# Patient Record
Sex: Female | Born: 1935 | Race: White | Hispanic: No | Marital: Married | Smoking: Never smoker
Health system: Southern US, Community
[De-identification: ages and names within clinical notes are randomized; demographics above are authoritative.]

## PROBLEM LIST (undated history)

## (undated) DIAGNOSIS — I5189 Other ill-defined heart diseases: Secondary | ICD-10-CM

## (undated) DIAGNOSIS — I2510 Atherosclerotic heart disease of native coronary artery without angina pectoris: Secondary | ICD-10-CM

## (undated) DIAGNOSIS — Z7901 Long term (current) use of anticoagulants: Secondary | ICD-10-CM

## (undated) DIAGNOSIS — I252 Old myocardial infarction: Secondary | ICD-10-CM

## (undated) DIAGNOSIS — E785 Hyperlipidemia, unspecified: Secondary | ICD-10-CM

## (undated) DIAGNOSIS — I272 Pulmonary hypertension, unspecified: Secondary | ICD-10-CM

## (undated) DIAGNOSIS — R609 Edema, unspecified: Secondary | ICD-10-CM

## (undated) DIAGNOSIS — Z9229 Personal history of other drug therapy: Secondary | ICD-10-CM

## (undated) DIAGNOSIS — C4490 Unspecified malignant neoplasm of skin, unspecified: Secondary | ICD-10-CM

## (undated) DIAGNOSIS — Z951 Presence of aortocoronary bypass graft: Secondary | ICD-10-CM

## (undated) DIAGNOSIS — I480 Paroxysmal atrial fibrillation: Secondary | ICD-10-CM

## (undated) HISTORY — DX: Paroxysmal atrial fibrillation: I48.0

## (undated) HISTORY — DX: Edema, unspecified: R60.9

## (undated) HISTORY — DX: Personal history of other drug therapy: Z92.29

## (undated) HISTORY — DX: Other ill-defined heart diseases: I51.89

## (undated) HISTORY — DX: Old myocardial infarction: I25.2

## (undated) HISTORY — DX: Atherosclerotic heart disease of native coronary artery without angina pectoris: I25.10

## (undated) HISTORY — DX: Hyperlipidemia, unspecified: E78.5

## (undated) HISTORY — DX: Pulmonary hypertension, unspecified: I27.20

## (undated) HISTORY — DX: Long term (current) use of anticoagulants: Z79.01

## (undated) HISTORY — DX: Unspecified malignant neoplasm of skin, unspecified: C44.90

## (undated) HISTORY — DX: Presence of aortocoronary bypass graft: Z95.1

---

## 1959-04-06 HISTORY — PX: TUBAL LIGATION: SHX77

## 1993-04-05 DIAGNOSIS — I252 Old myocardial infarction: Secondary | ICD-10-CM

## 1993-04-05 HISTORY — DX: Old myocardial infarction: I25.2

## 1993-04-05 HISTORY — PX: CARDIAC CATHETERIZATION: SHX172

## 1997-04-05 HISTORY — PX: OTHER SURGICAL HISTORY: SHX169

## 1997-07-06 ENCOUNTER — Encounter (HOSPITAL_COMMUNITY): Admission: RE | Admit: 1997-07-06 | Discharge: 1997-10-04 | Payer: Self-pay | Admitting: Cardiology

## 1998-04-05 DIAGNOSIS — Z951 Presence of aortocoronary bypass graft: Secondary | ICD-10-CM

## 1998-04-05 HISTORY — DX: Presence of aortocoronary bypass graft: Z95.1

## 1998-04-05 HISTORY — PX: CORONARY ARTERY BYPASS GRAFT: SHX141

## 1998-04-24 ENCOUNTER — Other Ambulatory Visit: Admission: RE | Admit: 1998-04-24 | Discharge: 1998-04-24 | Payer: Self-pay | Admitting: *Deleted

## 1998-05-08 ENCOUNTER — Inpatient Hospital Stay (HOSPITAL_COMMUNITY): Admission: AD | Admit: 1998-05-08 | Discharge: 1998-05-14 | Payer: Self-pay | Admitting: Cardiology

## 1998-05-08 ENCOUNTER — Encounter: Payer: Self-pay | Admitting: Cardiology

## 1998-05-09 ENCOUNTER — Encounter: Payer: Self-pay | Admitting: Cardiology

## 1998-05-10 ENCOUNTER — Encounter: Payer: Self-pay | Admitting: Cardiology

## 1998-05-11 ENCOUNTER — Encounter: Payer: Self-pay | Admitting: Cardiothoracic Surgery

## 1998-05-12 ENCOUNTER — Encounter: Payer: Self-pay | Admitting: Cardiothoracic Surgery

## 1998-06-02 ENCOUNTER — Encounter: Admission: RE | Admit: 1998-06-02 | Discharge: 1998-08-31 | Payer: Self-pay | Admitting: Cardiology

## 1999-04-07 ENCOUNTER — Encounter (HOSPITAL_COMMUNITY): Admission: RE | Admit: 1999-04-07 | Discharge: 1999-07-06 | Payer: Self-pay | Admitting: Cardiology

## 1999-05-07 ENCOUNTER — Encounter: Admission: RE | Admit: 1999-05-07 | Discharge: 1999-05-07 | Payer: Self-pay | Admitting: *Deleted

## 1999-05-07 ENCOUNTER — Encounter: Payer: Self-pay | Admitting: *Deleted

## 1999-08-20 ENCOUNTER — Encounter: Admission: RE | Admit: 1999-08-20 | Discharge: 1999-11-18 | Payer: Self-pay | Admitting: Cardiology

## 2000-05-09 ENCOUNTER — Encounter: Admission: RE | Admit: 2000-05-09 | Discharge: 2000-05-09 | Payer: Self-pay | Admitting: *Deleted

## 2000-05-09 ENCOUNTER — Encounter: Payer: Self-pay | Admitting: *Deleted

## 2001-06-20 ENCOUNTER — Encounter: Payer: Self-pay | Admitting: *Deleted

## 2001-06-20 ENCOUNTER — Encounter: Admission: RE | Admit: 2001-06-20 | Discharge: 2001-06-20 | Payer: Self-pay | Admitting: *Deleted

## 2001-07-12 ENCOUNTER — Observation Stay (HOSPITAL_COMMUNITY): Admission: EM | Admit: 2001-07-12 | Discharge: 2001-07-13 | Payer: Self-pay

## 2001-07-12 ENCOUNTER — Encounter: Payer: Self-pay | Admitting: Emergency Medicine

## 2002-07-31 ENCOUNTER — Other Ambulatory Visit: Admission: RE | Admit: 2002-07-31 | Discharge: 2002-07-31 | Payer: Self-pay | Admitting: *Deleted

## 2002-08-06 ENCOUNTER — Encounter: Payer: Self-pay | Admitting: Cardiology

## 2002-08-06 ENCOUNTER — Encounter: Admission: RE | Admit: 2002-08-06 | Discharge: 2002-08-06 | Payer: Self-pay | Admitting: Cardiology

## 2003-01-30 ENCOUNTER — Encounter: Payer: Self-pay | Admitting: Cardiology

## 2003-07-15 ENCOUNTER — Other Ambulatory Visit: Admission: RE | Admit: 2003-07-15 | Discharge: 2003-07-15 | Payer: Self-pay | Admitting: *Deleted

## 2003-12-18 ENCOUNTER — Ambulatory Visit (HOSPITAL_COMMUNITY): Admission: RE | Admit: 2003-12-18 | Discharge: 2003-12-18 | Payer: Self-pay | Admitting: *Deleted

## 2003-12-20 ENCOUNTER — Encounter: Admission: RE | Admit: 2003-12-20 | Discharge: 2003-12-20 | Payer: Self-pay | Admitting: *Deleted

## 2004-04-14 ENCOUNTER — Encounter: Admission: RE | Admit: 2004-04-14 | Discharge: 2004-04-14 | Payer: Self-pay | Admitting: Internal Medicine

## 2004-04-15 ENCOUNTER — Encounter: Payer: Self-pay | Admitting: Cardiology

## 2004-09-15 ENCOUNTER — Ambulatory Visit: Payer: Self-pay | Admitting: Internal Medicine

## 2005-04-29 ENCOUNTER — Ambulatory Visit (HOSPITAL_COMMUNITY): Admission: RE | Admit: 2005-04-29 | Discharge: 2005-04-29 | Payer: Self-pay | Admitting: *Deleted

## 2005-06-15 ENCOUNTER — Encounter: Payer: Self-pay | Admitting: Cardiology

## 2005-07-09 ENCOUNTER — Emergency Department (HOSPITAL_COMMUNITY): Admission: EM | Admit: 2005-07-09 | Discharge: 2005-07-09 | Payer: Self-pay | Admitting: Emergency Medicine

## 2005-11-24 ENCOUNTER — Other Ambulatory Visit: Admission: RE | Admit: 2005-11-24 | Discharge: 2005-11-24 | Payer: Self-pay | Admitting: *Deleted

## 2006-04-26 ENCOUNTER — Inpatient Hospital Stay (HOSPITAL_COMMUNITY): Admission: AD | Admit: 2006-04-26 | Discharge: 2006-04-27 | Payer: Self-pay | Admitting: Internal Medicine

## 2006-11-30 ENCOUNTER — Encounter: Payer: Self-pay | Admitting: Cardiology

## 2007-01-30 ENCOUNTER — Other Ambulatory Visit: Admission: RE | Admit: 2007-01-30 | Discharge: 2007-01-30 | Payer: Self-pay | Admitting: *Deleted

## 2007-06-06 ENCOUNTER — Ambulatory Visit (HOSPITAL_COMMUNITY): Admission: RE | Admit: 2007-06-06 | Discharge: 2007-06-06 | Payer: Self-pay | Admitting: *Deleted

## 2007-07-13 ENCOUNTER — Ambulatory Visit (HOSPITAL_COMMUNITY): Admission: RE | Admit: 2007-07-13 | Discharge: 2007-07-13 | Payer: Self-pay | Admitting: Neurology

## 2007-08-02 ENCOUNTER — Encounter: Payer: Self-pay | Admitting: Cardiology

## 2008-08-06 ENCOUNTER — Ambulatory Visit (HOSPITAL_COMMUNITY): Admission: RE | Admit: 2008-08-06 | Discharge: 2008-08-06 | Payer: Self-pay | Admitting: Internal Medicine

## 2009-11-24 ENCOUNTER — Ambulatory Visit: Payer: Self-pay | Admitting: Cardiology

## 2009-12-25 ENCOUNTER — Ambulatory Visit: Payer: Self-pay | Admitting: Cardiology

## 2010-01-06 ENCOUNTER — Ambulatory Visit: Payer: Self-pay | Admitting: Cardiology

## 2010-02-18 ENCOUNTER — Ambulatory Visit: Payer: Self-pay | Admitting: Cardiology

## 2010-03-20 ENCOUNTER — Ambulatory Visit: Payer: Self-pay | Admitting: Cardiology

## 2010-04-23 ENCOUNTER — Ambulatory Visit: Payer: Self-pay | Admitting: Cardiology

## 2010-05-25 ENCOUNTER — Encounter (INDEPENDENT_AMBULATORY_CARE_PROVIDER_SITE_OTHER): Payer: MEDICARE

## 2010-05-25 DIAGNOSIS — Z7901 Long term (current) use of anticoagulants: Secondary | ICD-10-CM

## 2010-05-25 DIAGNOSIS — I4891 Unspecified atrial fibrillation: Secondary | ICD-10-CM

## 2010-06-02 ENCOUNTER — Encounter (INDEPENDENT_AMBULATORY_CARE_PROVIDER_SITE_OTHER): Payer: MEDICARE

## 2010-06-02 DIAGNOSIS — Z7901 Long term (current) use of anticoagulants: Secondary | ICD-10-CM

## 2010-06-02 DIAGNOSIS — I4891 Unspecified atrial fibrillation: Secondary | ICD-10-CM

## 2010-06-08 ENCOUNTER — Encounter (INDEPENDENT_AMBULATORY_CARE_PROVIDER_SITE_OTHER): Payer: MEDICARE

## 2010-06-08 DIAGNOSIS — I4891 Unspecified atrial fibrillation: Secondary | ICD-10-CM

## 2010-06-08 DIAGNOSIS — Z7901 Long term (current) use of anticoagulants: Secondary | ICD-10-CM

## 2010-06-22 ENCOUNTER — Encounter (INDEPENDENT_AMBULATORY_CARE_PROVIDER_SITE_OTHER): Payer: MEDICARE

## 2010-06-22 DIAGNOSIS — Z7901 Long term (current) use of anticoagulants: Secondary | ICD-10-CM

## 2010-06-22 DIAGNOSIS — I4892 Unspecified atrial flutter: Secondary | ICD-10-CM

## 2010-07-06 ENCOUNTER — Encounter: Payer: MEDICARE | Admitting: *Deleted

## 2010-07-07 ENCOUNTER — Ambulatory Visit (INDEPENDENT_AMBULATORY_CARE_PROVIDER_SITE_OTHER): Payer: MEDICARE | Admitting: *Deleted

## 2010-07-07 DIAGNOSIS — Z7901 Long term (current) use of anticoagulants: Secondary | ICD-10-CM

## 2010-07-07 DIAGNOSIS — I4891 Unspecified atrial fibrillation: Secondary | ICD-10-CM

## 2010-07-07 LAB — POCT INR: INR: 2.2

## 2010-07-16 ENCOUNTER — Other Ambulatory Visit: Payer: Self-pay | Admitting: Cardiology

## 2010-07-16 ENCOUNTER — Encounter: Payer: Self-pay | Admitting: Cardiology

## 2010-07-16 DIAGNOSIS — I272 Pulmonary hypertension, unspecified: Secondary | ICD-10-CM | POA: Insufficient documentation

## 2010-07-16 DIAGNOSIS — E785 Hyperlipidemia, unspecified: Secondary | ICD-10-CM | POA: Insufficient documentation

## 2010-07-16 DIAGNOSIS — I5189 Other ill-defined heart diseases: Secondary | ICD-10-CM | POA: Insufficient documentation

## 2010-07-16 DIAGNOSIS — I4891 Unspecified atrial fibrillation: Secondary | ICD-10-CM | POA: Insufficient documentation

## 2010-07-16 DIAGNOSIS — I2510 Atherosclerotic heart disease of native coronary artery without angina pectoris: Secondary | ICD-10-CM | POA: Insufficient documentation

## 2010-07-16 DIAGNOSIS — IMO0001 Reserved for inherently not codable concepts without codable children: Secondary | ICD-10-CM | POA: Insufficient documentation

## 2010-07-16 DIAGNOSIS — I259 Chronic ischemic heart disease, unspecified: Secondary | ICD-10-CM | POA: Insufficient documentation

## 2010-07-16 DIAGNOSIS — Z7901 Long term (current) use of anticoagulants: Secondary | ICD-10-CM | POA: Insufficient documentation

## 2010-07-22 NOTE — Telephone Encounter (Signed)
Refill per escribe request.  

## 2010-07-28 ENCOUNTER — Ambulatory Visit: Payer: MEDICARE | Admitting: Cardiology

## 2010-07-28 ENCOUNTER — Ambulatory Visit (INDEPENDENT_AMBULATORY_CARE_PROVIDER_SITE_OTHER): Payer: MEDICARE | Admitting: *Deleted

## 2010-07-28 DIAGNOSIS — I4891 Unspecified atrial fibrillation: Secondary | ICD-10-CM

## 2010-08-05 ENCOUNTER — Encounter: Payer: Self-pay | Admitting: Gastroenterology

## 2010-08-18 ENCOUNTER — Telehealth: Payer: Self-pay | Admitting: Cardiology

## 2010-08-18 NOTE — Telephone Encounter (Signed)
Filled out for patient and advised husband

## 2010-08-18 NOTE — Telephone Encounter (Signed)
Called in because she lost the note Dr. Patty Sermons gave her to get a handicapped notice to hang in her car and was wondering if she could get another one. Also was wondering if she could get it today. Please call back. I have pulled the chart.

## 2010-08-21 NOTE — H&P (Signed)
Karen Arroyo, Karen Arroyo                  ACCOUNT NO.:  [id number]   MEDICAL RECORD NO.:  [id number]          PATIENT TYPE:  INP   LOCATION:  2623                         FACILITY:  [facility]   PHYSICIAN:  Mark A. Perini, M.D.   DATE OF BIRTH:  [birth year]   DATE OF ADMISSION:  04/26/2006  DATE OF DISCHARGE:                              HISTORY & PHYSICAL   ADMISSION HISTORY AND PHYSICAL   CHIEF COMPLAINT:  Syncope.   HISTORY OF PRESENT ILLNESS:  Karen Arroyo is a pleasant 77 year old female  with a past medical history significant for coronary disease, type 2  diabetes and hypertension.  She reports that she was recently diagnosed  with an episode of diverticulitis on April 14, 2006.  She has recently  finished a course of Cipro and Flagyl for this with improvement in her  bowel function basically back to normal.  However, 3 days ago she was  standing talking to her husband when she felt like she was going to  black out.  She did fall back and fell against some furniture.  He was  able to grab her and sit her down in a chair and then she came around  and was normal at that point.  Then again early this morning at  approximately 3:00 a.m. her husband woke up and saw that she was not in  the bed.  She was seen lying on the floor in the bedroom passed out and  unconscious.  He aroused her.  There was no seizure activity noted.  This was an un-witnessed event and the patient has no memory of the  event.  She does not feel as though she has had any injury as she has no  head pain or neck pain or any other musculoskeletal complaints  currently.  She did have some difficulty remembering things for about an  hour and then she returned to a normal level of consciousness.  She  denies any new or different chest pains.  She denies any new  palpitations.  She occasionally feels her heart beats fast but this has  been going on for a long time and has been very rare in occasion.  She  has not taken any  nitroglycerin lately.  In the office her blood  pressure was 82/54 and on orthostatic check she did drop 17 points  systolic from lying to sitting and she is therefore admitted for further  evaluation of syncope and pre-syncope.   PAST MEDICAL HISTORY:  1. Atherosclerotic coronary disease status post coronary artery bypass      grafting in the year 2000.  2. Seasonal allergic rhinitis.  3. Hysterectomy and oophorectomy in 1999.  4. Type 2 diabetes since 1998.  5. Hypertension.  6. Family history of osteoporosis.  7. Hyperlipidemia.  8. History of colon polyps.  9. Congestive heart failure with diastolic dysfunction and by March      2007 echocardiogram she had mild to moderate mitral regurgitation      and severe tricuspid regurgitation and mild pulmonary hypertension.  10.Kidney stone 2007.  11.In November 2007 she  had some cirrhotic changes in her liver on CT      scan; however, she has had no clinical evidence of cirrhosis.  12.Lymphedema.   ALLERGIES:  PENICILLIN CAUSES A RASH.  ASPIRIN AT HIGH DOSES CAUSES A  RASH, but she tolerates 81 mg aspirin once a day without problem, and  NSAIDs HAVE GIVEN HER PROBLEMS IN THE PAST.   CURRENT MEDICATIONS:  Include furosemide 80 mg twice daily.  Amaryl 2 mg 1/2 tablet daily.  Lotensin 20 mg twice a day.  Lopressor 50 mg once daily.  Allegra 60 mg as needed.  Isosorbide 60 mg as needed, but she states she has not been on this  lately.  Lipitor 10 mg daily.  Aspirin 81 mg daily.  Lanoxin 1/2 of a 0.25 mg tablet daily.  Actos 1/2 of a 45 mg tablet daily.  Metformin 1000 mg twice a day.  Caltrate 600 mg with D 1 daily.  Cod liver oil daily.  Boniva 150 mg once a month.  Restoril 15-30 mg each evening as needed.  Nitroglycerin 0.4 mg sublingual as needed.   SOCIAL HISTORY:  She is married and her husband is with her today.  No  tobacco history.  No significant alcohol or drug use history.   FAMILY HISTORY:  Father died at age 68  recently.  Mother died at age 79  of coronary disease.  Mother has a family history of type 2 diabetes and  multiple sclerosis.   REVIEW OF SYSTEMS:  As per the History of Present Illness.  She denies  any fevers.  She denies any blood from above or below.  Her last BUN and  creatinine were 10 and 0.9 on April 20, 2006.  She has had no  significant edema recently.   PHYSICAL EXAMINATION:  Weight is 136 pounds, which is down 6 pounds from  her last visit 4 days ago.  Blood pressure 82/54 and then lying it was  98/64, sitting it was 82/58.  Pulse remained at 64.  She is in no acute  distress.  No pallor or icterus.  Normocephalic, atraumatic.  Oropharynx  is clear.  Lungs are clear to auscultation bilaterally with no wheezes,  rales or rhonchi.  Heart is regular rate and rhythm with no significant  murmur detected.  There is no peripheral edema.  The abdomen is soft and  nontender, nondistended with no masses or hepatosplenomegaly.  There is  no lymphadenopathy.  Skin is warm and dry.   LABORATORY DATA:  EKG has an unusual P wave morphology but appears to be  normal sinus rhythm with a rate of 66 beats per minute.  She has a  normal axis.  She has a first degree AV block.  Normal R wave  progression.  No pathologic Q waves and nonspecific T wave flattening in  the inferior leads.  Compared to a 2004 EKG, her PR interval is  significantly longer at this time but otherwise there are no significant  differences.  Other laboratory data is pending at this time.   ASSESSMENT AND PLAN:  Pre-syncope and one episode of syncope.  The  differential includes orthostasis.  It may be that she is on a bit too  much medication and diuretics at this time.  We will reduce these doses  and monitor her, checking daily orthostatic blood pressure and pulse  checks.  This also could be due to anemia or dysrhythmia.  We will place her on a telemetry bed.  We  will rule out for myocardial infarction with   serial enzymes.  We will ask for a Cardiology consultation.  We will  place her on Lantus and sliding scale insulin for her diabetes.  She  does have a history of chronic lymphedema with swelling in her legs as  well as the diagnosis of diastolic dysfunction and  congestive heart failure and therefore it may be better for her to  discontinue the Actos and metformin; however, she is extremely reluctant  to do insulin as she is very much afraid of needles.  The issue of what  her long term diabetic regimen will be will be deferred to her primary  care physician.  She is a full code status.           ______________________________  Redge Gainer Waynard Edwards, M.D.     MAP/MEDQ  D:  04/26/2006  T:  04/26/2006  Job:  [id number]

## 2010-08-21 NOTE — Discharge Summary (Signed)
Karen Arroyo, Karen Arroyo                  ACCOUNT NO.:  [id number]   MEDICAL RECORD NO.:  [id number]          PATIENT TYPE:  INP   LOCATION:  2623                         FACILITY:  [facility]   PHYSICIAN:  Larina Earthly, M.D.        DATE OF BIRTH:  [birth year]   DATE OF ADMISSION:  04/26/2006  DATE OF DISCHARGE:  04/27/2006                               DISCHARGE SUMMARY   DISCHARGE DIAGNOSES:  1. Presyncope, presumed secondary to dehydration with recent      diverticulitis treatment.  2. History of congestive heart failure with diastolic dysfunction.  3. Chronic lymphedema of the lower extremities.  4. Recent diverticulitis treated with outpatient Cipro and Flagyl.  5. Atherosclerotic coronary artery disease status post bypass grafting      in the year 2000.  6. Type 2 diabetes mellitus.  7. Hypertension   DISCHARGE MEDICATIONS:  The same as that dictated on the history and  physical from April 26, 2006 with the exception that the patient is to  hold her Lasix until Friday of the week of discharge and then restart it  at a lower dose of 40 mg twice a day.   STUDIES:  A 2-D echocardiogram on April 27, 2006 revealed left  ventricular systolic function lower limits of normal with an ejection  fraction estimated to be 50% with no regional wall motion abnormalities,  moderate tricuspid valve regurgitation, right atrium mildly dilated.   LABORATORY EVALUATION:  White blood cell count 9.8, hemoglobin 13.0,  hematocrit 38%, platelet count 312, PT 14.2 seconds, PTT 30 seconds.  Sodium 139, potassium 3.6, BUN 18, creatinine 1.2, glucose 104.  Liver  function tests normal with the exception of an AST of 40, hemoglobin A1c  6.4%, CT of 189, CK-MB 2.3, troponin I 0.01.  Digoxin level 0.5.   HISTORY OF PRESENT ILLNESS:  Karen Arroyo is a 77 year old Caucasian female  with a past medical history as stated above, which also includes type 2  diabetes, hypertension and coronary artery disease.  She was  diagnosed  with a episode of diverticulitis on April 14, 2006 and recently  finished a course of Cipro and Flagyl with improvement in her bowel  function and resolution of her GI symptoms.  However, she continued her  normal medications which included extensive diuretics, given her chronic  lymphedema and congestive heart failure with diastolic dysfunction.  As  stated in the history and the physical, she did have a couple of  episodes of presyncope and one episode of syncope with no apparent  seizure activity.  She presented to our office where my partner, Dr.  Rodrigo Ran, noted that her blood pressure was 82/54, and her  orthostatic check did see a drop of 17 points in her systolic blood  pressure going from lying to sitting.  She is, therefore, evaluated for  further evaluation and management.  Cardiology was also consulted.   HOSPITAL COURSE:  Within 24 hours, the patient was longer orthostatic  after IV fluids were administered and diuretics were held.  Her blood  pressure continued to be low  at 95/58; however, she was no longer  orthostatic when measured by the staff.  Indeed her lying blood pressure  was 108/71.  Her standing blood pressure was 99/46.  Sitting, she was  107/48.  Given her marginal number, she was continued on IV fluids but  clinically felt much better.  Her cardiac enzymes were unremarkable.  Hemoglobin A1c was unremarkable.  Her echocardiogram, as mentioned  above, was unremarkable.  Dr. Ronny Flurry did see the patient from  cardiology, and given the fact that her rhythm was stable, he also  suspected that the patient was suffering from fluid loss secondary to  her recent diarrhea and  diverticulitis associated with volume depletion.  Given the fact that  the echocardiogram was benign, the patient was discharged later on  April 06, 2006 with recommendations to hold her diuretics for the rest  of the week and resume at a lower dose with close follow-up by  myself  and Dr. Patty Sermons.      Larina Earthly, M.D.  Electronically Signed     RA/MEDQ  D:  07/06/2006  T:  07/06/2006  Job:  [id number]

## 2010-08-21 NOTE — H&P (Signed)
. [facility]  Patient:    Karen Arroyo, Karen Arroyo Visit Number: [id number] MRN: [id number]          Service Type: OBV Location: 4700 [id number] Attending Physician:  Rudean Hitt Dictated by:   Alvia Grove., M.D. Admit Date:  07/12/2001   CC:         Thomas A. Patty Sermons, M.D.   History and Physical  TWENTY-THREE-HOUR OBSERVATION  HISTORY OF PRESENT ILLNESS:  Karen Arroyo is a 77 year old female with a history of coronary artery disease -- status post coronary artery bypass grafting.  She is admitted for 23-hour observation after having an episode of chest pain.  The patient has a history of CAD -- status post CABG in 2000.  She has not had any significant episodes of angina since that time.  Today and yesterday she had a bandlike episode of chest pain around her chest.  It was partially relieved at home with both nitroglycerin spray.  It was completely relieved here with a sublingual nitroglycerin.  She is admitted for further evaluation.  The patient has several risk factors for coronary artery disease including hypertension, diabetes mellitus and hyperlipidemia.  CURRENT MEDICATIONS:  1. Lasix 20 mg p.o. b.i.d.  2. Lopressor 25 mg p.o. b.i.d.  3. Lanoxin 0.25 mg a day.  4. Lotensin 20 mg a day.  5. Ziac 2.5/6.25 mg a day.  6. Allegra 60 mg p.o. b.i.d.  7. Enteric-coated aspirin 81 mg a day.  8. Lipitor 20 mg a day.  9. Actos 45 mg a day. 10. Beconase inhaler two puffs b.i.d. 11. Glucophage 500 mg twice a day. 12. Tylenol as needed.  ALLERGIES:  She is allergic to PENICILLIN.  PAST MEDICAL HISTORY:  1. Coronary artery disease.  2. Hyperlipidemia.  3. Diabetes mellitus.  SOCIAL HISTORY:  The patient is a nonsmoker.  FAMILY HISTORY:  Negative.  REVIEW OF SYSTEMS:  Negative.  PHYSICAL EXAMINATION:  GENERAL:  On exam, she is a middle-aged female in no acute distress.  She is currently pain-free.  She is alert and oriented  x3 and her mood and affect are normal.  VITAL SIGNS:  Her blood pressure is 120/70 with a heart rate of 60.  HEENT/NECK:  Exam reveals 2+ carotids.  She has no bruits.  There is no JVD. There is no thyromegaly.  LUNGS:  Clear to auscultation.  HEART:  Regular.  She has no murmurs, gallops or rubs.  ABDOMEN:  Exam reveals good bowel sounds and nontender.  EXTREMITIES:  She has no clubbing, cyanosis or edema.  Her calves are nontender.  NEUROLOGIC:  Exam reveals cranial nerves II-XII are intact and motor and sensory function are intact.  LABORATORY AND ACCESSORY DATA:  Her EKG revealed sinus bradycardia.  There are nonspecific ST and T wave abnormalities.  Her laboratory data include a CPK of 31, troponin of 0.01.  Her CBC is within normal limits.  ASSESSMENT AND PLAN:  Ms. Spilde presents with an episode of angina.  It was relieved with sublingual nitroglycerin x2.  We initially had started a nitroglycerin drip but discontinued it fairly quickly.  She remains very stable.  I have advised her to stay overnight for 23-hour observation.  We will check cardiac enzymes and electrocardiogram in the morning.  If she remains stable, we will be able to discharge her to home. Dictated by:   Alvia Grove., M.D. Attending Physician:  Rudean Hitt DD:  07/12/01 TD:  07/13/01 Job:  [id number]

## 2010-08-26 ENCOUNTER — Encounter: Payer: MEDICARE | Admitting: *Deleted

## 2010-08-28 ENCOUNTER — Encounter (INDEPENDENT_AMBULATORY_CARE_PROVIDER_SITE_OTHER): Payer: Medicare Other | Admitting: *Deleted

## 2010-08-28 DIAGNOSIS — I4891 Unspecified atrial fibrillation: Secondary | ICD-10-CM

## 2010-10-11 ENCOUNTER — Other Ambulatory Visit: Payer: Self-pay | Admitting: Cardiology

## 2010-10-11 DIAGNOSIS — I519 Heart disease, unspecified: Secondary | ICD-10-CM

## 2010-10-12 ENCOUNTER — Encounter: Payer: Medicare Other | Admitting: *Deleted

## 2010-10-12 NOTE — Telephone Encounter (Signed)
Refilled metoprolol to [id number]-

## 2010-10-16 ENCOUNTER — Telehealth: Payer: Self-pay | Admitting: Cardiology

## 2010-10-16 NOTE — Telephone Encounter (Signed)
Scheduled appointment with Lawson Fiscal for shortness of breath on monday

## 2010-10-16 NOTE — Telephone Encounter (Signed)
Patient knows that Dr.Brackbill does not have any openings until Oct.but she said that she's been feeling weak lately and thinks that she should see Dr.Brackbill sooner. She wants to speak w/RN regarding her current Sx's.

## 2010-10-19 ENCOUNTER — Encounter: Payer: Self-pay | Admitting: Nurse Practitioner

## 2010-10-19 ENCOUNTER — Ambulatory Visit (INDEPENDENT_AMBULATORY_CARE_PROVIDER_SITE_OTHER): Payer: Medicare Other | Admitting: Nurse Practitioner

## 2010-10-19 ENCOUNTER — Ambulatory Visit (INDEPENDENT_AMBULATORY_CARE_PROVIDER_SITE_OTHER): Payer: Medicare Other | Admitting: *Deleted

## 2010-10-19 VITALS — BP 98/42 | HR 60 | Ht 64.5 in | Wt 142.2 lb

## 2010-10-19 DIAGNOSIS — R079 Chest pain, unspecified: Secondary | ICD-10-CM

## 2010-10-19 DIAGNOSIS — I519 Heart disease, unspecified: Secondary | ICD-10-CM

## 2010-10-19 DIAGNOSIS — I4891 Unspecified atrial fibrillation: Secondary | ICD-10-CM

## 2010-10-19 DIAGNOSIS — R5383 Other fatigue: Secondary | ICD-10-CM

## 2010-10-19 DIAGNOSIS — R5381 Other malaise: Secondary | ICD-10-CM

## 2010-10-19 DIAGNOSIS — I2510 Atherosclerotic heart disease of native coronary artery without angina pectoris: Secondary | ICD-10-CM

## 2010-10-19 DIAGNOSIS — I5189 Other ill-defined heart diseases: Secondary | ICD-10-CM

## 2010-10-19 DIAGNOSIS — R609 Edema, unspecified: Secondary | ICD-10-CM

## 2010-10-19 LAB — CBC WITH DIFFERENTIAL/PLATELET
Basophils Absolute: 0 10*3/uL (ref 0.0–0.1)
Basophils Relative: 0.8 % (ref 0.0–3.0)
Eosinophils Absolute: 0.2 10*3/uL (ref 0.0–0.7)
Eosinophils Relative: 2.8 % (ref 0.0–5.0)
HCT: 34.2 % — ABNORMAL LOW (ref 36.0–46.0)
Hemoglobin: 11.3 g/dL — ABNORMAL LOW (ref 12.0–15.0)
Lymphocytes Relative: 21.3 % (ref 12.0–46.0)
Lymphs Abs: 1.2 10*3/uL (ref 0.7–4.0)
MCHC: 33 g/dL (ref 30.0–36.0)
MCV: 83.8 fl (ref 78.0–100.0)
Monocytes Absolute: 0.3 10*3/uL (ref 0.1–1.0)
Monocytes Relative: 5.5 % (ref 3.0–12.0)
Neutro Abs: 4 10*3/uL (ref 1.4–7.7)
Neutrophils Relative %: 69.6 % (ref 43.0–77.0)
Platelets: 171 10*3/uL (ref 150.0–400.0)
RBC: 4.07 Mil/uL (ref 3.87–5.11)
RDW: 17.2 % — ABNORMAL HIGH (ref 11.5–14.6)
WBC: 5.7 10*3/uL (ref 4.5–10.5)

## 2010-10-19 LAB — POCT INR: INR: 2.1

## 2010-10-19 LAB — BASIC METABOLIC PANEL
BUN: 23 mg/dL (ref 6–23)
CO2: 28 mEq/L (ref 19–32)
Calcium: 9.4 mg/dL (ref 8.4–10.5)
Chloride: 97 mEq/L (ref 96–112)
Creatinine, Ser: 0.9 mg/dL (ref 0.4–1.2)
GFR: 67.49 mL/min (ref >60.00)
Glucose, Bld: 106 mg/dL — ABNORMAL HIGH (ref 70–99)
Potassium: 3.5 mEq/L (ref 3.5–5.1)
Sodium: 136 mEq/L (ref 135–145)

## 2010-10-19 LAB — TSH: TSH: 1.92 u[IU]/mL (ref 0.35–5.50)

## 2010-10-19 MED ORDER — NITROGLYCERIN 0.4 MG SL SUBL: 0.4000 mg | SUBLINGUAL_TABLET | SUBLINGUAL | Status: DC | PRN

## 2010-10-19 NOTE — Progress Notes (Signed)
[name] Date of Birth: 08/08/1933   History of Present Illness: Karen Arroyo is seen today for a work in visit. She is seen for Dr. Patty Sermons. She is under just an incredible amount of stress, especially over the past 4 to 6 weeks. She has had several deaths in her family and among friends. She wanted to "get checked out". She needs to go to [facility] to manage her aunt's affairs. Her aunt is 79 and recovering from a broken hip. She is worried about making the trip. She reports having more exertional chest discomfort. No real symptoms at rest. She is 12 years out from her CABG. She has not used NTG but "probably should have". She actually swallows her NTG and does not let it dissolve. She does have DOE, especially going up steps and hills. She is fine at rest. She has lost some weight since her last visit. She has chronic edema of her legs and feet. She does not like wearing her support stockings in the summer. Too hot, she says. She is on Actos and has had her dose cut back by Dr. Felipa Eth. She does not check her blood pressure or sugar at home.   Current Outpatient Prescriptions on File Prior to Visit  Medication Sig Dispense Refill  . ACTOS 45 MG tablet TAKE 1/2 TABLET BY MOUTH EVERY DAY  30 tablet  6  . ALPRAZolam (XANAX) 0.25 MG tablet Take 0.25 mg by mouth as needed.        Marland Kitchen aspirin 81 MG chewable tablet Chew 81 mg by mouth daily.        Marland Kitchen atorvastatin (LIPITOR) 10 MG tablet Take 10 mg by mouth daily.        . benazepril (LOTENSIN) 20 MG tablet Take 20 mg by mouth 2 (two) times daily.        . Calcium-Vitamin D (CALTRATE 600 PLUS-VIT D PO) Take 1 tablet by mouth 2 (two) times daily.        . fexofenadine (ALLEGRA) 60 MG tablet Take 60 mg by mouth as needed.        . furosemide (LASIX) 20 MG tablet Take 20 mg by mouth 2 (two) times daily.        Marland Kitchen glimepiride (AMARYL) 2 MG tablet Take 1 mg by mouth daily before breakfast.        . metFORMIN (GLUCOPHAGE-XR) 500 MG 24 hr tablet Take 500 mg by  mouth 2 (two) times daily.        . metoprolol tartrate (LOPRESSOR) 25 MG tablet TAKE 1/2 TABLET TWICE A DAY  30 tablet  11  . Multiple Vitamins-Minerals (MULTIVITAL PO) Take by mouth.        Marland Kitchen omeprazole (PRILOSEC) 20 MG capsule Take 20 mg by mouth 2 (two) times daily.        Marland Kitchen warfarin (COUMADIN) 5 MG tablet Take 5 mg by mouth daily.        Marland Kitchen DISCONTD: metoprolol (LOPRESSOR) 50 MG tablet Take 25 mg by mouth daily.        Marland Kitchen DISCONTD: Multiple Vitamins-Minerals (PRESERVISION AREDS PO) Take by mouth.        . DISCONTD: nitroGLYCERIN (NITROSTAT) 0.4 MG SL tablet Place 0.4 mg under the tongue every 5 (five) minutes as needed.          Allergies  Allergen Reactions  . Ambien   . Aspirin   . Dimetapp Cold-Allergy   . Ilosone (Erythromycin Estolate)   . Iodides   .  Morphine And Related   . Penicillins   . Restoril     Past Medical History  Diagnosis Date  . HX: long term anticoagulant use   . PAF (paroxysmal atrial fibrillation)   . Coronary artery disease   . Diabetes mellitus   . Dyslipidemia   . Pulmonary hypertension   . Diastolic dysfunction   . Skin cancer     nose, leg  . Edema   . MI, old 30    involving small LCX  . S/P CABG (coronary artery bypass graft) 2000    Past Surgical History  Procedure Date  . Coronary artery bypass graft 2000    LIMA to LAD; SVG to intermediate, SVG to PD and RCA per Dr. Tyrone Sage  . Tubal ligation 1961  . Other surgical history 1999    historectomy  . Cardiac catheterization 1995    dr Arlyn Leak    History  Smoking status  . Never Smoker   Smokeless tobacco  . Not on file    History  Alcohol Use No    Family History  Problem Relation Age of Onset  . Heart attack Mother   . Transient ischemic attack Father     Review of Systems: The review of systems is positive for chronic edema, exertional chest pain and DOE. No cough. She remains very busy and active.  All other systems were reviewed and are negative.  Physical  Exam: BP 98/42  Pulse 60  Ht 5' 4.5" (1.638 m)  Wt 142 lb 3.2 oz (64.501 kg)  BMI 24.03 kg/m2 Patient is very pleasant and in no acute distress. Skin is warm and dry. Color is normal.  HEENT is unremarkable. Normocephalic/atraumatic. PERRL. Sclera are nonicteric. Neck is supple. No masses. No JVD. Lungs are clear. Cardiac exam shows a regular rate and rhythm. Abdomen is soft. Extremities are full with 1 to 2+ edema. Gait and ROM are intact. No gross neurologic deficits noted.  LABORATORY DATA:  PENDING   Assessment / Plan:

## 2010-10-19 NOTE — Assessment & Plan Note (Addendum)
Her last stress test was in 2009. She had CABG in 2000. She seems to now be having more angina to me. We will update her stress test. New prescription for NTG was sent to the pharmacy. She is advised to let it dissolve under the tongue and to not swallow. Patient is agreeable to this plan and will call if any problems develop in the interim.

## 2010-10-19 NOTE — Assessment & Plan Note (Signed)
She appears to be in sinus today. She is on Coumadin. INR is therapeutic today. No change in her medicines.

## 2010-10-19 NOTE — Patient Instructions (Addendum)
We are going to update your stress test I have refilled your NTG. Use your NTG under your tongue for recurrent chest pain. May take one tablet every 5 minutes. If you are still having discomfort after 3 tablets in 15 minutes, call 911. We are going to check your labs  You might want to talk with Dr. Felipa Eth about getting off your Actos and see if it helps decrease your swelling.

## 2010-10-19 NOTE — Assessment & Plan Note (Signed)
Last echo was in 2008. She has chronic edema. Her Actos may continue to be contributing to her swelling. She will discuss with Dr. Felipa Eth at her next visit.

## 2010-10-20 ENCOUNTER — Encounter: Payer: Medicare Other | Admitting: *Deleted

## 2010-10-22 ENCOUNTER — Other Ambulatory Visit: Payer: Self-pay | Admitting: Cardiology

## 2010-10-22 DIAGNOSIS — R609 Edema, unspecified: Secondary | ICD-10-CM

## 2010-10-23 ENCOUNTER — Telehealth: Payer: Self-pay | Admitting: *Deleted

## 2010-10-23 NOTE — Progress Notes (Signed)
Advised of labs 

## 2010-10-23 NOTE — Telephone Encounter (Signed)
Advised of labs.  Also states Lasix she takes the Lasix 80 mg daily and has been on this dose, refill requested via escribe.

## 2010-10-23 NOTE — Telephone Encounter (Signed)
Message copied by Burnell Blanks on Fri Oct 23, 2010  4:51 PM ------      Message from: Rosalio Macadamia      Created: Tue Oct 20, 2010 11:10 AM       Ok to report. Labs are satisfactory. She is a little anemic. Will send to her PCP. Thyroid ok. No change in her medicines. Will see what the stress test shows.

## 2010-10-23 NOTE — Telephone Encounter (Signed)
escribe request  

## 2010-10-26 ENCOUNTER — Other Ambulatory Visit (HOSPITAL_COMMUNITY): Payer: Medicare Other | Admitting: Radiology

## 2010-11-05 ENCOUNTER — Encounter: Payer: Self-pay | Admitting: *Deleted

## 2010-11-18 ENCOUNTER — Ambulatory Visit (HOSPITAL_COMMUNITY): Payer: Medicare Other | Attending: Nurse Practitioner | Admitting: Radiology

## 2010-11-18 DIAGNOSIS — R079 Chest pain, unspecified: Secondary | ICD-10-CM | POA: Insufficient documentation

## 2010-11-18 DIAGNOSIS — R0789 Other chest pain: Secondary | ICD-10-CM

## 2010-11-18 DIAGNOSIS — I25810 Atherosclerosis of coronary artery bypass graft(s) without angina pectoris: Secondary | ICD-10-CM

## 2010-11-18 DIAGNOSIS — I4891 Unspecified atrial fibrillation: Secondary | ICD-10-CM

## 2010-11-18 MED ORDER — TECHNETIUM TC 99M TETROFOSMIN IV KIT
11.0000 | PACK | Freq: Once | INTRAVENOUS | Status: AC | PRN
  Administered 2010-11-18: 11 via INTRAVENOUS

## 2010-11-18 MED ORDER — TECHNETIUM TC 99M TETROFOSMIN IV KIT
33.0000 | PACK | Freq: Once | INTRAVENOUS | Status: AC | PRN
  Administered 2010-11-18: 33 via INTRAVENOUS

## 2010-11-18 MED ORDER — REGADENOSON 0.4 MG/5ML IV SOLN
0.4000 mg | Freq: Once | INTRAVENOUS | Status: AC
  Administered 2010-11-18: 0.4 mg via INTRAVENOUS

## 2010-11-18 NOTE — Progress Notes (Signed)
[facility] SITE 3 NUCLEAR MED [address] [place] [address] [phone]  Cardiology Nuclear Med Study  Karen Arroyo is a 77 y.o. female [id number] 21-May-1933   Nuclear Med Background Indication for Stress Test:  Evaluation for Ischemia and Graft Patency History:  '95 MI; '00 CABG; '08 ION:GEXBMW, EF=67%; '08 Echo:EF=50%;  H/O PAF Cardiac Risk Factors: Family History - CAD, Hypertension, Lipids and NIDDM  Symptoms:  Chest Pain/Tightness with and without Exertion (last episode of chest discomfort was last night), Diaphoresis, DOE/SOB, Fatigue, Palpitations, Rapid HR    Nuclear Pre-Procedure Caffeine/Decaff Intake:  None NPO After: 9:00pm   Lungs:  Clear.  O2 sat 99% on RA. IV 0.9% NS with Angio Cath:  22g  IV Site: R Hand  IV Started by:  Bonnita Levan, RN  Chest Size (in): 34 Cup Size: B  Height: 5\' 4"  (1.626 m)  Weight:  140 lb (63.504 kg)  BMI:  Body mass index is 24.03 kg/(m^2). Tech Comments:  Metoprolol held this a.m.; no medications taken today, per patient.    Nuclear Med Study 1 or 2 day study: 1 day  Stress Test Type:  Treadmill/Lexiscan  Reading MD: Olga Millers, MD  Order Authorizing Provider:  Cassell Clement, MD  Resting Radionuclide: Technetium 46m Tetrofosmin  Resting Radionuclide Dose: 11 mCi   Stress Radionuclide:  Technetium 82m Tetrofosmin  Stress Radionuclide Dose: 33 mCi           Stress Protocol Rest HR: 48 Stress HR: 90  Rest BP: 158/81 Stress BP: 198/95  Exercise Time (min): 2:00 METS: n/a   Predicted Max HR: 146 bpm % Max HR: 61.64 bpm Rate Pressure Product: 41324   Dose of Adenosine (mg):  n/a Dose of Lexiscan: 0.4 mg  Dose of Atropine (mg): n/a Dose of Dobutamine: n/a mcg/kg/min (at max HR)  Stress Test Technologist: Smiley Houseman, CMA-N  Nuclear Technologist:  Domenic Polite, CNMT     Rest Procedure:  Myocardial perfusion imaging was performed at rest 45 minutes following the intravenous administration of  Technetium 83m Tetrofosmin.  Rest ECG: Marked sinus bradycardia, IRBBB, nonspecific ST-T changes.  Stress Procedure:  The patient received IV Lexiscan 0.4 mg over 15-seconds with concurrent low level exercise and then Technetium 93m Tetrofosmin was injected at 30-seconds while the patient continued walking one more minute.  There were intermittent episodes of atrial fibrillation and occasional PVC's with Lexiscan.  She did c/o chest tightness, 4/10, with infusion.  Quantitative spect images were obtained after a 45-minute delay.  Stress ECG: Significant ST abnormalities consistent with ischemia.  QPS Raw Data Images:  Acquisition technically good; normal left ventricular size. Stress Images:  There is decreased uptake in the distal anteroseptal wall. Rest Images:  Normal homogeneous uptake in all areas of the myocardium. Subtraction (SDS):  These findings are consistent with ischemia. Transient Ischemic Dilatation (Normal <1.22):  1.02 Lung/Heart Ratio (Normal <0.45):  .37  Quantitative Gated Spect Images QGS EDV:  83 ml QGS ESV:  30 ml QGS cine images:  NL LV Function; NL Wall Motion; RVE. QGS EF: 64%  Impression Exercise Capacity:  Lexiscan with low level exercise. BP Response:  Normal blood pressure response. Clinical Symptoms:  There is chest pain. ECG Impression:  Significant ST abnormalities consistent with ischemia. Comparison with Prior Nuclear Study: No images to compare  Overall Impression:  Abnormal stress nuclear study with mild ischemia in the distal anteroseptal wall.   Olga Millers

## 2010-11-19 NOTE — Progress Notes (Signed)
I gave the patient her report about her recent Cardiolite.  She now has reversible distal anterior wall ischemia which was not present on her previous nuclear stress test of 08/02/07.  I have recommended cardiac catheterization and she is in agreement.  She is on Coumadin.  We will set her up to come in to see Dr. [place] for a precath visit

## 2010-11-19 NOTE — Progress Notes (Signed)
nuc med report routed to Dr. Patty Sermons & Norma Fredrickson 11/19/10 Karen Arroyo

## 2010-11-19 NOTE — Progress Notes (Signed)
Discussed with Synetta Fail and she is going to patient in for pre cath ov with Dr [place]

## 2010-11-20 ENCOUNTER — Other Ambulatory Visit: Payer: Self-pay | Admitting: *Deleted

## 2010-11-20 DIAGNOSIS — K219 Gastro-esophageal reflux disease without esophagitis: Secondary | ICD-10-CM

## 2010-11-20 MED ORDER — OMEPRAZOLE 20 MG PO CPDR: 20.0000 mg | DELAYED_RELEASE_CAPSULE | Freq: Two times a day (BID) | ORAL | Status: DC

## 2010-11-20 NOTE — Telephone Encounter (Signed)
Refilled meds per fax request.  

## 2010-11-25 ENCOUNTER — Encounter: Payer: Self-pay | Admitting: Cardiology

## 2010-11-25 ENCOUNTER — Ambulatory Visit (INDEPENDENT_AMBULATORY_CARE_PROVIDER_SITE_OTHER): Payer: Medicare Other | Admitting: Cardiology

## 2010-11-25 ENCOUNTER — Ambulatory Visit
Admission: RE | Admit: 2010-11-25 | Discharge: 2010-11-25 | Disposition: A | Discharge status: Home / Self Care | Payer: Medicare Other | Source: Ambulatory Visit | Attending: Cardiology | Admitting: Cardiology

## 2010-11-25 DIAGNOSIS — I4891 Unspecified atrial fibrillation: Secondary | ICD-10-CM

## 2010-11-25 DIAGNOSIS — R079 Chest pain, unspecified: Secondary | ICD-10-CM

## 2010-11-25 LAB — BASIC METABOLIC PANEL
Calcium: 9.2 mg/dL (ref 8.4–10.5)
GFR: 72.24 mL/min (ref >60.00)
Sodium: 143 mEq/L (ref 135–145)

## 2010-11-25 LAB — APTT: aPTT: 30.1 s — ABNORMAL HIGH (ref 21.7–28.8)

## 2010-11-25 NOTE — Assessment & Plan Note (Signed)
Recent nuclear stress test is abnormal showing distal anteroseptal ischemia. Given her recent symptoms of exertional chest pain we will proceed with cardiac catheterization. This will be scheduled on Friday, August 24 at 9:30 AM. Procedure was reviewed in detail with the patient including potential risk. We will hold her Coumadin for this procedure. We will check baseline lab work today and chest x-ray.

## 2010-11-25 NOTE — Assessment & Plan Note (Signed)
She is in sinus rhythm today. We will hold her Coumadin for planned invasive procedure.

## 2010-11-25 NOTE — Patient Instructions (Addendum)
We will schedule you for a cardiac cath this Friday.  Stop taking your coumadin.  We will check lab work and a chest Xray today.  Angiography Angiography is a procedure used to look at the blood vessels (arteries) which carry the blood to different parts of your body. In this procedure a dye is injected through a catheter (a long, hollow tube about the size of a piece of cooked spaghetti) into an artery and x-rays are taken. The x-rays will show if there is a blockage or problem in a blood vessel.  PREPARATION FOR THE PROCEDURE  Let your caregiver know if you have had an allergy to dyes used in x-ray if you have ever had kidney problems or failure.   Do not eat or drink starting from midnight up to the time of the procedure, or as directed.   You may drink enough water to take your medications the morning of the procedure if you were instructed to do so.   You should be at the hospital or outpatient facility where the procedure is to be done 11/27/10 at 8:30   prior to the procedure or as directed.  PROCEDURE: 1. You may be given a medication to help you relax before and during the procedure through an IV in your hand or arm.  2. A local anesthetic to make the area numb may be used before inserting the catheter.  3. You will be prepared for the procedure by washing and shaving the area where the catheter will be inserted. This is usually done in the groin but may be done in the fold of your arm by your elbow.  4. A specially trained doctor will insert the catheter with a guide wire into an artery. This is guided under a special type of x-ray (fluoroscopy) to the blood vessel being examined.  5. Special dye is then injected and x-rays are taken. These will show where any narrowing or blockages are located.  AFTER THE PROCEDURE  After the procedure you will be kept in bed for several hours.   The access site will be watched and you will be checked frequently.   Blood tests, other x-rays  and an EKG may be done.   You may stay in the hospital overnight for observation.  SEEK IMMEDIATE MEDICAL CARE IF:  You develop chest pain, shortness of breath, feel faint, or pass out.   There is bleeding, swelling, or drainage from the catheter insertion site.   You develop pain, discoloration, coldness, or severe bruising in the leg or arm, or area where the catheter was inserted.   An oral temperature above 101 develops.  Document Released: 12/30/2004 Document Re-Released: 07/22/2007 [facility] Patient Information 2011 [place], [place].

## 2010-11-25 NOTE — Progress Notes (Signed)
Karen Arroyo Date of Birth: 1933/11/22   History of Present Illness: Karen Arroyo is seen for followup today after recent nuclear stress test.   She reports having more exertional chest discomfort. No real symptoms at rest. She is 12 years out from her CABG. She has not used NTG but "probably should have".  She does have DOE, especially going up steps and hills. She is fine at rest. She has lost some weight since her last visit. She has chronic edema of her legs and feet. She does not like wearing her support stockings in the summer. Too hot, she says. She is on Actos and has had her dose cut back by Dr. Felipa Eth. Her recent nuclear stress test showed evidence of ischemia in the distal anteroseptal wall. Cardiac catheterization has been recommended.  Current Outpatient Prescriptions on File Prior to Visit  Medication Sig Dispense Refill  . ACTOS 45 MG tablet TAKE 1/2 TABLET BY MOUTH EVERY DAY  30 tablet  6  . ALPRAZolam (XANAX) 0.25 MG tablet Take 0.25 mg by mouth as needed.        Marland Kitchen aspirin 81 MG chewable tablet Chew 81 mg by mouth daily.        Marland Kitchen atorvastatin (LIPITOR) 10 MG tablet Take 10 mg by mouth daily.        . benazepril (LOTENSIN) 20 MG tablet Take 20 mg by mouth 2 (two) times daily.        . Calcium-Vitamin D (CALTRATE 600 PLUS-VIT D PO) Take 1 tablet by mouth 2 (two) times daily.        . fexofenadine (ALLEGRA) 60 MG tablet Take 60 mg by mouth as needed.        . furosemide (LASIX) 80 MG tablet Daily or as directed  60 tablet  11  . glimepiride (AMARYL) 2 MG tablet Take 1 mg by mouth daily before breakfast.        . metFORMIN (GLUCOPHAGE-XR) 500 MG 24 hr tablet Take 500 mg by mouth 2 (two) times daily.        . metoprolol tartrate (LOPRESSOR) 25 MG tablet TAKE 1/2 TABLET TWICE A DAY  30 tablet  11  . Multiple Vitamins-Minerals (MULTIVITAL PO) Take by mouth.        . nitroGLYCERIN (NITROSTAT) 0.4 MG SL tablet Place 1 tablet (0.4 mg total) under the tongue every 5 (five) minutes as needed.   25 tablet  6  . omeprazole (PRILOSEC) 20 MG capsule Take 1 capsule (20 mg total) by mouth 2 (two) times daily.  60 capsule  11  . warfarin (COUMADIN) 5 MG tablet Take 5 mg by mouth daily.        . citalopram (CELEXA) 20 MG tablet Take 1 tablet by mouth Daily.        Allergies  Allergen Reactions  . Ambien   . Aspirin   . Dimetapp Cold-Allergy   . Ilosone (Erythromycin Estolate)   . Iodides   . Morphine And Related   . Penicillins   . Restoril     Past Medical History  Diagnosis Date  . HX: long term anticoagulant use   . PAF (paroxysmal atrial fibrillation)   . Coronary artery disease   . Diabetes mellitus   . Dyslipidemia   . Pulmonary hypertension   . Diastolic dysfunction   . Skin cancer     nose, leg  . Edema   . MI, old 49    involving small LCX  . S/P  CABG (coronary artery bypass graft) 2000    Past Surgical History  Procedure Date  . Coronary artery bypass graft 2000    LIMA to LAD; SVG to intermediate, SVG to PD and RCA per Dr. Tyrone Sage  . Tubal ligation 1961  . Other surgical history 1999    historectomy  . Cardiac catheterization 1995    dr Arlyn Leak    History  Smoking status  . Never Smoker   Smokeless tobacco  . Not on file    History  Alcohol Use No    Family History  Problem Relation Age of Onset  . Heart attack Mother   . Transient ischemic attack Father     Review of Systems: The review of systems is positive for chronic edema, exertional chest pain and DOE. No cough. She remains very busy and active. Patient has an iodine allergy listed but in reviewing her extensive medical history I see no mention of an iodine reaction in the past. The patient denies any prior history of reaction. She had prior cardiac catheterization without any difficulty. There is a mention of reaction to Indocin in the past and I suspect that the iodine reaction is in error.All other systems were reviewed and are negative.  Physical Exam: BP 132/68  Pulse 48   Ht 5\' 4"  (1.626 m)  Wt 142 lb (64.411 kg)  BMI 24.37 kg/m2 Patient is very pleasant and in no acute distress. Skin is warm and dry. Color is normal.  HEENT is unremarkable. Normocephalic/atraumatic. PERRL. Sclera are nonicteric. Neck is supple. No masses. No JVD. Lungs are clear. Cardiac exam shows a regular rate and rhythm. Abdomen is soft. Extremities are full with 1 to 2+ edema. Gait and ROM are intact. No gross neurologic deficits noted.  LABORATORY DATA:  ECG demonstrates sinus bradycardia with rightward axis. There is nonspecific ST-T wave abnormality.   Assessment / Plan:

## 2010-11-26 ENCOUNTER — Telehealth: Payer: Self-pay | Admitting: *Deleted

## 2010-11-26 LAB — CBC WITH DIFFERENTIAL/PLATELET
Basophils Relative: 0.4 % (ref 0.0–3.0)
Eosinophils Relative: 3.8 % (ref 0.0–5.0)
HCT: 32.3 % — ABNORMAL LOW (ref 36.0–46.0)
Hemoglobin: 10.6 g/dL — ABNORMAL LOW (ref 12.0–15.0)
Lymphs Abs: 1 10*3/uL (ref 0.7–4.0)
Monocytes Relative: 10.5 % (ref 3.0–12.0)
Neutro Abs: 3 10*3/uL (ref 1.4–7.7)
RBC: 3.87 Mil/uL (ref 3.87–5.11)
RDW: 17.8 % — ABNORMAL HIGH (ref 11.5–14.6)
WBC: 4.6 10*3/uL (ref 4.5–10.5)

## 2010-11-26 NOTE — Telephone Encounter (Signed)
Spoke w/husband. Notified of lab results. Is OK for cath tomorrow,

## 2010-11-26 NOTE — Telephone Encounter (Signed)
Message copied by Lorayne Bender on Thu Nov 26, 2010  3:45 PM ------      Message from: [place], PETER M      Created: Thu Nov 26, 2010  3:31 PM       Labs OK for cath

## 2010-11-27 ENCOUNTER — Inpatient Hospital Stay (HOSPITAL_BASED_OUTPATIENT_CLINIC_OR_DEPARTMENT_OTHER)
Admission: RE | Admit: 2010-11-27 | Discharge: 2010-11-27 | Disposition: A | Discharge status: Home / Self Care | Payer: Medicare Other | Source: Ambulatory Visit | Attending: Cardiology | Admitting: Cardiology

## 2010-11-27 ENCOUNTER — Telehealth: Payer: Self-pay | Admitting: Cardiology

## 2010-11-27 DIAGNOSIS — I2510 Atherosclerotic heart disease of native coronary artery without angina pectoris: Secondary | ICD-10-CM

## 2010-11-27 DIAGNOSIS — R0789 Other chest pain: Secondary | ICD-10-CM | POA: Insufficient documentation

## 2010-11-27 DIAGNOSIS — I059 Rheumatic mitral valve disease, unspecified: Secondary | ICD-10-CM | POA: Insufficient documentation

## 2010-11-27 LAB — POCT I-STAT GLUCOSE
Glucose, Bld: 96 mg/dL (ref 70–99)
Operator id: 194801

## 2010-11-27 NOTE — Telephone Encounter (Signed)
Patient had RHC w/Dr.Jordan and he wanted patient to see Dr.Brackbill sooner than her scheduled appointment on 01/05/11.  Dr.Brackbill does not have an opening. Please advise.

## 2010-11-27 NOTE — Telephone Encounter (Signed)
Called patient and made appt

## 2010-11-30 ENCOUNTER — Encounter: Payer: Self-pay | Admitting: *Deleted

## 2010-11-30 NOTE — Cardiovascular Report (Signed)
  NAMEHELI, DINO NO.:  [id number]  MEDICAL RECORD NO.:  [id number]  LOCATION:  [id number]                     FACILITY:  [facility]  PHYSICIAN:  Kayman Snuffer M. [place], M.D.  DATE OF BIRTH:  Sep 12, 1933  DATE OF PROCEDURE:  11/27/2010 DATE OF DISCHARGE:  11/18/2010                           CARDIAC CATHETERIZATION   INDICATIONS FOR PROCEDURE:  Patient is a 77 year old white female with a history of prior coronary bypass surgery.  She presents recently with an atypical chest pain.  She had a stress nuclear study, which indicated distal anteroseptal ischemia.  PROCEDURES:  Left heart catheterization, left ventricular angiography, saphenous vein graft angiography x2, LIMA graft angiography.  ACCESS:  Via the right femoral artery using standard Seldinger technique equipment, 6-French 4 cm left Judkins catheter, 6-French 3-DRC catheter, 6-French LCB catheter, 6-French LIMA catheter and 6-French pigtail catheter.  MEDICATIONS:  Local anesthesia 1% Xylocaine, Versed 1 mg IV.  CONTRAST:  110 mL of Omnipaque.  DATA:  Aortic pressure was 161/68 with mean of 105 mmHg.  Left ventricle pressure was 166 with the EDP of 24 mmHg.  Angiographic data:  The left coronary arises and distributes normally. The left main coronary is normal.  The left anterior descending artery has an 80% lesion in the midvessel. There is competitive filling of the distal vessel from the IMA graft.  The left circumflex coronary is relatively small vessel that is basically follows in the AV group.  There is a large first marginal branch, which is occluded.  The right coronary is a large dominant vessel.  It is without significant disease.  It has only minor final irregularities throughout. The PDA and the posterolateral branches fill well from the native vessel.  There is a saphenous vein graft to the PDA with extension to the posterolateral branch.  This graft is patent, but there is  significant competitive filling of the vessels from the native right coronary artery.  Saphenous vein graft to large obtuse marginal vessel is widely patent.  The LIMA graft to the LAD is patent with good runoff.  Left ventricular angiography was performed in the RAO view.  Systolic function is normal with ejection fraction estimated at 55%.  There is moderate mitral insufficiency noted.  FINAL INTERPRETATION: 1. Severe two-vessel obstructive atherosclerotic coronary disease. 2. All grafts were patent including saphenous vein graft to the PDA     and posterolateral branch, saphenous vein graft to obtuse marginal     vessel, and LIMA graft to the LAD. 3. Normal left ventricular function. 4. Moderate mitral insufficiency.  PLAN:  We will continue current medical therapy.          ______________________________ Rileyann Florance M. [place], M.D.     PMJ/MEDQ  D:  11/27/2010  T:  11/27/2010  Job:  [id number]  Electronically Signed by Sharne Linders [place] M.D. on 11/30/2010 02:15:02 PM

## 2010-12-01 ENCOUNTER — Encounter: Payer: Self-pay | Admitting: Cardiology

## 2010-12-14 ENCOUNTER — Encounter: Payer: Self-pay | Admitting: Cardiology

## 2010-12-14 ENCOUNTER — Ambulatory Visit (INDEPENDENT_AMBULATORY_CARE_PROVIDER_SITE_OTHER): Payer: Medicare Other | Admitting: Cardiology

## 2010-12-14 DIAGNOSIS — R5383 Other fatigue: Secondary | ICD-10-CM | POA: Insufficient documentation

## 2010-12-14 DIAGNOSIS — R5381 Other malaise: Secondary | ICD-10-CM

## 2010-12-14 DIAGNOSIS — R079 Chest pain, unspecified: Secondary | ICD-10-CM

## 2010-12-14 DIAGNOSIS — I4891 Unspecified atrial fibrillation: Secondary | ICD-10-CM

## 2010-12-14 NOTE — Assessment & Plan Note (Signed)
The patient complains of lack of energy.  She sleeps 10 hours a night.  She does not know whether this is normal for her age whether she should be concerned.  On her cardiac catheterization labs she did have a mild anemia with a hemoglobin of 10.2.  She has not been aware of any hematochezia or melena.  She is taking a multivitamin.  She has an appointment to see Dr. Felipa Eth in about a month and she will ask him to followup on the anemia.  In terms of her fatigue we did have a normal TSH in July 2012.

## 2010-12-14 NOTE — Progress Notes (Signed)
Karen Arroyo Date of Birth:  [birth year] [facility] / [facility] [address]   [address] [place], [place]  [address] [phone]           Fax   [phone]  History of Present Illness: This pleasant 77 year old Caucasian female is seen for a scheduled followup office visit she has known ischemic heart disease.  She had coronary bypass graft surgery in 2000 Dr. Tyrone Sage.  Recently she had some increased chest discomfort which led to a nuclear stress test which suggested ischemia in the distal anteroseptal wall.  She underwent cardiac catheterization by Dr. [place]and on 11/27/10 at which time she was found to have severe 2 vessel obstructive atherosclerotic coronary artery disease and all grafts were patent including the saphenous vein graft to the PDA and posterolateral branch, saphenous vein graft to the obtuse marginal vessel, and LIMA graft to the LAD.  She had normal left ventricular function and she had moderate mitral insufficiency.  Continuing current medical therapy was recommended.  The patient reports that she has had less chest discomfort since her cardiac catheterization.  She takes only an occasional sublingual nitroglycerin.  She does complain of lack of energy and fatigue despite the fact that she sleeps 10 hours a night.  Current Outpatient Prescriptions  Medication Sig Dispense Refill  . ACTOS 45 MG tablet TAKE 1/2 TABLET BY MOUTH EVERY DAY  30 tablet  6  . ALPRAZolam (XANAX) 0.25 MG tablet Take 0.25 mg by mouth as needed.        Marland Kitchen aspirin 81 MG chewable tablet Chew 81 mg by mouth daily.        Marland Kitchen atorvastatin (LIPITOR) 10 MG tablet Take 10 mg by mouth daily.        . benazepril (LOTENSIN) 20 MG tablet Take 20 mg by mouth 2 (two) times daily.        . Calcium-Vitamin D (CALTRATE 600 PLUS-VIT D PO) Take 1 tablet by mouth 2 (two) times daily.        . citalopram (CELEXA) 20 MG tablet Take 1 tablet by mouth Daily.      . fexofenadine (ALLEGRA) 60 MG tablet Take 60 mg by  mouth as needed.        . furosemide (LASIX) 80 MG tablet Daily or as directed  60 tablet  11  . glimepiride (AMARYL) 2 MG tablet Take 1 mg by mouth daily before breakfast.        . metFORMIN (GLUCOPHAGE-XR) 500 MG 24 hr tablet Take 500 mg by mouth 2 (two) times daily.        . metoprolol tartrate (LOPRESSOR) 25 MG tablet TAKE 1/2 TABLET TWICE A DAY  30 tablet  11  . Multiple Vitamins-Minerals (MULTIVITAL PO) Take by mouth.        . nitroGLYCERIN (NITROSTAT) 0.4 MG SL tablet Place 1 tablet (0.4 mg total) under the tongue every 5 (five) minutes as needed.  25 tablet  6  . omeprazole (PRILOSEC) 20 MG capsule Take 1 capsule (20 mg total) by mouth 2 (two) times daily.  60 capsule  11  . warfarin (COUMADIN) 5 MG tablet Take 5 mg by mouth daily.        Marland Kitchen zolpidem (AMBIEN) 5 MG tablet Ad lib.        Allergies  Allergen Reactions  . Ambien   . Aspirin   . Dimetapp Cold-Allergy   . Ilosone (Erythromycin Estolate)   . Morphine And Related   . Penicillins   .  Restoril     Patient Active Problem List  Diagnoses  . Atrial fibrillation  . Coronary artery disease  . Encounter for long-term (current) use of anticoagulants  . Diabetes mellitus  . Dyslipidemia  . Ischemia of heart, chronic  . Diastolic dysfunction  . Pulmonary hypertension  . Chest pain    History  Smoking status  . Never Smoker   Smokeless tobacco  . Not on file    History  Alcohol Use No    Family History  Problem Relation Age of Onset  . Heart attack Mother   . Transient ischemic attack Father     Review of Systems: Constitutional: no fever chills diaphoresis or fatigue or change in weight.  Head and neck: no hearing loss, no epistaxis, no photophobia or visual disturbance. Respiratory: No cough, shortness of breath or wheezing. Cardiovascular: No chest pain peripheral edema, palpitations. Gastrointestinal: No abdominal distention, no abdominal pain, no change in bowel habits hematochezia or  melena. Genitourinary: No dysuria, no frequency, no urgency, no nocturia. Musculoskeletal:No arthralgias, no back pain, no gait disturbance or myalgias. Neurological: No dizziness, no headaches, no numbness, no seizures, no syncope, no weakness, no tremors. Hematologic: No lymphadenopathy, no easy bruising. Psychiatric: No confusion, no hallucinations, no sleep disturbance.    Physical Exam: Filed Vitals:   12/14/10 1540  BP: 116/64  Pulse: 68  The general appearance reveals a well-developed well-nourished slightly pale Caucasian female in no distress.Pupils equal and reactive.   Extraocular Movements are full.  There is no scleral icterus.  The mouth and pharynx are normal.  The neck is supple.  The carotids reveal no bruits.  The jugular venous pressure is normal.  The thyroid is not enlarged.  There is no lymphadenopathy.  The chest is clear to percussion and auscultation. There are no rales or rhonchi. Expansion of the chest is symmetrical.    The heart reveals a grade 2/6 apical murmur of mitral regurgitation. The abdomen is soft and nontender. Bowel sounds are normal. The liver and spleen are not enlarged. There Are no abdominal masses. There are no bruits.  Extremities reveal 2+ ankle edema and good pedal pulses.The skin is warm and dry.  There is no rash.  Strength is normal and symmetrical in all extremities.  There is no lateralizing weakness.  There are no sensory deficits.     Assessment / Plan: Continue present medication.  She will followup with the anemia with her primary care physician.  She has had slight vaginal bleeding and she is post hysterectomy.  She will followup with her gynecologist.  We will plan to recheck a pro time in about one month and see her for a followup office visit in about 3 months

## 2010-12-14 NOTE — Assessment & Plan Note (Signed)
The patient has a history of paroxysmal atrial fibrillation and is on long-term Coumadin.  She has not been aware of any recent episodes of atrial fibrillation.  She's not any thromboembolic episodes.  She's not been aware of any complications from the Coumadin.

## 2010-12-14 NOTE — Assessment & Plan Note (Signed)
Since her cardiac catheterization 2 weeks ago the patient has felt better and has had less chest discomfort.  She has not had to take any recent sublingual nitroglycerin

## 2011-01-05 ENCOUNTER — Ambulatory Visit: Payer: Medicare Other | Admitting: Cardiology

## 2011-01-12 ENCOUNTER — Encounter: Payer: Self-pay | Admitting: Cardiology

## 2011-01-13 ENCOUNTER — Encounter: Payer: Medicare Other | Admitting: *Deleted

## 2011-01-13 ENCOUNTER — Other Ambulatory Visit: Payer: Self-pay | Admitting: Cardiology

## 2011-01-14 ENCOUNTER — Encounter: Payer: Medicare Other | Admitting: *Deleted

## 2011-01-22 ENCOUNTER — Encounter: Payer: Medicare Other | Admitting: *Deleted

## 2011-01-25 ENCOUNTER — Ambulatory Visit (INDEPENDENT_AMBULATORY_CARE_PROVIDER_SITE_OTHER): Payer: Medicare Other | Admitting: *Deleted

## 2011-01-25 DIAGNOSIS — I4891 Unspecified atrial fibrillation: Secondary | ICD-10-CM

## 2011-01-25 MED ORDER — WARFARIN SODIUM 5 MG PO TABS: ORAL_TABLET | ORAL | Status: DC

## 2011-02-04 ENCOUNTER — Other Ambulatory Visit: Payer: Self-pay | Admitting: *Deleted

## 2011-02-04 MED ORDER — ZOLPIDEM TARTRATE 5 MG PO TABS: 5.0000 mg | ORAL_TABLET | Freq: Every evening | ORAL | Status: DC | PRN

## 2011-02-04 NOTE — Telephone Encounter (Signed)
For refill on [place]

## 2011-02-08 ENCOUNTER — Ambulatory Visit (INDEPENDENT_AMBULATORY_CARE_PROVIDER_SITE_OTHER): Payer: Medicare Other | Admitting: *Deleted

## 2011-02-08 ENCOUNTER — Encounter: Payer: Medicare Other | Admitting: *Deleted

## 2011-02-08 DIAGNOSIS — I4891 Unspecified atrial fibrillation: Secondary | ICD-10-CM

## 2011-02-08 DIAGNOSIS — Z7901 Long term (current) use of anticoagulants: Secondary | ICD-10-CM

## 2011-02-17 NOTE — Telephone Encounter (Signed)
Agree with plan 

## 2011-03-08 ENCOUNTER — Ambulatory Visit (INDEPENDENT_AMBULATORY_CARE_PROVIDER_SITE_OTHER): Payer: Medicare Other | Admitting: *Deleted

## 2011-03-08 DIAGNOSIS — Z7901 Long term (current) use of anticoagulants: Secondary | ICD-10-CM

## 2011-03-08 DIAGNOSIS — I4891 Unspecified atrial fibrillation: Secondary | ICD-10-CM

## 2011-03-15 ENCOUNTER — Encounter: Payer: Self-pay | Admitting: Cardiology

## 2011-03-15 ENCOUNTER — Ambulatory Visit (INDEPENDENT_AMBULATORY_CARE_PROVIDER_SITE_OTHER): Payer: Medicare Other | Admitting: Cardiology

## 2011-03-15 VITALS — BP 126/78 | HR 64 | Ht 64.0 in | Wt 143.0 lb

## 2011-03-15 DIAGNOSIS — I4891 Unspecified atrial fibrillation: Secondary | ICD-10-CM

## 2011-03-15 DIAGNOSIS — E7800 Pure hypercholesterolemia, unspecified: Secondary | ICD-10-CM

## 2011-03-15 DIAGNOSIS — I2510 Atherosclerotic heart disease of native coronary artery without angina pectoris: Secondary | ICD-10-CM

## 2011-03-15 DIAGNOSIS — I259 Chronic ischemic heart disease, unspecified: Secondary | ICD-10-CM

## 2011-03-15 NOTE — Progress Notes (Signed)
Karen Arroyo Date of Birth:  05-Apr-1934 [facility] [address]   [address] [place], [place]  [address] [phone]           Fax   [phone]  History of Present Illness: This pleasant 77 year old woman is seen for a scheduled followup office visit.  She has known ischemic heart disease.  She had coronary bypass graft surgery in 2000. She had recurrent angina and August 2012 and had cardiac catheterization which did not show any new occlusions or stenoses and continuing current medical therapy was recommended.  However since the catheter she's had less chest discomfort.  She takes only an occasional nitroglycerin.  Current Outpatient Prescriptions  Medication Sig Dispense Refill  . ACTOS 45 MG tablet TAKE 1/2 TABLET BY MOUTH EVERY DAY  30 tablet  6  . ALPRAZolam (XANAX) 0.25 MG tablet Take 0.25 mg by mouth as needed.        Marland Kitchen aspirin 81 MG chewable tablet Chew 81 mg by mouth daily.        Marland Kitchen atorvastatin (LIPITOR) 10 MG tablet Take 10 mg by mouth daily.        . benazepril (LOTENSIN) 20 MG tablet Take 20 mg by mouth 2 (two) times daily.        . Calcium-Vitamin D (CALTRATE 600 PLUS-VIT D PO) Take 1 tablet by mouth 2 (two) times daily.        . citalopram (CELEXA) 20 MG tablet Take 1 tablet by mouth Daily.      . fexofenadine (ALLEGRA) 60 MG tablet Take 60 mg by mouth as needed.        . furosemide (LASIX) 80 MG tablet Daily or as directed  60 tablet  11  . glimepiride (AMARYL) 2 MG tablet Take 1 mg by mouth daily before breakfast.        . metFORMIN (GLUCOPHAGE-XR) 500 MG 24 hr tablet Take 500 mg by mouth 2 (two) times daily.        . metoprolol tartrate (LOPRESSOR) 25 MG tablet TAKE 1/2 TABLET TWICE A DAY  30 tablet  11  . Multiple Vitamins-Minerals (MULTIVITAL PO) Take by mouth.        . nitroGLYCERIN (NITROSTAT) 0.4 MG SL tablet Place 1 tablet (0.4 mg total) under the tongue every 5 (five) minutes as needed.  25 tablet  6  . omeprazole (PRILOSEC) 20 MG  capsule Take 1 capsule (20 mg total) by mouth 2 (two) times daily.  60 capsule  11  . warfarin (COUMADIN) 5 MG tablet Take As Directed by [facility]  50 tablet  1  . zolpidem (AMBIEN) 5 MG tablet Take 1 tablet (5 mg total) by mouth at bedtime as needed for sleep.  30 tablet  5    Allergies  Allergen Reactions  . Ambien   . Aspirin   . Dimetapp Cold-Allergy   . Ilosone (Erythromycin Estolate)   . Morphine And Related   . Penicillins   . Restoril     Patient Active Problem List  Diagnoses  . Atrial fibrillation  . Coronary artery disease  . Encounter for long-term (current) use of anticoagulants  . Diabetes mellitus  . Dyslipidemia  . Ischemia of heart, chronic  . Diastolic dysfunction  . Pulmonary hypertension  . Chest pain  . Malaise and fatigue    History  Smoking status  . Never Smoker   Smokeless tobacco  . Not on file    History  Alcohol  Use No    Family History  Problem Relation Age of Onset  . Heart attack Mother   . Transient ischemic attack Father     Review of Systems: Constitutional: no fever chills diaphoresis or fatigue or change in weight.  Head and neck: no hearing loss, no epistaxis, no photophobia or visual disturbance. Respiratory: No cough, shortness of breath or wheezing. Cardiovascular: No chest pain peripheral edema, palpitations. Gastrointestinal: No abdominal distention, no abdominal pain, no change in bowel habits hematochezia or melena. Genitourinary: No dysuria, no frequency, no urgency, no nocturia. Musculoskeletal:No arthralgias, no back pain, no gait disturbance or myalgias. Neurological: No dizziness, no headaches, no numbness, no seizures, no syncope, no weakness, no tremors. Hematologic: No lymphadenopathy, no easy bruising. Psychiatric: No confusion, no hallucinations, no sleep disturbance.    Physical Exam: Filed Vitals:   03/15/11 1448  BP: 126/78  Pulse: 64   the general appearance reveals a well-developed  alert woman in no distress.  Her previous symptoms of depression are less evident today.Pupils equal and reactive.   Extraocular Movements are full.  There is no scleral icterus.  The mouth and pharynx are normal.  The neck is supple.  The carotids reveal no bruits.  The jugular venous pressure is normal.  The thyroid is not enlarged.  There is no lymphadenopathy.  The chest is clear to percussion and auscultation. There are no rales or rhonchi. Expansion of the chest is symmetrical.  The precordium is quiet.  The first heart sound is normal.  The second heart sound is physiologically split.  There is no murmur gallop rub or click.  There is no abnormal lift or heave.  The abdomen is soft and nontender. Bowel sounds are normal. The liver and spleen are not enlarged. There Are no abdominal masses. There are no bruits.  Extremities show trace edemaThe skin is warm and dry.  There is no rash. Strength is normal and symmetrical in all extremities.  There is no lateralizing weakness.  There are no sensory deficits.     Assessment / Plan: Continue present medication.  Recheck in 3 months for followup office visit and EKG.

## 2011-03-15 NOTE — Assessment & Plan Note (Signed)
The patient has a history of paroxysmal atrial fibrillation.  She is on long-term Coumadin.  Is not having any thromboembolic episodes

## 2011-03-15 NOTE — Assessment & Plan Note (Signed)
Her chest pain is very infrequent.  She rarely has to take any sublingual nitroglycerin.  If she does have brief atrial fibrillation it usually is relieved by resting for 10 minutes or less

## 2011-03-15 NOTE — Patient Instructions (Signed)
Your physician recommends that you continue on your current medications as directed. Please refer to the Current Medication list given to you today.  Your physician recommends that you schedule a follow-up appointment in: 3 months  

## 2011-03-15 NOTE — Assessment & Plan Note (Signed)
The patient is not having any hypoglycemic episode

## 2011-03-22 ENCOUNTER — Other Ambulatory Visit: Payer: Self-pay | Admitting: Cardiology

## 2011-04-07 ENCOUNTER — Other Ambulatory Visit: Payer: Self-pay | Admitting: Internal Medicine

## 2011-04-07 DIAGNOSIS — Z139 Encounter for screening, unspecified: Secondary | ICD-10-CM

## 2011-04-08 ENCOUNTER — Encounter: Payer: Self-pay | Admitting: Cardiology

## 2011-04-08 ENCOUNTER — Ambulatory Visit (INDEPENDENT_AMBULATORY_CARE_PROVIDER_SITE_OTHER): Payer: Medicare Other | Admitting: *Deleted

## 2011-04-08 DIAGNOSIS — I4891 Unspecified atrial fibrillation: Secondary | ICD-10-CM

## 2011-04-08 DIAGNOSIS — Z7901 Long term (current) use of anticoagulants: Secondary | ICD-10-CM

## 2011-04-13 ENCOUNTER — Ambulatory Visit
Admission: RE | Admit: 2011-04-13 | Discharge: 2011-04-13 | Disposition: A | Discharge status: Home / Self Care | Payer: Medicare Other | Source: Ambulatory Visit | Attending: Internal Medicine | Admitting: Internal Medicine

## 2011-04-13 DIAGNOSIS — Z139 Encounter for screening, unspecified: Secondary | ICD-10-CM

## 2011-04-14 ENCOUNTER — Ambulatory Visit (INDEPENDENT_AMBULATORY_CARE_PROVIDER_SITE_OTHER): Payer: Medicare Other | Admitting: Ophthalmology

## 2011-04-14 DIAGNOSIS — H43819 Vitreous degeneration, unspecified eye: Secondary | ICD-10-CM

## 2011-04-14 DIAGNOSIS — H3530 Unspecified macular degeneration: Secondary | ICD-10-CM

## 2011-04-14 DIAGNOSIS — E11319 Type 2 diabetes mellitus with unspecified diabetic retinopathy without macular edema: Secondary | ICD-10-CM

## 2011-04-14 DIAGNOSIS — E1139 Type 2 diabetes mellitus with other diabetic ophthalmic complication: Secondary | ICD-10-CM

## 2011-04-14 DIAGNOSIS — H26499 Other secondary cataract, unspecified eye: Secondary | ICD-10-CM

## 2011-04-20 ENCOUNTER — Other Ambulatory Visit: Payer: Self-pay | Admitting: *Deleted

## 2011-04-20 MED ORDER — ATORVASTATIN CALCIUM 10 MG PO TABS: 10.0000 mg | ORAL_TABLET | Freq: Every day | ORAL | Status: DC

## 2011-04-22 ENCOUNTER — Ambulatory Visit (INDEPENDENT_AMBULATORY_CARE_PROVIDER_SITE_OTHER): Payer: Medicare Other | Admitting: Ophthalmology

## 2011-04-22 DIAGNOSIS — H2700 Aphakia, unspecified eye: Secondary | ICD-10-CM

## 2011-04-23 ENCOUNTER — Ambulatory Visit (INDEPENDENT_AMBULATORY_CARE_PROVIDER_SITE_OTHER): Payer: Medicare Other | Admitting: Ophthalmology

## 2011-04-26 ENCOUNTER — Other Ambulatory Visit: Payer: Self-pay | Admitting: *Deleted

## 2011-04-26 DIAGNOSIS — F419 Anxiety disorder, unspecified: Secondary | ICD-10-CM

## 2011-04-26 MED ORDER — ALPRAZOLAM 0.25 MG PO TABS: 0.2500 mg | ORAL_TABLET | ORAL | Status: DC | PRN

## 2011-04-26 NOTE — Telephone Encounter (Signed)
Refill on alprazolam 

## 2011-05-20 ENCOUNTER — Encounter: Payer: Medicare Other | Admitting: *Deleted

## 2011-06-09 ENCOUNTER — Ambulatory Visit: Payer: Medicare Other | Admitting: Cardiology

## 2011-06-22 ENCOUNTER — Other Ambulatory Visit: Payer: Self-pay | Admitting: Cardiology

## 2011-06-24 ENCOUNTER — Ambulatory Visit (INDEPENDENT_AMBULATORY_CARE_PROVIDER_SITE_OTHER): Payer: Medicare Other | Admitting: *Deleted

## 2011-06-24 DIAGNOSIS — Z7901 Long term (current) use of anticoagulants: Secondary | ICD-10-CM

## 2011-06-24 DIAGNOSIS — I4891 Unspecified atrial fibrillation: Secondary | ICD-10-CM

## 2011-06-28 ENCOUNTER — Other Ambulatory Visit: Payer: Self-pay | Admitting: *Deleted

## 2011-06-28 MED ORDER — WARFARIN SODIUM 5 MG PO TABS: 5.0000 mg | ORAL_TABLET | ORAL | Status: DC

## 2011-07-05 ENCOUNTER — Ambulatory Visit (INDEPENDENT_AMBULATORY_CARE_PROVIDER_SITE_OTHER): Payer: Medicare Other | Admitting: Pharmacist

## 2011-07-05 ENCOUNTER — Encounter: Payer: Self-pay | Admitting: Cardiology

## 2011-07-05 ENCOUNTER — Ambulatory Visit (INDEPENDENT_AMBULATORY_CARE_PROVIDER_SITE_OTHER): Payer: Medicare Other | Admitting: Cardiology

## 2011-07-05 VITALS — BP 100/65 | HR 60 | Ht 64.0 in | Wt 138.8 lb

## 2011-07-05 DIAGNOSIS — I4891 Unspecified atrial fibrillation: Secondary | ICD-10-CM

## 2011-07-05 DIAGNOSIS — I5189 Other ill-defined heart diseases: Secondary | ICD-10-CM

## 2011-07-05 DIAGNOSIS — I519 Heart disease, unspecified: Secondary | ICD-10-CM

## 2011-07-05 DIAGNOSIS — Z7901 Long term (current) use of anticoagulants: Secondary | ICD-10-CM

## 2011-07-05 DIAGNOSIS — I2510 Atherosclerotic heart disease of native coronary artery without angina pectoris: Secondary | ICD-10-CM

## 2011-07-05 LAB — POCT INR: INR: 3.3

## 2011-07-05 NOTE — Progress Notes (Signed)
Karen Arroyo Date of Birth:  1933/10/21 [facility] [address] [place], [place]  [address] [phone]         Fax   [phone]  History of Present Illness: This pleasant 78 year old woman is seen for a three-month followup office visit.  She has a history of known ischemic heart disease.  She had coronary artery bypass graft surgery in 2000.  In August 2012 she had recurrent angina and underwent cardiac catheterization which did not show any new lesions and continued medical therapy was recommended.  Recently she has been having very little in the way of chest discomfort.  She has not had to take any recent nitroglycerin.  The patient has a history of diabetes and a history of hypercholesterolemia and a history of diastolic dysfunction and pulmonary hypertension.  She has a history of intermittent atrial fibrillation and is on long-term Coumadin.  Current Outpatient Prescriptions  Medication Sig Dispense Refill  . ACTOS 45 MG tablet TAKE 1/2 TABLET BY MOUTH EVERY DAY  30 tablet  6  . ALPRAZolam (XANAX) 0.25 MG tablet Take 1 tablet (0.25 mg total) by mouth as needed.  30 tablet  5  . aspirin 81 MG chewable tablet Chew 81 mg by mouth daily.        Marland Kitchen atorvastatin (LIPITOR) 10 MG tablet Take 1 tablet (10 mg total) by mouth daily.  30 tablet  8  . benazepril (LOTENSIN) 20 MG tablet TAKE 1 TABLET TWICE A DAY  60 tablet  9  . Calcium-Vitamin D (CALTRATE 600 PLUS-VIT D PO) Take 1 tablet by mouth 2 (two) times daily.        . citalopram (CELEXA) 20 MG tablet Take 1 tablet by mouth Daily.      . fexofenadine (ALLEGRA) 60 MG tablet Take 60 mg by mouth as needed.        . furosemide (LASIX) 80 MG tablet Daily or as directed  60 tablet  11  . glimepiride (AMARYL) 2 MG tablet Take 1 mg by mouth daily before breakfast.        . metFORMIN (GLUCOPHAGE-XR) 500 MG 24 hr tablet Take 500 mg by mouth 2 (two) times daily.        . metoprolol tartrate (LOPRESSOR) 25 MG tablet TAKE 1/2  TABLET TWICE A DAY  30 tablet  11  . Multiple Vitamins-Minerals (MULTIVITAL PO) Take by mouth.        . nitroGLYCERIN (NITROSTAT) 0.4 MG SL tablet Place 1 tablet (0.4 mg total) under the tongue every 5 (five) minutes as needed.  25 tablet  6  . omeprazole (PRILOSEC) 20 MG capsule Take 1 capsule (20 mg total) by mouth 2 (two) times daily.  60 capsule  11  . warfarin (COUMADIN) 5 MG tablet Take 1 tablet (5 mg total) by mouth as directed.  50 tablet  3  . zolpidem (AMBIEN) 5 MG tablet Take 1 tablet (5 mg total) by mouth at bedtime as needed for sleep.  30 tablet  5    Allergies  Allergen Reactions  . Ambien   . Aspirin   . Dimetapp Cold-Allergy   . Ilosone (Erythromycin Estolate)   . Morphine And Related   . Penicillins   . Restoril     Patient Active Problem List  Diagnoses  . Atrial fibrillation  . Coronary artery disease  . Encounter for long-term (current) use of anticoagulants  . Diabetes mellitus  . Dyslipidemia  . Ischemia of heart, chronic  .  Diastolic dysfunction  . Pulmonary hypertension  . Chest pain  . Malaise and fatigue    History  Smoking status  . Never Smoker   Smokeless tobacco  . Not on file    History  Alcohol Use No    Family History  Problem Relation Age of Onset  . Heart attack Mother   . Transient ischemic attack Father     Review of Systems: Constitutional: no fever chills diaphoresis or fatigue or change in weight.  Head and neck: no hearing loss, no epistaxis, no photophobia or visual disturbance. Respiratory: No cough, shortness of breath or wheezing. Cardiovascular: No chest pain peripheral edema, palpitations. Gastrointestinal: No abdominal distention, no abdominal pain, no change in bowel habits hematochezia or melena. Genitourinary: No dysuria, no frequency, no urgency, no nocturia. Musculoskeletal:No arthralgias, no back pain, no gait disturbance or myalgias. Neurological: No dizziness, no headaches, no numbness, no seizures, no  syncope, no weakness, no tremors. Hematologic: No lymphadenopathy, no easy bruising. Psychiatric: No confusion, no hallucinations, no sleep disturbance.    Physical Exam: Filed Vitals:   07/05/11 1516  BP: 100/65  Pulse: 60   the general appearance reveals a well-developed well-nourished woman in no distress.Pupils equal and reactive.   Extraocular Movements are full.  There is no scleral icterus.  The mouth and pharynx are normal.  The neck is supple.  The carotids reveal no bruits.  The jugular venous pressure is normal.  The thyroid is not enlarged.  There is no lymphadenopathy.  The chest is clear to percussion and auscultation. There are no rales or rhonchi. Expansion of the chest is symmetrical.  Heart reveals an irregular rhythm.  There is a soft apical systolic murmur.  There is no gallop or rub.The abdomen is soft and nontender. Bowel sounds are normal. The liver and spleen are not enlarged. There Are no abdominal masses. There are no bruits.  The pedal pulses are good.  There is no phlebitis or edema.  There is no cyanosis or clubbing.  She has a lot of spider veins on her ankles and she was reassured about the benign nature of these.  No evidence of deep vein thrombosis. Strength is normal and symmetrical in all extremities.  There is no lateralizing weakness.  There are no sensory deficits.  The skin is warm and dry.  There is no rash.  EKG today shows normal sinus rhythm with premature atrial complexes and nonspecific ST-T wave changes.   Assessment / Plan:  The patient will continue same medication.  Recheck in 3 months for followup office visit.

## 2011-07-05 NOTE — Assessment & Plan Note (Signed)
No recent symptoms to suggest worsening of diastolic dysfunction.  She has not been having any paroxysmal nocturnal dyspnea or peripheral edema

## 2011-07-05 NOTE — Assessment & Plan Note (Signed)
The patient has a history of diabetes mellitus.  She has occasional hypoglycemic episodes if she misses a meal or delays a meal.

## 2011-07-05 NOTE — Patient Instructions (Signed)
Your physician recommends that you schedule a follow-up appointment in: 3 months with Dr. Patty Sermons.The office will mail you a reminder letter 1 to 2 months prior appointment date. Your physician recommends that you continue on your current medications as directed. Please refer to the Current Medication list given to you today.

## 2011-07-05 NOTE — Assessment & Plan Note (Signed)
Patient has not been expressing any heavy pressure in her chest.  She has not taken any recent nitroglycerin.  She will sometimes note some exertional fatigue and she sits down and rests with good relief.

## 2011-07-05 NOTE — Assessment & Plan Note (Signed)
EKG today shows that she is in normal sinus rhythm with premature atrial beats.  She has been more aware of palpitations recently which may be a premature atrial beats that she is feeling.

## 2011-07-21 ENCOUNTER — Ambulatory Visit (INDEPENDENT_AMBULATORY_CARE_PROVIDER_SITE_OTHER): Payer: Medicare Other | Admitting: *Deleted

## 2011-07-21 DIAGNOSIS — Z7901 Long term (current) use of anticoagulants: Secondary | ICD-10-CM

## 2011-07-21 DIAGNOSIS — I4891 Unspecified atrial fibrillation: Secondary | ICD-10-CM

## 2011-07-21 LAB — POCT INR: INR: 3.2

## 2011-08-10 ENCOUNTER — Ambulatory Visit (INDEPENDENT_AMBULATORY_CARE_PROVIDER_SITE_OTHER): Payer: Medicare Other | Admitting: Pharmacist

## 2011-08-10 DIAGNOSIS — I4891 Unspecified atrial fibrillation: Secondary | ICD-10-CM

## 2011-08-10 DIAGNOSIS — Z7901 Long term (current) use of anticoagulants: Secondary | ICD-10-CM

## 2011-08-10 LAB — POCT INR: INR: 1.8

## 2011-08-26 ENCOUNTER — Other Ambulatory Visit: Payer: Self-pay | Admitting: Cardiology

## 2011-08-27 NOTE — Telephone Encounter (Signed)
..   Requested Prescriptions   Signed Prescriptions Disp Refills  . pioglitazone (ACTOS) 45 MG tablet 30 tablet 1    Sig: TAKE 1/2 TABLET BY MOUTH EVERY DAY    Authorizing Provider: Cassell Clement    Ordering User: Christella Hartigan, Cortavious Nix Judie Petit

## 2011-09-02 ENCOUNTER — Ambulatory Visit (INDEPENDENT_AMBULATORY_CARE_PROVIDER_SITE_OTHER): Payer: Medicare Other | Admitting: Pharmacist

## 2011-09-02 DIAGNOSIS — Z7901 Long term (current) use of anticoagulants: Secondary | ICD-10-CM

## 2011-09-02 DIAGNOSIS — I4891 Unspecified atrial fibrillation: Secondary | ICD-10-CM

## 2011-09-13 ENCOUNTER — Other Ambulatory Visit: Payer: Self-pay | Admitting: Cardiology

## 2011-09-21 ENCOUNTER — Ambulatory Visit (INDEPENDENT_AMBULATORY_CARE_PROVIDER_SITE_OTHER): Payer: Medicare Other

## 2011-09-21 DIAGNOSIS — I4891 Unspecified atrial fibrillation: Secondary | ICD-10-CM

## 2011-09-21 DIAGNOSIS — Z7901 Long term (current) use of anticoagulants: Secondary | ICD-10-CM

## 2011-09-21 LAB — POCT INR: INR: 2.1

## 2011-09-27 ENCOUNTER — Other Ambulatory Visit (HOSPITAL_COMMUNITY): Payer: Self-pay | Admitting: *Deleted

## 2011-09-27 DIAGNOSIS — F419 Anxiety disorder, unspecified: Secondary | ICD-10-CM

## 2011-09-27 MED ORDER — ALPRAZOLAM 0.25 MG PO TABS: 0.2500 mg | ORAL_TABLET | ORAL | Status: DC | PRN

## 2011-09-27 NOTE — Telephone Encounter (Signed)
Refill on alprazolam 

## 2011-10-19 ENCOUNTER — Ambulatory Visit (INDEPENDENT_AMBULATORY_CARE_PROVIDER_SITE_OTHER): Payer: Medicare Other | Admitting: *Deleted

## 2011-10-19 DIAGNOSIS — Z7901 Long term (current) use of anticoagulants: Secondary | ICD-10-CM

## 2011-10-19 DIAGNOSIS — I4891 Unspecified atrial fibrillation: Secondary | ICD-10-CM

## 2011-10-20 ENCOUNTER — Telehealth: Payer: Self-pay | Admitting: Cardiology

## 2011-10-20 NOTE — Telephone Encounter (Signed)
Advised to call Dr Vicente Males office since he is her PCP and  Dr. Patty Sermons out of the office this week

## 2011-10-20 NOTE — Telephone Encounter (Signed)
New msg Pt has sinus infection and wants to know if she could get med. Please call her back

## 2011-11-04 ENCOUNTER — Ambulatory Visit (INDEPENDENT_AMBULATORY_CARE_PROVIDER_SITE_OTHER): Payer: Medicare Other | Admitting: *Deleted

## 2011-11-04 DIAGNOSIS — I4891 Unspecified atrial fibrillation: Secondary | ICD-10-CM

## 2011-11-04 DIAGNOSIS — Z7901 Long term (current) use of anticoagulants: Secondary | ICD-10-CM

## 2011-11-11 ENCOUNTER — Ambulatory Visit (INDEPENDENT_AMBULATORY_CARE_PROVIDER_SITE_OTHER): Payer: Medicare Other | Admitting: Cardiology

## 2011-11-11 ENCOUNTER — Encounter: Payer: Self-pay | Admitting: Cardiology

## 2011-11-11 VITALS — BP 110/70 | HR 70 | Ht 64.0 in | Wt 138.0 lb

## 2011-11-11 DIAGNOSIS — E7800 Pure hypercholesterolemia, unspecified: Secondary | ICD-10-CM

## 2011-11-11 DIAGNOSIS — I2510 Atherosclerotic heart disease of native coronary artery without angina pectoris: Secondary | ICD-10-CM

## 2011-11-11 DIAGNOSIS — I4891 Unspecified atrial fibrillation: Secondary | ICD-10-CM

## 2011-11-11 NOTE — Assessment & Plan Note (Signed)
Patient has not been having any hypoglycemic episodes. 

## 2011-11-11 NOTE — Assessment & Plan Note (Signed)
Energy level has been stable since last visit.  Today she is getting ready to drive to the beach to be with her grandchildren

## 2011-11-11 NOTE — Patient Instructions (Addendum)
Your physician recommends that you continue on your current medications as directed. Please refer to the Current Medication list given to you today. Your physician recommends that you schedule a follow-up appointment in: 4 months with fasting labs (lp/bmet/hfp) and EKG  

## 2011-11-11 NOTE — Assessment & Plan Note (Signed)
She remains on long-term Coumadin.  She's had no TIA symptoms.  She has not been aware of any recent palpitations

## 2011-11-11 NOTE — Progress Notes (Signed)
Karen Arroyo Date of Birth:  22-Jul-1933 [facility] [address] [place], [place]  [address] [phone]         Fax   [phone]  History of Present Illness: This pleasant 78 year old woman is seen for a scheduled followup office visit.  She has a history of ischemic heart disease and had coronary artery bypass graft surgery in 2000.  In August 2012 she had recurrent angina pectoris and underwent cardiac catheterization which did not show any new lesions.  Since then she has had very little in the way of chest discomfort.  The patient also has a history of diastolic dysfunction, pulmonary hypertension, hypercholesterolemia, and diabetes.  She has had a history of intermittent atrial fib and is on long-term Coumadin.  Since last visit she's had no new cardiac symptoms.  Current Outpatient Prescriptions  Medication Sig Dispense Refill  . ALPRAZolam (XANAX) 0.25 MG tablet Take 1 tablet (0.25 mg total) by mouth as needed.  30 tablet  5  . ammonium lactate (AMLACTIN) 12 % cream as directed.      Marland Kitchen aspirin 81 MG chewable tablet Chew 81 mg by mouth daily.        Marland Kitchen atorvastatin (LIPITOR) 10 MG tablet Take 1 tablet (10 mg total) by mouth daily.  30 tablet  8  . benazepril (LOTENSIN) 20 MG tablet TAKE 1 TABLET TWICE A DAY  60 tablet  9  . Calcium-Vitamin D (CALTRATE 600 PLUS-VIT D PO) Take 1 tablet by mouth 2 (two) times daily.        . citalopram (CELEXA) 20 MG tablet Take 1 tablet by mouth Daily.      . fexofenadine (ALLEGRA) 60 MG tablet Take 60 mg by mouth as needed.        . furosemide (LASIX) 80 MG tablet Daily or as directed  60 tablet  11  . glimepiride (AMARYL) 2 MG tablet Take 1 mg by mouth daily before breakfast.        . metFORMIN (GLUCOPHAGE-XR) 500 MG 24 hr tablet Take 500 mg by mouth 2 (two) times daily.        . metoprolol tartrate (LOPRESSOR) 25 MG tablet TAKE 1/2 TABLET TWICE A DAY  30 tablet  3  . Multiple Vitamins-Minerals (MULTIVITAL PO) Take by mouth.         . nitroGLYCERIN (NITROSTAT) 0.4 MG SL tablet Place 1 tablet (0.4 mg total) under the tongue every 5 (five) minutes as needed.  25 tablet  6  . omeprazole (PRILOSEC) 20 MG capsule Take 1 capsule (20 mg total) by mouth 2 (two) times daily.  60 capsule  11  . pioglitazone (ACTOS) 45 MG tablet TAKE 1/2 TABLET BY MOUTH EVERY DAY  30 tablet  1  . warfarin (COUMADIN) 5 MG tablet Take 1 tablet (5 mg total) by mouth as directed.  50 tablet  3  . zolpidem (AMBIEN) 5 MG tablet Take 1 tablet (5 mg total) by mouth at bedtime as needed for sleep.  30 tablet  5    Allergies  Allergen Reactions  . Aspirin   . Dimetapp Cold-Allergy   . Ilosone (Erythromycin Estolate)   . Morphine And Related   . Penicillins   . Restoril   . Zolpidem Tartrate     Patient Active Problem List  Diagnosis  . Atrial fibrillation  . Coronary artery disease  . Encounter for long-term (current) use of anticoagulants  . Diabetes mellitus  . Dyslipidemia  . Ischemia of  heart, chronic  . Diastolic dysfunction  . Pulmonary hypertension  . Chest pain  . Malaise and fatigue    History  Smoking status  . Never Smoker   Smokeless tobacco  . Not on file    History  Alcohol Use No    Family History  Problem Relation Age of Onset  . Heart attack Mother   . Transient ischemic attack Father     Review of Systems: Constitutional: no fever chills diaphoresis or fatigue or change in weight.  Head and neck: no hearing loss, no epistaxis, no photophobia or visual disturbance. Respiratory: No cough, shortness of breath or wheezing. Cardiovascular: No chest pain peripheral edema, palpitations. Gastrointestinal: No abdominal distention, no abdominal pain, no change in bowel habits hematochezia or melena. Genitourinary: No dysuria, no frequency, no urgency, no nocturia. Musculoskeletal:No arthralgias, no back pain, no gait disturbance or myalgias. Neurological: No dizziness, no headaches, no numbness, no seizures, no  syncope, no weakness, no tremors. Hematologic: No lymphadenopathy, no easy bruising. Psychiatric: No confusion, no hallucinations, no sleep disturbance.    Physical Exam: Filed Vitals:   11/11/11 1012  BP: 110/70  Pulse: 70   the general appearance reveals a well-developed elderly woman in no distress.The head and neck exam reveals pupils equal and reactive.  Extraocular movements are full.  There is no scleral icterus.  The mouth and pharynx are normal.  The neck is supple.  The carotids reveal no bruits.  The jugular venous pressure is normal.  The  thyroid is not enlarged.  There is no lymphadenopathy.  The chest is clear to percussion and auscultation.  There are no rales or rhonchi.  Expansion of the chest is symmetrical.  The precordium is quiet.  The first heart sound is normal.  The second heart sound is physiologically split.  There is no murmur gallop rub or click.  There is no abnormal lift or heave.  The abdomen is soft and nontender.  The bowel sounds are normal.  The liver and spleen are not enlarged.  There are no abdominal masses.  There are no abdominal bruits.  Extremities reveal good pedal pulses.  There is no phlebitis or edema.  There is no cyanosis or clubbing.  Strength is normal and symmetrical in all extremities.  There is no lateralizing weakness.  There are no sensory deficits.  The skin is warm and dry.  There is no rash.     Assessment / Plan: Continue same medication.  Recheck in 4 months for office visit EKG lipid panel hepatic function panel and basal metabolic panel

## 2011-11-11 NOTE — Assessment & Plan Note (Signed)
The patient has not been having any recurrent angina pectoris. 

## 2011-11-17 ENCOUNTER — Other Ambulatory Visit: Payer: Self-pay | Admitting: Cardiology

## 2011-11-25 ENCOUNTER — Ambulatory Visit (INDEPENDENT_AMBULATORY_CARE_PROVIDER_SITE_OTHER): Payer: Medicare Other | Admitting: Pharmacist

## 2011-11-25 DIAGNOSIS — Z7901 Long term (current) use of anticoagulants: Secondary | ICD-10-CM

## 2011-11-25 DIAGNOSIS — I4891 Unspecified atrial fibrillation: Secondary | ICD-10-CM

## 2011-12-01 ENCOUNTER — Other Ambulatory Visit: Payer: Self-pay | Admitting: Cardiology

## 2011-12-02 ENCOUNTER — Other Ambulatory Visit: Payer: Self-pay | Admitting: Cardiology

## 2011-12-02 NOTE — Telephone Encounter (Signed)
Refilled omeprazole.

## 2011-12-03 NOTE — Telephone Encounter (Signed)
Refilled furosemide

## 2011-12-08 ENCOUNTER — Ambulatory Visit (INDEPENDENT_AMBULATORY_CARE_PROVIDER_SITE_OTHER): Payer: Medicare Other | Admitting: *Deleted

## 2011-12-08 DIAGNOSIS — I4891 Unspecified atrial fibrillation: Secondary | ICD-10-CM

## 2011-12-08 DIAGNOSIS — Z7901 Long term (current) use of anticoagulants: Secondary | ICD-10-CM

## 2011-12-08 LAB — POCT INR: INR: 4.1

## 2011-12-22 ENCOUNTER — Ambulatory Visit (INDEPENDENT_AMBULATORY_CARE_PROVIDER_SITE_OTHER): Payer: Medicare Other | Admitting: *Deleted

## 2011-12-22 ENCOUNTER — Other Ambulatory Visit: Payer: Self-pay | Admitting: Cardiology

## 2011-12-22 DIAGNOSIS — Z7901 Long term (current) use of anticoagulants: Secondary | ICD-10-CM

## 2011-12-22 DIAGNOSIS — I4891 Unspecified atrial fibrillation: Secondary | ICD-10-CM

## 2011-12-22 LAB — POCT INR: INR: 1.8

## 2011-12-23 NOTE — Telephone Encounter (Signed)
Refilled actos.

## 2011-12-28 ENCOUNTER — Other Ambulatory Visit: Payer: Self-pay | Admitting: Cardiology

## 2012-01-11 ENCOUNTER — Ambulatory Visit (INDEPENDENT_AMBULATORY_CARE_PROVIDER_SITE_OTHER): Payer: Medicare Other | Admitting: Pharmacist

## 2012-01-11 DIAGNOSIS — I4891 Unspecified atrial fibrillation: Secondary | ICD-10-CM

## 2012-01-11 DIAGNOSIS — Z7901 Long term (current) use of anticoagulants: Secondary | ICD-10-CM

## 2012-02-25 ENCOUNTER — Ambulatory Visit (INDEPENDENT_AMBULATORY_CARE_PROVIDER_SITE_OTHER): Payer: Medicare Other | Admitting: *Deleted

## 2012-02-25 ENCOUNTER — Encounter: Payer: Self-pay | Admitting: Cardiology

## 2012-02-25 ENCOUNTER — Ambulatory Visit (INDEPENDENT_AMBULATORY_CARE_PROVIDER_SITE_OTHER): Payer: Medicare Other | Admitting: Cardiology

## 2012-02-25 VITALS — BP 140/70 | HR 50 | Resp 18 | Ht 67.0 in | Wt 147.8 lb

## 2012-02-25 DIAGNOSIS — E7800 Pure hypercholesterolemia, unspecified: Secondary | ICD-10-CM

## 2012-02-25 DIAGNOSIS — I4891 Unspecified atrial fibrillation: Secondary | ICD-10-CM

## 2012-02-25 DIAGNOSIS — Z7901 Long term (current) use of anticoagulants: Secondary | ICD-10-CM

## 2012-02-25 DIAGNOSIS — I480 Paroxysmal atrial fibrillation: Secondary | ICD-10-CM

## 2012-02-25 DIAGNOSIS — I519 Heart disease, unspecified: Secondary | ICD-10-CM

## 2012-02-25 DIAGNOSIS — I5189 Other ill-defined heart diseases: Secondary | ICD-10-CM

## 2012-02-25 LAB — POCT INR: INR: 3

## 2012-02-25 NOTE — Progress Notes (Signed)
Karen Arroyo Date of Birth:  01-16-1934 [facility] [address] [place], [place]  [address] [phone]         Fax   [phone]  History of Present Illness: This pleasant 78 year old woman is seen for a scheduled followup office visit. She has a history of ischemic heart disease and had coronary artery bypass graft surgery in 2000. In August 2012 she had recurrent angina pectoris and underwent cardiac catheterization which did not show any new lesions. Since then she has had very little in the way of chest discomfort. The patient also has a history of diastolic dysfunction, pulmonary hypertension, hypercholesterolemia, and diabetes. She has had a history of intermittent atrial fib and is on long-term Coumadin. Since last visit she's had no new cardiac symptoms.   Current Outpatient Prescriptions  Medication Sig Dispense Refill  . ALPRAZolam (XANAX) 0.25 MG tablet Take 1 tablet (0.25 mg total) by mouth as needed.  30 tablet  5  . ammonium lactate (AMLACTIN) 12 % cream as directed.      Marland Kitchen aspirin 81 MG chewable tablet Chew 81 mg by mouth daily.        Marland Kitchen atorvastatin (LIPITOR) 10 MG tablet Take 1 tablet (10 mg total) by mouth daily.  30 tablet  8  . benazepril (LOTENSIN) 20 MG tablet TAKE 1 TABLET TWICE A DAY  60 tablet  9  . Calcium-Vitamin D (CALTRATE 600 PLUS-VIT D PO) Take 1 tablet by mouth 2 (two) times daily.        . citalopram (CELEXA) 20 MG tablet Take 1 tablet by mouth Daily.      . fexofenadine (ALLEGRA) 60 MG tablet Take 60 mg by mouth as needed.        . furosemide (LASIX) 80 MG tablet TAKE 1 TABLET EVERY DAY OR AS DIRECTED  60 tablet  3  . glimepiride (AMARYL) 2 MG tablet Take 1 mg by mouth daily before breakfast.        . metFORMIN (GLUCOPHAGE-XR) 500 MG 24 hr tablet Take 500 mg by mouth 2 (two) times daily.        . metoprolol tartrate (LOPRESSOR) 25 MG tablet TAKE 1/2 TABLET TWICE A DAY  30 tablet  3  . Multiple Vitamins-Minerals (MULTIVITAL PO)  Take by mouth.        . nitroGLYCERIN (NITROSTAT) 0.4 MG SL tablet Place 1 tablet (0.4 mg total) under the tongue every 5 (five) minutes as needed.  25 tablet  6  . omeprazole (PRILOSEC) 20 MG capsule TAKE ONE CAPSULE BY MOUTH TWICE A DAY  60 capsule  10  . pioglitazone (ACTOS) 45 MG tablet TAKE 1/2 TABLET BY MOUTH EVERY DAY  30 tablet  3  . warfarin (COUMADIN) 5 MG tablet TAKE 1 TABLET (5 MG TOTAL) BY MOUTH AS DIRECTED.  50 tablet  3  . zolpidem (AMBIEN) 5 MG tablet Take 1 tablet (5 mg total) by mouth at bedtime as needed for sleep.  30 tablet  5    Allergies  Allergen Reactions  . Aspirin   . Dimetapp Cold-Allergy   . Ilosone (Erythromycin Estolate)   . Morphine And Related   . Penicillins   . Restoril   . Zolpidem Tartrate     Patient Active Problem List  Diagnosis  . Atrial fibrillation  . Coronary artery disease  . Encounter for long-term (current) use of anticoagulants  . Diabetes mellitus  . Dyslipidemia  . Ischemia of heart, chronic  . Diastolic  dysfunction  . Pulmonary hypertension  . Chest pain  . Malaise and fatigue    History  Smoking status  . Never Smoker   Smokeless tobacco  . Not on file    History  Alcohol Use No    Family History  Problem Relation Age of Onset  . Heart attack Mother   . Transient ischemic attack Father     Review of Systems: Constitutional: no fever chills diaphoresis or fatigue or change in weight.  Head and neck: no hearing loss, no epistaxis, no photophobia or visual disturbance. Respiratory: No cough, shortness of breath or wheezing. Cardiovascular: No chest pain peripheral edema, palpitations. Gastrointestinal: No abdominal distention, no abdominal pain, no change in bowel habits hematochezia or melena. Genitourinary: No dysuria, no frequency, no urgency, no nocturia. Musculoskeletal:No arthralgias, no back pain, no gait disturbance or myalgias. Neurological: No dizziness, no headaches, no numbness, no seizures, no  syncope, no weakness, no tremors. Hematologic: No lymphadenopathy, no easy bruising. Psychiatric: No confusion, no hallucinations, no sleep disturbance.    Physical Exam: Filed Vitals:   02/25/12 1624  BP: 140/70  Pulse: 50  Resp: 18   the general appearance reveals a well-developed well-nourished woman in no distress.The head and neck exam reveals pupils equal and reactive.  Extraocular movements are full.  There is no scleral icterus.  The mouth and pharynx are normal.  The neck is supple.  The carotids reveal no bruits.  The jugular venous pressure is normal.  The  thyroid is not enlarged.  There is no lymphadenopathy.  The chest is clear to percussion and auscultation.  There are no rales or rhonchi.  Expansion of the chest is symmetrical.  The precordium is quiet.  The first heart sound is normal.  The second heart sound is physiologically split.  There is a grade 2/6 systolic ejection murmur at the left sternal edge and a soft diastolic murmur at the left sternal edge may be pulmonic insufficiency.  There is no abnormal lift or heave.  The abdomen is soft and nontender.  The bowel sounds are normal.  The liver and spleen are not enlarged.  There are no abdominal masses.  There are no abdominal bruits.  Extremities reveal good pedal pulses.  There is no phlebitis or edema.  There is no cyanosis or clubbing.  Strength is normal and symmetrical in all extremities.  There is no lateralizing weakness.  There are no sensory deficits.  The skin is warm and dry.  There is no rash.  EKG today shows sinus bradycardia and incomplete right bundle branch block and widespread ST-T wave abnormalities unchanged since 07/05/11  Assessment / Plan: Continue on same medication.  Continue long-term Coumadin.  Recheck in 4 months for followup office visit lipid panel hepatic function panel and basal metabolic panel

## 2012-02-25 NOTE — Patient Instructions (Addendum)
Your physician recommends that you continue on your current medications as directed. Please refer to the Current Medication list given to you today.  Your physician recommends that you schedule a follow-up appointment in: 4 months with fasting labs (lp/bmet/hfp)  

## 2012-02-25 NOTE — Assessment & Plan Note (Signed)
The patient has not had any worsening of her diastolic dysfunction clinically.  She's not having symptoms of overt CHF.

## 2012-02-25 NOTE — Assessment & Plan Note (Signed)
The patient rarely has any chest discomfort.  She has not had to take any recent sublingual nitroglycerin.  She gets exercise by walking her dog 2 or 3 miles a day

## 2012-02-25 NOTE — Assessment & Plan Note (Signed)
Patient has had no TIA symptoms.  She feels occasional fluttering of her heart.

## 2012-03-24 ENCOUNTER — Ambulatory Visit (INDEPENDENT_AMBULATORY_CARE_PROVIDER_SITE_OTHER): Payer: Medicare Other | Admitting: Pharmacist

## 2012-03-24 DIAGNOSIS — I4891 Unspecified atrial fibrillation: Secondary | ICD-10-CM

## 2012-03-24 DIAGNOSIS — Z7901 Long term (current) use of anticoagulants: Secondary | ICD-10-CM

## 2012-03-24 LAB — POCT INR: INR: 3.8

## 2012-04-06 ENCOUNTER — Other Ambulatory Visit: Payer: Self-pay | Admitting: *Deleted

## 2012-04-06 MED ORDER — WARFARIN SODIUM 5 MG PO TABS: 5.0000 mg | ORAL_TABLET | ORAL | Status: DC

## 2012-04-14 ENCOUNTER — Ambulatory Visit (INDEPENDENT_AMBULATORY_CARE_PROVIDER_SITE_OTHER): Payer: Medicare Other | Admitting: *Deleted

## 2012-04-14 DIAGNOSIS — I4891 Unspecified atrial fibrillation: Secondary | ICD-10-CM

## 2012-04-14 DIAGNOSIS — Z7901 Long term (current) use of anticoagulants: Secondary | ICD-10-CM

## 2012-04-14 LAB — POCT INR: INR: 2.4

## 2012-04-24 ENCOUNTER — Ambulatory Visit (INDEPENDENT_AMBULATORY_CARE_PROVIDER_SITE_OTHER): Payer: Medicare Other | Admitting: Ophthalmology

## 2012-05-01 ENCOUNTER — Ambulatory Visit (INDEPENDENT_AMBULATORY_CARE_PROVIDER_SITE_OTHER): Payer: Medicare Other | Admitting: Ophthalmology

## 2012-05-01 DIAGNOSIS — H43819 Vitreous degeneration, unspecified eye: Secondary | ICD-10-CM

## 2012-05-01 DIAGNOSIS — H3530 Unspecified macular degeneration: Secondary | ICD-10-CM

## 2012-05-01 DIAGNOSIS — E1165 Type 2 diabetes mellitus with hyperglycemia: Secondary | ICD-10-CM

## 2012-05-01 DIAGNOSIS — E1139 Type 2 diabetes mellitus with other diabetic ophthalmic complication: Secondary | ICD-10-CM

## 2012-05-01 DIAGNOSIS — E11319 Type 2 diabetes mellitus with unspecified diabetic retinopathy without macular edema: Secondary | ICD-10-CM

## 2012-05-01 DIAGNOSIS — H35039 Hypertensive retinopathy, unspecified eye: Secondary | ICD-10-CM

## 2012-05-01 DIAGNOSIS — I10 Essential (primary) hypertension: Secondary | ICD-10-CM

## 2012-05-08 ENCOUNTER — Other Ambulatory Visit: Payer: Self-pay

## 2012-05-08 MED ORDER — BENAZEPRIL HCL 20 MG PO TABS: 20.0000 mg | ORAL_TABLET | Freq: Every day | ORAL | Status: DC

## 2012-05-12 ENCOUNTER — Ambulatory Visit (INDEPENDENT_AMBULATORY_CARE_PROVIDER_SITE_OTHER): Payer: Medicare Other

## 2012-05-12 DIAGNOSIS — I4891 Unspecified atrial fibrillation: Secondary | ICD-10-CM

## 2012-05-12 DIAGNOSIS — Z7901 Long term (current) use of anticoagulants: Secondary | ICD-10-CM

## 2012-05-17 ENCOUNTER — Other Ambulatory Visit: Payer: Self-pay | Admitting: *Deleted

## 2012-05-17 MED ORDER — BENAZEPRIL HCL 20 MG PO TABS: 20.0000 mg | ORAL_TABLET | Freq: Every day | ORAL | Status: DC

## 2012-06-02 ENCOUNTER — Ambulatory Visit (INDEPENDENT_AMBULATORY_CARE_PROVIDER_SITE_OTHER): Payer: Medicare Other | Admitting: *Deleted

## 2012-06-02 DIAGNOSIS — I4891 Unspecified atrial fibrillation: Secondary | ICD-10-CM

## 2012-06-02 DIAGNOSIS — Z7901 Long term (current) use of anticoagulants: Secondary | ICD-10-CM

## 2012-06-02 LAB — POCT INR: INR: 2.6

## 2012-06-08 ENCOUNTER — Encounter: Payer: Self-pay | Admitting: Cardiology

## 2012-06-20 ENCOUNTER — Encounter: Payer: Self-pay | Admitting: Cardiology

## 2012-06-30 ENCOUNTER — Ambulatory Visit (INDEPENDENT_AMBULATORY_CARE_PROVIDER_SITE_OTHER): Payer: Medicare Other | Admitting: Cardiology

## 2012-06-30 ENCOUNTER — Encounter: Payer: Self-pay | Admitting: Cardiology

## 2012-06-30 ENCOUNTER — Ambulatory Visit (INDEPENDENT_AMBULATORY_CARE_PROVIDER_SITE_OTHER): Payer: Medicare Other

## 2012-06-30 ENCOUNTER — Other Ambulatory Visit (INDEPENDENT_AMBULATORY_CARE_PROVIDER_SITE_OTHER): Payer: Medicare Other

## 2012-06-30 VITALS — BP 142/76 | HR 46 | Ht 64.0 in | Wt 153.6 lb

## 2012-06-30 DIAGNOSIS — E7800 Pure hypercholesterolemia, unspecified: Secondary | ICD-10-CM

## 2012-06-30 DIAGNOSIS — I259 Chronic ischemic heart disease, unspecified: Secondary | ICD-10-CM

## 2012-06-30 DIAGNOSIS — I4891 Unspecified atrial fibrillation: Secondary | ICD-10-CM

## 2012-06-30 DIAGNOSIS — R0989 Other specified symptoms and signs involving the circulatory and respiratory systems: Secondary | ICD-10-CM

## 2012-06-30 DIAGNOSIS — Z7901 Long term (current) use of anticoagulants: Secondary | ICD-10-CM

## 2012-06-30 DIAGNOSIS — I519 Heart disease, unspecified: Secondary | ICD-10-CM

## 2012-06-30 DIAGNOSIS — I5189 Other ill-defined heart diseases: Secondary | ICD-10-CM

## 2012-06-30 DIAGNOSIS — I2510 Atherosclerotic heart disease of native coronary artery without angina pectoris: Secondary | ICD-10-CM

## 2012-06-30 DIAGNOSIS — I480 Paroxysmal atrial fibrillation: Secondary | ICD-10-CM

## 2012-06-30 LAB — POCT INR: INR: 3.4

## 2012-06-30 MED ORDER — METOPROLOL TARTRATE 25 MG PO TABS: ORAL_TABLET | ORAL | Status: DC

## 2012-06-30 MED ORDER — OMEPRAZOLE 20 MG PO CPDR: 20.0000 mg | DELAYED_RELEASE_CAPSULE | Freq: Two times a day (BID) | ORAL | Status: DC

## 2012-06-30 NOTE — Assessment & Plan Note (Signed)
The patient has not been experiencing any severe chest pain.

## 2012-06-30 NOTE — Patient Instructions (Addendum)

## 2012-06-30 NOTE — Progress Notes (Signed)
[name] Date of Birth:  1933/05/05 [facility] [address] [place], [place]  [address] [phone]         Fax   [phone]  History of Present Illness: This pleasant 79 year old woman is seen for a scheduled followup office visit. She has a history of ischemic heart disease and had coronary artery bypass graft surgery in 2000. In August 2012 she had recurrent angina pectoris and underwent cardiac catheterization which did not show any new lesions. Since then she has had very little in the way of chest discomfort. The patient also has a history of diastolic dysfunction, pulmonary hypertension, hypercholesterolemia, and diabetes. She has had a history of intermittent atrial fib and is on long-term Coumadin. Since last visit she's had no new cardiac symptoms.   Current Outpatient Prescriptions  Medication Sig Dispense Refill  . ALPRAZolam (XANAX) 0.25 MG tablet Take 1 tablet (0.25 mg total) by mouth as needed.  30 tablet  5  . ammonium lactate (AMLACTIN) 12 % cream as directed.      Marland Kitchen aspirin 81 MG chewable tablet Chew 81 mg by mouth daily.        Marland Kitchen atorvastatin (LIPITOR) 10 MG tablet Take 1 tablet (10 mg total) by mouth daily.  30 tablet  8  . benazepril (LOTENSIN) 20 MG tablet Take 1 tablet (20 mg total) by mouth daily.  60 tablet  6  . Calcium-Vitamin D (CALTRATE 600 PLUS-VIT D PO) Take 1 tablet by mouth 2 (two) times daily.        . citalopram (CELEXA) 20 MG tablet Take 1 tablet by mouth Daily.      . fexofenadine (ALLEGRA) 60 MG tablet Take 60 mg by mouth as needed.        . furosemide (LASIX) 80 MG tablet TAKE 1 TABLET EVERY DAY OR AS DIRECTED  60 tablet  3  . glimepiride (AMARYL) 2 MG tablet Take 1 mg by mouth daily before breakfast.        . metFORMIN (GLUCOPHAGE-XR) 500 MG 24 hr tablet Take 500 mg by mouth 2 (two) times daily.        . metoprolol tartrate (LOPRESSOR) 25 MG tablet 1/2 tablet twice a day  30 tablet  11  . Multiple Vitamins-Minerals  (MULTIVITAL PO) Take by mouth.        . nitroGLYCERIN (NITROSTAT) 0.4 MG SL tablet Place 1 tablet (0.4 mg total) under the tongue every 5 (five) minutes as needed.  25 tablet  6  . omeprazole (PRILOSEC) 20 MG capsule Take 1 capsule (20 mg total) by mouth 2 (two) times daily.  60 capsule  11  . pioglitazone (ACTOS) 45 MG tablet TAKE 1/2 TABLET BY MOUTH EVERY DAY  30 tablet  3  . traZODone (DESYREL) 50 MG tablet Take 50 mg by mouth at bedtime as needed for sleep.      Marland Kitchen warfarin (COUMADIN) 5 MG tablet Take 1 tablet (5 mg total) by mouth as directed.  50 tablet  3   No current facility-administered medications for this visit.    Allergies  Allergen Reactions  . Aspirin   . Dimetapp Cold-Allergy   . Ilosone (Erythromycin Estolate)   . Morphine And Related   . Penicillins   . Restoril   . Zolpidem Tartrate     Patient Active Problem List  Diagnosis  . Atrial fibrillation  . Coronary artery disease  . Encounter for long-term (current) use of anticoagulants  . Diabetes mellitus  .  Dyslipidemia  . Ischemia of heart, chronic  . Diastolic dysfunction  . Pulmonary hypertension  . Chest pain  . Malaise and fatigue    History  Smoking status  . Never Smoker   Smokeless tobacco  . Not on file    History  Alcohol Use No    Family History  Problem Relation Age of Onset  . Heart attack Mother   . Transient ischemic attack Father     Review of Systems: Constitutional: no fever chills diaphoresis or fatigue or change in weight.  Head and neck: no hearing loss, no epistaxis, no photophobia or visual disturbance. Respiratory: No cough, shortness of breath or wheezing. Cardiovascular: No chest pain peripheral edema, palpitations. Gastrointestinal: No abdominal distention, no abdominal pain, no change in bowel habits hematochezia or melena. Genitourinary: No dysuria, no frequency, no urgency, no nocturia. Musculoskeletal:No arthralgias, no back pain, no gait disturbance or  myalgias. Neurological: No dizziness, no headaches, no numbness, no seizures, no syncope, no weakness, no tremors. Hematologic: No lymphadenopathy, no easy bruising. Psychiatric: No confusion, no hallucinations, no sleep disturbance.    Physical Exam: Filed Vitals:   06/30/12 0942  BP: 142/76  Pulse: 46   general appearance reveals a well-developed well-nourished woman in no distress.The head and neck exam reveals pupils equal and reactive.  Extraocular movements are full.  There is no scleral icterus.  The mouth and pharynx are normal.  The neck is supple.  The carotids reveal no bruits.  The jugular venous pressure is normal.  The  thyroid is not enlarged.  There is no lymphadenopathy.  The chest is clear to percussion and auscultation.  There are no rales or rhonchi.  Expansion of the chest is symmetrical.  The precordium is quiet.  The first heart sound is normal.  The second heart sound is physiologically split.  There is no murmur gallop rub or click.  There is no abnormal lift or heave.  The abdomen is soft and nontender.  The bowel sounds are normal.  The liver and spleen are not enlarged.  There are no abdominal masses.  There are no abdominal bruits.  Extremities reveal good pedal pulses.  There is no phlebitis or edema.  There is no cyanosis or clubbing.  Strength is normal and symmetrical in all extremities.  There is no lateralizing weakness.  There are no sensory deficits.  The skin is warm and dry.  There is no rash.     Assessment / Plan: The patient is to continue same medication.  We are checking lab work today.  We refilled some of her cardiac meds today.  Return in 4 months for followup office visit lipid panel hepatic function panel and basal metabolic panel.  She is looking forward to a birth of twins this summer to her family in [place].

## 2012-06-30 NOTE — Assessment & Plan Note (Signed)
The patient has had a 6 pound weight gain since last visit.  She has had some peripheral edema which is chronic.  She has not been having any paroxysmal nocturnal dyspnea.  She is trying to limit her dietary salt.

## 2012-06-30 NOTE — Assessment & Plan Note (Signed)
The patient has not been experiencing any sustained periods of atrial fibrillation.  She is on long-term Coumadin.  She has not had any TIA symptoms.

## 2012-07-27 ENCOUNTER — Ambulatory Visit (INDEPENDENT_AMBULATORY_CARE_PROVIDER_SITE_OTHER): Payer: Medicare Other | Admitting: *Deleted

## 2012-07-27 DIAGNOSIS — Z7901 Long term (current) use of anticoagulants: Secondary | ICD-10-CM

## 2012-07-27 DIAGNOSIS — I4891 Unspecified atrial fibrillation: Secondary | ICD-10-CM

## 2012-07-28 ENCOUNTER — Other Ambulatory Visit: Payer: Self-pay

## 2012-07-28 MED ORDER — FUROSEMIDE 80 MG PO TABS: ORAL_TABLET | ORAL | Status: DC

## 2012-07-28 NOTE — Telephone Encounter (Signed)
..   Requested Prescriptions   Signed Prescriptions Disp Refills  . furosemide (LASIX) 80 MG tablet 60 tablet 3    Sig: TAKE 1 TABLET EVERY DAY OR AS DIRECTED    Authorizing Provider: Cassell Clement    Ordering User: Christella Hartigan, Madelaine Whipple Judie Petit

## 2012-08-11 ENCOUNTER — Other Ambulatory Visit: Payer: Self-pay | Admitting: *Deleted

## 2012-08-11 MED ORDER — FUROSEMIDE 80 MG PO TABS: ORAL_TABLET | ORAL | Status: DC

## 2012-08-17 ENCOUNTER — Ambulatory Visit (INDEPENDENT_AMBULATORY_CARE_PROVIDER_SITE_OTHER): Payer: Medicare Other | Admitting: *Deleted

## 2012-08-17 DIAGNOSIS — I4891 Unspecified atrial fibrillation: Secondary | ICD-10-CM

## 2012-08-17 DIAGNOSIS — Z7901 Long term (current) use of anticoagulants: Secondary | ICD-10-CM

## 2012-08-18 ENCOUNTER — Other Ambulatory Visit: Payer: Self-pay | Admitting: *Deleted

## 2012-08-18 MED ORDER — WARFARIN SODIUM 5 MG PO TABS: 5.0000 mg | ORAL_TABLET | ORAL | Status: DC

## 2012-09-14 ENCOUNTER — Ambulatory Visit (INDEPENDENT_AMBULATORY_CARE_PROVIDER_SITE_OTHER): Payer: Medicare Other

## 2012-09-14 DIAGNOSIS — Z7901 Long term (current) use of anticoagulants: Secondary | ICD-10-CM

## 2012-09-14 DIAGNOSIS — I4891 Unspecified atrial fibrillation: Secondary | ICD-10-CM

## 2012-10-03 ENCOUNTER — Other Ambulatory Visit (INDEPENDENT_AMBULATORY_CARE_PROVIDER_SITE_OTHER): Payer: Medicare Other

## 2012-10-03 DIAGNOSIS — E7800 Pure hypercholesterolemia, unspecified: Secondary | ICD-10-CM

## 2012-10-03 LAB — HEPATIC FUNCTION PANEL
Albumin: 3.7 g/dL (ref 3.5–5.2)
Alkaline Phosphatase: 64 U/L (ref 39–117)
Bilirubin, Direct: 0 mg/dL (ref 0.0–0.3)

## 2012-10-03 LAB — BASIC METABOLIC PANEL
BUN: 22 mg/dL (ref 6–23)
CO2: 29 mEq/L (ref 19–32)
Chloride: 102 mEq/L (ref 96–112)
Creatinine, Ser: 0.9 mg/dL (ref 0.4–1.2)
Potassium: 3.7 mEq/L (ref 3.5–5.1)

## 2012-10-03 LAB — LIPID PANEL
HDL: 67 mg/dL (ref >39.00)
Total CHOL/HDL Ratio: 3
VLDL: 16.6 mg/dL (ref 0.0–40.0)

## 2012-10-03 LAB — LDL CHOLESTEROL, DIRECT: Direct LDL: 124.9 mg/dL

## 2012-10-03 NOTE — Progress Notes (Signed)
Quick Note:  Please make copy of labs for patient visit. ______ 

## 2012-10-15 ENCOUNTER — Other Ambulatory Visit: Payer: Self-pay | Admitting: Cardiology

## 2012-10-15 DIAGNOSIS — F419 Anxiety disorder, unspecified: Secondary | ICD-10-CM

## 2012-10-24 ENCOUNTER — Encounter: Payer: Self-pay | Admitting: Cardiology

## 2012-10-24 ENCOUNTER — Ambulatory Visit (INDEPENDENT_AMBULATORY_CARE_PROVIDER_SITE_OTHER): Payer: Medicare Other | Admitting: Pharmacist

## 2012-10-24 ENCOUNTER — Ambulatory Visit (INDEPENDENT_AMBULATORY_CARE_PROVIDER_SITE_OTHER): Payer: Medicare Other | Admitting: Cardiology

## 2012-10-24 VITALS — BP 112/66 | HR 60 | Ht 64.0 in | Wt 149.8 lb

## 2012-10-24 DIAGNOSIS — I480 Paroxysmal atrial fibrillation: Secondary | ICD-10-CM

## 2012-10-24 DIAGNOSIS — I4891 Unspecified atrial fibrillation: Secondary | ICD-10-CM

## 2012-10-24 DIAGNOSIS — I519 Heart disease, unspecified: Secondary | ICD-10-CM

## 2012-10-24 DIAGNOSIS — I259 Chronic ischemic heart disease, unspecified: Secondary | ICD-10-CM

## 2012-10-24 DIAGNOSIS — I5189 Other ill-defined heart diseases: Secondary | ICD-10-CM

## 2012-10-24 DIAGNOSIS — Z7901 Long term (current) use of anticoagulants: Secondary | ICD-10-CM

## 2012-10-24 DIAGNOSIS — IMO0001 Reserved for inherently not codable concepts without codable children: Secondary | ICD-10-CM

## 2012-10-24 LAB — POCT INR: INR: 2.7

## 2012-10-24 NOTE — Patient Instructions (Addendum)
Your physician recommends that you continue on your current medications as directed. Please refer to the Current Medication list given to you today.  Your physician wants you to follow-up in: 4 months with fasting labs (lp/bmet/hfp) You will receive a reminder letter in the mail two months in advance. If you don't receive a letter, please call our office to schedule the follow-up appointment.  

## 2012-10-24 NOTE — Progress Notes (Signed)
Karen Arroyo Date of Birth:  12-Jul-1933 [facility] [address] [name], [place]  [address] [phone]         Fax   [phone]  History of Present Illness: This pleasant 80 year old woman is seen for a scheduled followup office visit. She has a history of ischemic heart disease and had coronary artery bypass graft surgery in 2000. In August 2012 she had recurrent angina pectoris and underwent cardiac catheterization which did not show any new lesions. Since then she has had very little in the way of chest discomfort. The patient also has a history of diastolic dysfunction, pulmonary hypertension, hypercholesterolemia, and diabetes. She has had a history of intermittent atrial fib and is on long-term Coumadin. Since last visit she's had no new cardiac symptoms.  She was having more symptoms of arthritis with painful right knee with motion but does not intend to have any evaluation or surgery at this time.   Current Outpatient Prescriptions  Medication Sig Dispense Refill  . ALPRAZolam (XANAX) 0.25 MG tablet TAKE 1 TABLET EVERY 6 HOURS AS NEEDED FOR NERVES/STRESS  30 tablet  5  . ammonium lactate (AMLACTIN) 12 % cream as directed.      Marland Kitchen aspirin 81 MG chewable tablet Chew 81 mg by mouth daily.        Marland Kitchen atorvastatin (LIPITOR) 10 MG tablet Take 1 tablet (10 mg total) by mouth daily.  30 tablet  8  . benazepril (LOTENSIN) 20 MG tablet Take 1 tablet (20 mg total) by mouth daily.  60 tablet  6  . Calcium-Vitamin D (CALTRATE 600 PLUS-VIT D PO) Take 1 tablet by mouth 2 (two) times daily.        . citalopram (CELEXA) 20 MG tablet Take 1 tablet by mouth Daily.      . fexofenadine (ALLEGRA) 60 MG tablet Take 60 mg by mouth as needed.        . furosemide (LASIX) 80 MG tablet TAKE 1 TABLET EVERY DAY OR AS DIRECTED  60 tablet  3  . glimepiride (AMARYL) 2 MG tablet Take 1 mg by mouth daily before breakfast.        . metFORMIN (GLUCOPHAGE-XR) 500 MG 24 hr tablet Take 500 mg by  mouth 2 (two) times daily.        . metoprolol tartrate (LOPRESSOR) 25 MG tablet 1/2 tablet twice a day  30 tablet  11  . Multiple Vitamins-Minerals (MULTIVITAL PO) Take by mouth.        . nitroGLYCERIN (NITROSTAT) 0.4 MG SL tablet Place 1 tablet (0.4 mg total) under the tongue every 5 (five) minutes as needed.  25 tablet  6  . omeprazole (PRILOSEC) 20 MG capsule Take 1 capsule (20 mg total) by mouth 2 (two) times daily.  60 capsule  11  . pioglitazone (ACTOS) 45 MG tablet TAKE 1/2 TABLET BY MOUTH EVERY DAY  30 tablet  3  . traZODone (DESYREL) 50 MG tablet Take 50 mg by mouth at bedtime as needed for sleep.      Marland Kitchen warfarin (COUMADIN) 5 MG tablet Take 1 tablet (5 mg total) by mouth as directed.  50 tablet  3   No current facility-administered medications for this visit.    Allergies  Allergen Reactions  . Aspirin   . Dimetapp Cold-Allergy   . Ilosone (Erythromycin Estolate)   . Morphine And Related   . Penicillins   . Restoril   . Zolpidem Tartrate     Patient Active  Problem List   Diagnosis Date Noted  . Malaise and fatigue 12/14/2010  . Chest pain 11/25/2010  . Atrial fibrillation   . Coronary artery disease   . Encounter for long-term (current) use of anticoagulants   . Diabetes mellitus   . Dyslipidemia   . Ischemia of heart, chronic   . Diastolic dysfunction   . Pulmonary hypertension     History  Smoking status  . Never Smoker   Smokeless tobacco  . Not on file    History  Alcohol Use No    Family History  Problem Relation Age of Onset  . Heart attack Mother   . Transient ischemic attack Father     Review of Systems: Constitutional: no fever chills diaphoresis or fatigue or change in weight.  Head and neck: no hearing loss, no epistaxis, no photophobia or visual disturbance. Respiratory: No cough, shortness of breath or wheezing. Cardiovascular: No chest pain peripheral edema, palpitations. Gastrointestinal: No abdominal distention, no abdominal pain,  no change in bowel habits hematochezia or melena. Genitourinary: No dysuria, no frequency, no urgency, no nocturia. Musculoskeletal:No arthralgias, no back pain, no gait disturbance or myalgias. Neurological: No dizziness, no headaches, no numbness, no seizures, no syncope, no weakness, no tremors. Hematologic: No lymphadenopathy, no easy bruising. Psychiatric: No confusion, no hallucinations, no sleep disturbance.    Physical Exam: Filed Vitals:   10/24/12 1105  BP: 112/66  Pulse: 60   the general appearance reveals a well-developed well-nourished middle-aged woman in no distress.The head and neck exam reveals pupils equal and reactive.  Extraocular movements are full.  There is no scleral icterus.  The mouth and pharynx are normal.  The neck is supple.  The carotids reveal no bruits.  The jugular venous pressure is normal.  The  thyroid is not enlarged.  There is no lymphadenopathy.  The chest is clear to percussion and auscultation.  There are no rales or rhonchi.  Expansion of the chest is symmetrical.  The precordium is quiet.  The first heart sound is normal.  The second heart sound is physiologically split.  There is no murmur gallop rub or click.  There is no abnormal lift or heave.  The abdomen is soft and nontender.  The bowel sounds are normal.  The liver and spleen are not enlarged.  There are no abdominal masses.  There are no abdominal bruits.  Extremities reveal good pedal pulses.  There is mild pretibial edema.  There is no cyanosis or clubbing.  Strength is normal and symmetrical in all extremities.  There is no lateralizing weakness.  There are no sensory deficits.  The skin is warm and dry.  There is no rash.     Assessment / Plan: Continue same medication.  Continue present diet.  Recheck in 4 months for office visit EKG lipid panel hepatic function panel and basal metabolic panel.  We reviewed her labs done several weeks ago which were satisfactory.

## 2012-10-24 NOTE — Assessment & Plan Note (Signed)
The patient has not been experiencing any chest pain.  Has not had to take any sublingual nitroglycerin.

## 2012-10-24 NOTE — Assessment & Plan Note (Signed)
The patient has not been having any hypoglycemic episodes 

## 2012-10-24 NOTE — Assessment & Plan Note (Signed)
The patient has not been having any increase in diastolic heart failure.  She has mild ankle edema which is chronic.  She's not having any paroxysmal nocturnal dyspnea.  She remains on daily Lasix

## 2012-12-25 ENCOUNTER — Ambulatory Visit (INDEPENDENT_AMBULATORY_CARE_PROVIDER_SITE_OTHER): Payer: Medicare Other | Admitting: *Deleted

## 2012-12-25 DIAGNOSIS — Z7901 Long term (current) use of anticoagulants: Secondary | ICD-10-CM

## 2012-12-25 DIAGNOSIS — I4891 Unspecified atrial fibrillation: Secondary | ICD-10-CM

## 2012-12-25 LAB — POCT INR: INR: 1.5

## 2012-12-25 MED ORDER — WARFARIN SODIUM 5 MG PO TABS: 5.0000 mg | ORAL_TABLET | ORAL | Status: DC

## 2013-01-10 ENCOUNTER — Other Ambulatory Visit: Payer: Self-pay

## 2013-01-10 DIAGNOSIS — I480 Paroxysmal atrial fibrillation: Secondary | ICD-10-CM

## 2013-01-10 MED ORDER — FUROSEMIDE 80 MG PO TABS: ORAL_TABLET | ORAL | Status: DC

## 2013-01-10 MED ORDER — METOPROLOL TARTRATE 25 MG PO TABS: ORAL_TABLET | ORAL | Status: DC

## 2013-01-10 MED ORDER — BENAZEPRIL HCL 20 MG PO TABS: 20.0000 mg | ORAL_TABLET | Freq: Every day | ORAL | Status: DC

## 2013-01-10 MED ORDER — ATORVASTATIN CALCIUM 10 MG PO TABS: 10.0000 mg | ORAL_TABLET | Freq: Every day | ORAL | Status: DC

## 2013-01-11 ENCOUNTER — Ambulatory Visit (INDEPENDENT_AMBULATORY_CARE_PROVIDER_SITE_OTHER): Payer: Medicare Other | Admitting: *Deleted

## 2013-01-11 DIAGNOSIS — I4891 Unspecified atrial fibrillation: Secondary | ICD-10-CM

## 2013-01-11 DIAGNOSIS — Z7901 Long term (current) use of anticoagulants: Secondary | ICD-10-CM

## 2013-01-11 LAB — POCT INR: INR: 2

## 2013-01-11 MED ORDER — WARFARIN SODIUM 5 MG PO TABS: ORAL_TABLET | ORAL | Status: DC

## 2013-02-08 ENCOUNTER — Ambulatory Visit (INDEPENDENT_AMBULATORY_CARE_PROVIDER_SITE_OTHER): Payer: Medicare Other | Admitting: Pharmacist

## 2013-02-08 DIAGNOSIS — Z7901 Long term (current) use of anticoagulants: Secondary | ICD-10-CM

## 2013-02-08 DIAGNOSIS — I4891 Unspecified atrial fibrillation: Secondary | ICD-10-CM

## 2013-02-21 ENCOUNTER — Ambulatory Visit (INDEPENDENT_AMBULATORY_CARE_PROVIDER_SITE_OTHER): Payer: Medicare Other | Admitting: *Deleted

## 2013-02-21 DIAGNOSIS — I4891 Unspecified atrial fibrillation: Secondary | ICD-10-CM

## 2013-02-21 DIAGNOSIS — Z7901 Long term (current) use of anticoagulants: Secondary | ICD-10-CM

## 2013-03-12 ENCOUNTER — Ambulatory Visit (INDEPENDENT_AMBULATORY_CARE_PROVIDER_SITE_OTHER): Payer: Medicare Other | Admitting: *Deleted

## 2013-03-12 DIAGNOSIS — I4891 Unspecified atrial fibrillation: Secondary | ICD-10-CM

## 2013-03-12 DIAGNOSIS — Z7901 Long term (current) use of anticoagulants: Secondary | ICD-10-CM

## 2013-03-12 LAB — POCT INR: INR: 5.5

## 2013-03-19 IMAGING — CR DG CHEST 2V
2 series · 2 of 2 positions shown · non-contrast
Comparison: 04/27/2006

CLINICAL DATA: precardiac catheterization

CHEST - 2 VIEW

[w chest pa]
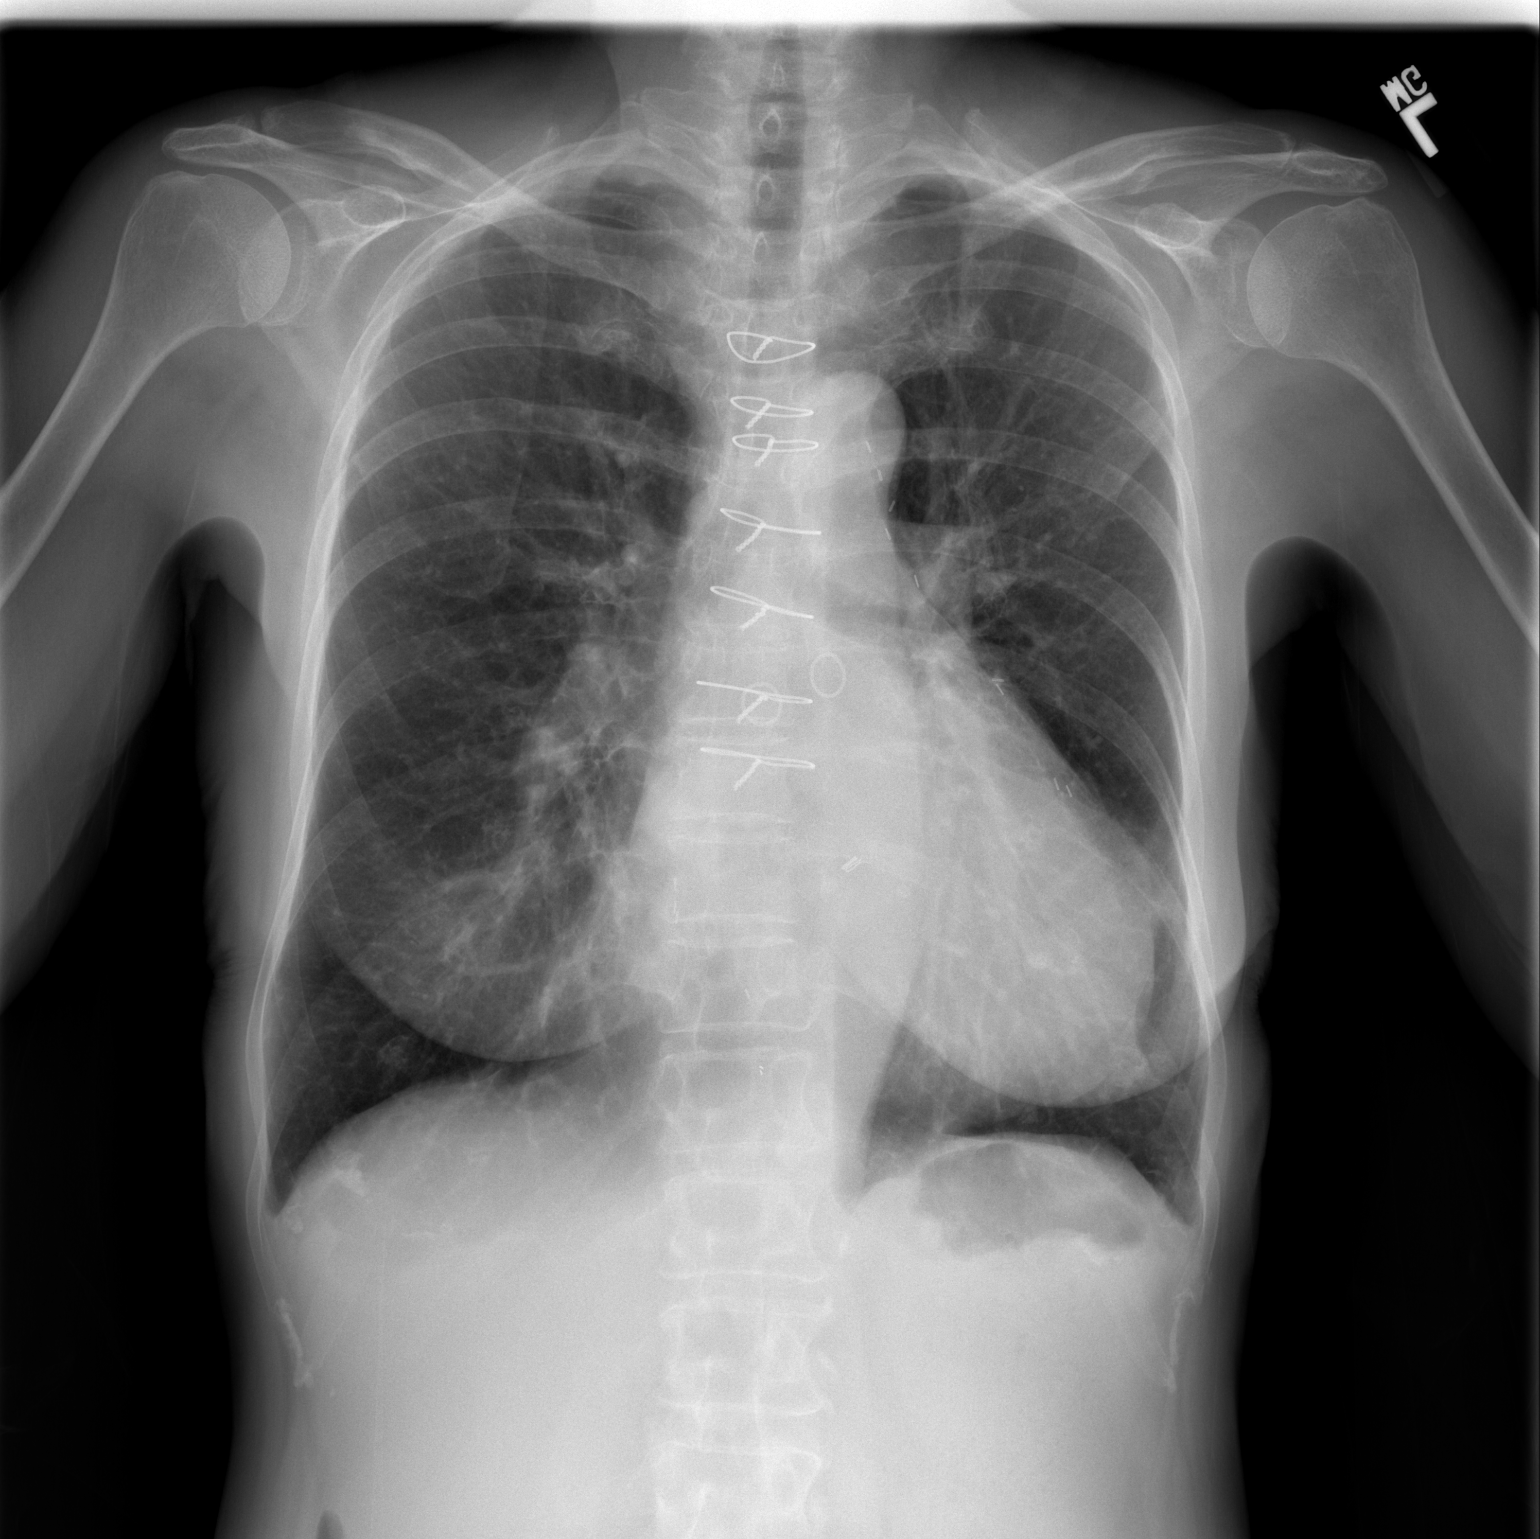

[w chest lat]
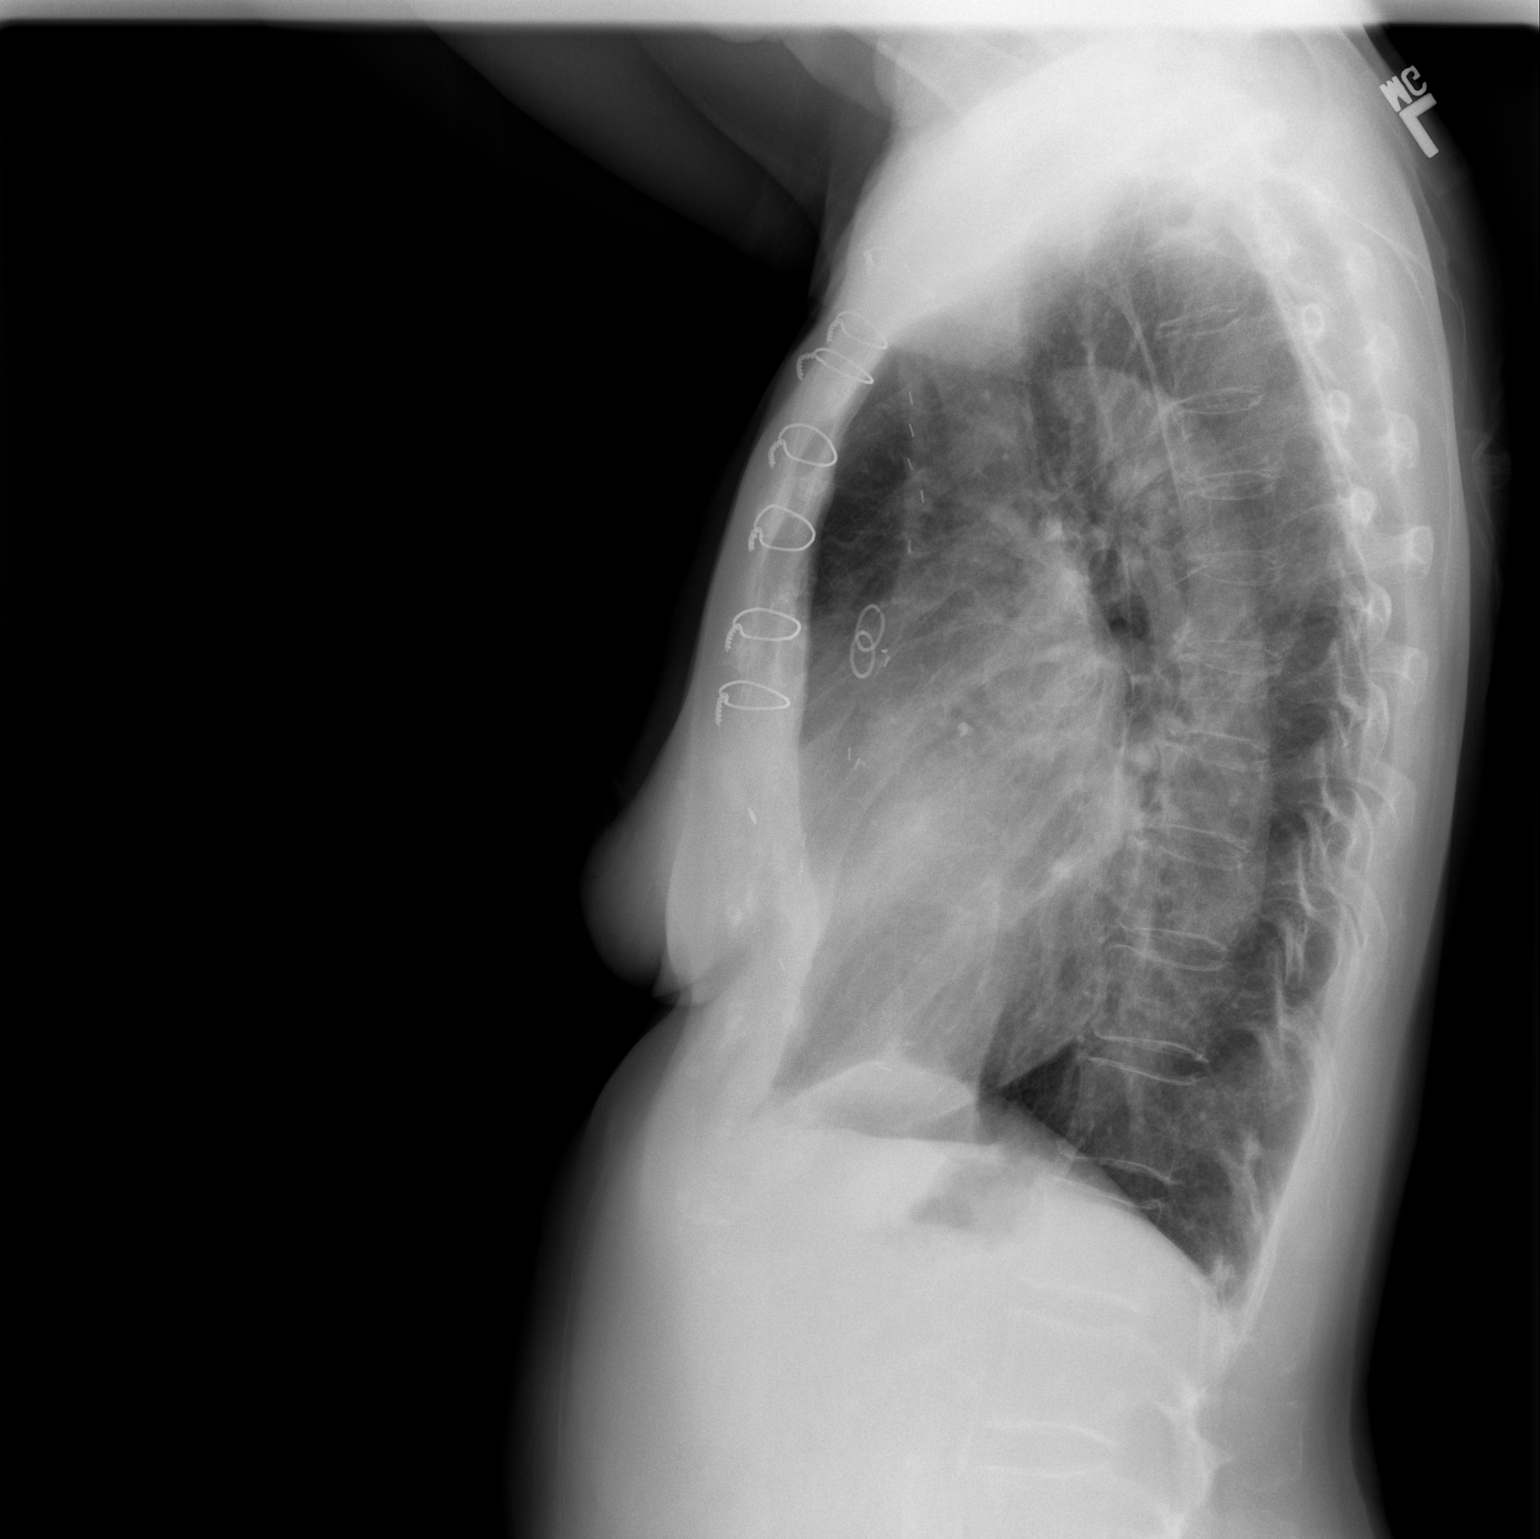

[2 of 2 positions shown; findings below may reference images not displayed]

FINDINGS: The patient is status post median sternotomy and CABG
procedure.

There is moderate cardiac enlargement.

No airspace consolidation identified.

There are coarsened interstitial markings identified bilaterally.

No focal bony abnormalities identified.
IMPRESSION: 1.  No acute cardiopulmonary abnormalities.
2.  Cardiac enlargement
3.  Chronic interstitial coarsening compatible with COPD.

## 2013-03-21 ENCOUNTER — Ambulatory Visit (INDEPENDENT_AMBULATORY_CARE_PROVIDER_SITE_OTHER): Payer: Medicare Other | Admitting: *Deleted

## 2013-03-21 DIAGNOSIS — Z7901 Long term (current) use of anticoagulants: Secondary | ICD-10-CM

## 2013-03-21 DIAGNOSIS — I4891 Unspecified atrial fibrillation: Secondary | ICD-10-CM

## 2013-03-21 LAB — POCT INR: INR: 1.8

## 2013-04-03 ENCOUNTER — Ambulatory Visit (INDEPENDENT_AMBULATORY_CARE_PROVIDER_SITE_OTHER): Payer: Medicare Other | Admitting: Pharmacist

## 2013-04-03 DIAGNOSIS — Z7901 Long term (current) use of anticoagulants: Secondary | ICD-10-CM

## 2013-04-03 DIAGNOSIS — I4891 Unspecified atrial fibrillation: Secondary | ICD-10-CM

## 2013-04-03 LAB — POCT INR: INR: 2.4

## 2013-04-24 ENCOUNTER — Ambulatory Visit (INDEPENDENT_AMBULATORY_CARE_PROVIDER_SITE_OTHER): Payer: Medicare Other | Admitting: *Deleted

## 2013-04-24 DIAGNOSIS — I4891 Unspecified atrial fibrillation: Secondary | ICD-10-CM

## 2013-04-24 DIAGNOSIS — Z7901 Long term (current) use of anticoagulants: Secondary | ICD-10-CM

## 2013-04-24 LAB — POCT INR: INR: 1.3

## 2013-05-02 ENCOUNTER — Ambulatory Visit (INDEPENDENT_AMBULATORY_CARE_PROVIDER_SITE_OTHER): Payer: Medicare Other | Admitting: *Deleted

## 2013-05-02 ENCOUNTER — Ambulatory Visit (INDEPENDENT_AMBULATORY_CARE_PROVIDER_SITE_OTHER): Payer: Medicare Other | Admitting: Ophthalmology

## 2013-05-02 DIAGNOSIS — E1165 Type 2 diabetes mellitus with hyperglycemia: Secondary | ICD-10-CM

## 2013-05-02 DIAGNOSIS — H43819 Vitreous degeneration, unspecified eye: Secondary | ICD-10-CM

## 2013-05-02 DIAGNOSIS — H35039 Hypertensive retinopathy, unspecified eye: Secondary | ICD-10-CM

## 2013-05-02 DIAGNOSIS — Z7901 Long term (current) use of anticoagulants: Secondary | ICD-10-CM

## 2013-05-02 DIAGNOSIS — E11319 Type 2 diabetes mellitus with unspecified diabetic retinopathy without macular edema: Secondary | ICD-10-CM

## 2013-05-02 DIAGNOSIS — E1139 Type 2 diabetes mellitus with other diabetic ophthalmic complication: Secondary | ICD-10-CM

## 2013-05-02 DIAGNOSIS — H3530 Unspecified macular degeneration: Secondary | ICD-10-CM

## 2013-05-02 DIAGNOSIS — I4891 Unspecified atrial fibrillation: Secondary | ICD-10-CM

## 2013-05-02 DIAGNOSIS — Z5181 Encounter for therapeutic drug level monitoring: Secondary | ICD-10-CM

## 2013-05-02 DIAGNOSIS — I10 Essential (primary) hypertension: Secondary | ICD-10-CM

## 2013-05-02 LAB — POCT INR: INR: 2.1

## 2013-05-03 ENCOUNTER — Other Ambulatory Visit: Payer: Self-pay | Admitting: Cardiology

## 2013-05-03 DIAGNOSIS — F419 Anxiety disorder, unspecified: Secondary | ICD-10-CM

## 2013-05-30 ENCOUNTER — Ambulatory Visit (INDEPENDENT_AMBULATORY_CARE_PROVIDER_SITE_OTHER): Payer: Medicare Other | Admitting: Pharmacist

## 2013-05-30 DIAGNOSIS — I4891 Unspecified atrial fibrillation: Secondary | ICD-10-CM

## 2013-05-30 DIAGNOSIS — Z7901 Long term (current) use of anticoagulants: Secondary | ICD-10-CM

## 2013-05-30 DIAGNOSIS — Z5181 Encounter for therapeutic drug level monitoring: Secondary | ICD-10-CM

## 2013-05-30 LAB — POCT INR: INR: 1.4

## 2013-06-06 ENCOUNTER — Ambulatory Visit (INDEPENDENT_AMBULATORY_CARE_PROVIDER_SITE_OTHER): Payer: Medicare Other | Admitting: *Deleted

## 2013-06-06 DIAGNOSIS — I4891 Unspecified atrial fibrillation: Secondary | ICD-10-CM

## 2013-06-06 DIAGNOSIS — Z5181 Encounter for therapeutic drug level monitoring: Secondary | ICD-10-CM

## 2013-06-06 DIAGNOSIS — Z7901 Long term (current) use of anticoagulants: Secondary | ICD-10-CM

## 2013-06-06 LAB — POCT INR: INR: 1.2

## 2013-06-12 ENCOUNTER — Telehealth: Payer: Self-pay | Admitting: Cardiology

## 2013-06-12 NOTE — Telephone Encounter (Signed)
New message    Pt husband calling from [place] .     Wife an order for coumadin check .    [phone]  -fax #  [facility] .   [phone] - phone #

## 2013-06-12 NOTE — Telephone Encounter (Signed)
Spoke with Karen Arroyo, informed him that Rx was faxed to [place] for INR draw tomorrow. Pt will report on 06/13/13 for PT/INR.

## 2013-06-12 NOTE — Telephone Encounter (Signed)
Will forward to coumadin clinic.

## 2013-06-13 LAB — PROTIME-INR: INR: 2.4 — AB (ref 0.9–1.1)

## 2013-06-14 ENCOUNTER — Ambulatory Visit (INDEPENDENT_AMBULATORY_CARE_PROVIDER_SITE_OTHER): Payer: Medicare Other | Admitting: Internal Medicine

## 2013-06-14 DIAGNOSIS — I4891 Unspecified atrial fibrillation: Secondary | ICD-10-CM

## 2013-06-14 DIAGNOSIS — Z5181 Encounter for therapeutic drug level monitoring: Secondary | ICD-10-CM

## 2013-06-26 ENCOUNTER — Ambulatory Visit (INDEPENDENT_AMBULATORY_CARE_PROVIDER_SITE_OTHER): Payer: Medicare Other

## 2013-06-26 DIAGNOSIS — Z7901 Long term (current) use of anticoagulants: Secondary | ICD-10-CM

## 2013-06-26 DIAGNOSIS — I4891 Unspecified atrial fibrillation: Secondary | ICD-10-CM

## 2013-06-26 DIAGNOSIS — Z5181 Encounter for therapeutic drug level monitoring: Secondary | ICD-10-CM

## 2013-06-26 LAB — POCT INR: INR: 1.2

## 2013-07-10 ENCOUNTER — Ambulatory Visit (INDEPENDENT_AMBULATORY_CARE_PROVIDER_SITE_OTHER): Payer: Medicare Other | Admitting: Pharmacist Clinician (PhC)/ Clinical Pharmacy Specialist

## 2013-07-10 DIAGNOSIS — Z5181 Encounter for therapeutic drug level monitoring: Secondary | ICD-10-CM

## 2013-07-10 DIAGNOSIS — Z7901 Long term (current) use of anticoagulants: Secondary | ICD-10-CM

## 2013-07-10 DIAGNOSIS — I4891 Unspecified atrial fibrillation: Secondary | ICD-10-CM

## 2013-07-10 LAB — POCT INR: INR: 1.3

## 2013-07-24 ENCOUNTER — Ambulatory Visit (INDEPENDENT_AMBULATORY_CARE_PROVIDER_SITE_OTHER): Payer: Medicare Other | Admitting: *Deleted

## 2013-07-24 DIAGNOSIS — Z7901 Long term (current) use of anticoagulants: Secondary | ICD-10-CM

## 2013-07-24 DIAGNOSIS — Z5181 Encounter for therapeutic drug level monitoring: Secondary | ICD-10-CM

## 2013-07-24 DIAGNOSIS — I4891 Unspecified atrial fibrillation: Secondary | ICD-10-CM

## 2013-07-24 LAB — POCT INR: INR: 1.4

## 2013-08-07 ENCOUNTER — Ambulatory Visit (INDEPENDENT_AMBULATORY_CARE_PROVIDER_SITE_OTHER): Payer: Medicare Other | Admitting: *Deleted

## 2013-08-07 DIAGNOSIS — Z7901 Long term (current) use of anticoagulants: Secondary | ICD-10-CM

## 2013-08-07 DIAGNOSIS — Z5181 Encounter for therapeutic drug level monitoring: Secondary | ICD-10-CM

## 2013-08-07 DIAGNOSIS — I4891 Unspecified atrial fibrillation: Secondary | ICD-10-CM

## 2013-08-07 LAB — PROTIME-INR
INR: 7.2 ratio (ref 0.8–1.0)
Prothrombin Time: 75.6 s (ref 9.6–13.1)

## 2013-08-07 LAB — POCT INR: INR: 7.2

## 2013-08-09 ENCOUNTER — Ambulatory Visit (INDEPENDENT_AMBULATORY_CARE_PROVIDER_SITE_OTHER): Payer: Medicare Other | Admitting: Pharmacist

## 2013-08-09 DIAGNOSIS — Z7901 Long term (current) use of anticoagulants: Secondary | ICD-10-CM

## 2013-08-09 DIAGNOSIS — Z5181 Encounter for therapeutic drug level monitoring: Secondary | ICD-10-CM

## 2013-08-09 DIAGNOSIS — I4891 Unspecified atrial fibrillation: Secondary | ICD-10-CM

## 2013-08-09 LAB — POCT INR: INR: 3.1

## 2013-08-17 ENCOUNTER — Ambulatory Visit (INDEPENDENT_AMBULATORY_CARE_PROVIDER_SITE_OTHER): Payer: Medicare Other | Admitting: *Deleted

## 2013-08-17 DIAGNOSIS — I4891 Unspecified atrial fibrillation: Secondary | ICD-10-CM

## 2013-08-17 DIAGNOSIS — Z5181 Encounter for therapeutic drug level monitoring: Secondary | ICD-10-CM

## 2013-08-17 DIAGNOSIS — Z7901 Long term (current) use of anticoagulants: Secondary | ICD-10-CM

## 2013-08-17 LAB — POCT INR: INR: 1.9

## 2013-09-04 ENCOUNTER — Encounter: Payer: Self-pay | Admitting: Cardiology

## 2013-09-05 ENCOUNTER — Ambulatory Visit (INDEPENDENT_AMBULATORY_CARE_PROVIDER_SITE_OTHER): Payer: Medicare Other | Admitting: *Deleted

## 2013-09-05 ENCOUNTER — Encounter: Payer: Self-pay | Admitting: Cardiology

## 2013-09-05 DIAGNOSIS — Z7901 Long term (current) use of anticoagulants: Secondary | ICD-10-CM

## 2013-09-05 DIAGNOSIS — Z5181 Encounter for therapeutic drug level monitoring: Secondary | ICD-10-CM

## 2013-09-05 DIAGNOSIS — I4891 Unspecified atrial fibrillation: Secondary | ICD-10-CM

## 2013-09-05 LAB — POCT INR: INR: 1.6

## 2013-09-06 ENCOUNTER — Other Ambulatory Visit: Payer: Self-pay | Admitting: Internal Medicine

## 2013-09-06 DIAGNOSIS — Z1231 Encounter for screening mammogram for malignant neoplasm of breast: Secondary | ICD-10-CM

## 2013-09-26 ENCOUNTER — Ambulatory Visit (INDEPENDENT_AMBULATORY_CARE_PROVIDER_SITE_OTHER): Payer: Medicare Other | Admitting: *Deleted

## 2013-09-26 DIAGNOSIS — I4891 Unspecified atrial fibrillation: Secondary | ICD-10-CM

## 2013-09-26 DIAGNOSIS — Z7901 Long term (current) use of anticoagulants: Secondary | ICD-10-CM

## 2013-09-26 DIAGNOSIS — Z5181 Encounter for therapeutic drug level monitoring: Secondary | ICD-10-CM

## 2013-09-26 LAB — POCT INR: INR: 2.4

## 2013-10-11 ENCOUNTER — Ambulatory Visit: Payer: Medicare Other

## 2013-10-22 ENCOUNTER — Ambulatory Visit (INDEPENDENT_AMBULATORY_CARE_PROVIDER_SITE_OTHER): Payer: Medicare Other | Admitting: Surgery

## 2013-10-22 DIAGNOSIS — I4891 Unspecified atrial fibrillation: Secondary | ICD-10-CM

## 2013-10-22 DIAGNOSIS — Z5181 Encounter for therapeutic drug level monitoring: Secondary | ICD-10-CM

## 2013-10-22 DIAGNOSIS — Z7901 Long term (current) use of anticoagulants: Secondary | ICD-10-CM

## 2013-10-22 LAB — POCT INR: INR: 1.5

## 2013-10-25 ENCOUNTER — Other Ambulatory Visit: Payer: Self-pay | Admitting: Cardiology

## 2013-11-05 ENCOUNTER — Ambulatory Visit (INDEPENDENT_AMBULATORY_CARE_PROVIDER_SITE_OTHER): Payer: Medicare Other | Admitting: Pharmacist

## 2013-11-05 DIAGNOSIS — Z5181 Encounter for therapeutic drug level monitoring: Secondary | ICD-10-CM

## 2013-11-05 DIAGNOSIS — Z7901 Long term (current) use of anticoagulants: Secondary | ICD-10-CM

## 2013-11-05 DIAGNOSIS — I4891 Unspecified atrial fibrillation: Secondary | ICD-10-CM

## 2013-11-05 LAB — POCT INR: INR: 1.5

## 2013-11-13 ENCOUNTER — Ambulatory Visit (INDEPENDENT_AMBULATORY_CARE_PROVIDER_SITE_OTHER): Payer: Medicare Other | Admitting: Pharmacist Clinician (PhC)/ Clinical Pharmacy Specialist

## 2013-11-13 DIAGNOSIS — Z7901 Long term (current) use of anticoagulants: Secondary | ICD-10-CM

## 2013-11-13 DIAGNOSIS — Z5181 Encounter for therapeutic drug level monitoring: Secondary | ICD-10-CM

## 2013-11-13 DIAGNOSIS — I4891 Unspecified atrial fibrillation: Secondary | ICD-10-CM

## 2013-11-13 LAB — POCT INR: INR: 2.5

## 2013-12-11 ENCOUNTER — Ambulatory Visit (INDEPENDENT_AMBULATORY_CARE_PROVIDER_SITE_OTHER): Payer: Medicare Other | Admitting: Pharmacist

## 2013-12-11 DIAGNOSIS — I4891 Unspecified atrial fibrillation: Secondary | ICD-10-CM

## 2013-12-11 DIAGNOSIS — Z7901 Long term (current) use of anticoagulants: Secondary | ICD-10-CM

## 2013-12-11 DIAGNOSIS — Z5181 Encounter for therapeutic drug level monitoring: Secondary | ICD-10-CM

## 2013-12-11 LAB — POCT INR: INR: 1.4

## 2014-01-08 ENCOUNTER — Ambulatory Visit (INDEPENDENT_AMBULATORY_CARE_PROVIDER_SITE_OTHER): Payer: Medicare Other | Admitting: *Deleted

## 2014-01-08 DIAGNOSIS — Z5181 Encounter for therapeutic drug level monitoring: Secondary | ICD-10-CM

## 2014-01-08 DIAGNOSIS — Z7901 Long term (current) use of anticoagulants: Secondary | ICD-10-CM

## 2014-01-08 DIAGNOSIS — I4891 Unspecified atrial fibrillation: Secondary | ICD-10-CM

## 2014-01-08 LAB — POCT INR: INR: 1.8

## 2014-01-28 ENCOUNTER — Ambulatory Visit (INDEPENDENT_AMBULATORY_CARE_PROVIDER_SITE_OTHER): Payer: Medicare Other

## 2014-01-28 DIAGNOSIS — Z5181 Encounter for therapeutic drug level monitoring: Secondary | ICD-10-CM

## 2014-01-28 DIAGNOSIS — I4891 Unspecified atrial fibrillation: Secondary | ICD-10-CM

## 2014-01-28 DIAGNOSIS — Z7901 Long term (current) use of anticoagulants: Secondary | ICD-10-CM

## 2014-01-28 LAB — POCT INR: INR: 2.2

## 2014-01-29 ENCOUNTER — Other Ambulatory Visit: Payer: Self-pay | Admitting: Cardiology

## 2014-01-29 DIAGNOSIS — F419 Anxiety disorder, unspecified: Secondary | ICD-10-CM

## 2014-02-01 ENCOUNTER — Other Ambulatory Visit: Payer: Self-pay | Admitting: Cardiology

## 2014-02-01 DIAGNOSIS — F419 Anxiety disorder, unspecified: Secondary | ICD-10-CM

## 2014-02-07 NOTE — Telephone Encounter (Signed)
Pharmacy did not get refill previously done, phoned in

## 2014-02-14 ENCOUNTER — Other Ambulatory Visit: Payer: Self-pay

## 2014-02-14 MED ORDER — BENAZEPRIL HCL 20 MG PO TABS
ORAL_TABLET | ORAL | Status: DC
Start: 2014-02-14 — End: 2015-04-28

## 2014-03-06 ENCOUNTER — Ambulatory Visit (INDEPENDENT_AMBULATORY_CARE_PROVIDER_SITE_OTHER): Payer: Medicare Other | Admitting: Pharmacist

## 2014-03-06 DIAGNOSIS — Z5181 Encounter for therapeutic drug level monitoring: Secondary | ICD-10-CM

## 2014-03-06 DIAGNOSIS — Z7901 Long term (current) use of anticoagulants: Secondary | ICD-10-CM

## 2014-03-06 DIAGNOSIS — I4891 Unspecified atrial fibrillation: Secondary | ICD-10-CM

## 2014-03-06 LAB — POCT INR: INR: 1.3

## 2014-03-11 ENCOUNTER — Encounter (INDEPENDENT_AMBULATORY_CARE_PROVIDER_SITE_OTHER): Payer: Medicare Other | Admitting: Ophthalmology

## 2014-03-13 ENCOUNTER — Encounter (INDEPENDENT_AMBULATORY_CARE_PROVIDER_SITE_OTHER): Payer: Medicare Other | Admitting: Ophthalmology

## 2014-03-13 DIAGNOSIS — E11319 Type 2 diabetes mellitus with unspecified diabetic retinopathy without macular edema: Secondary | ICD-10-CM | POA: Diagnosis not present

## 2014-03-13 DIAGNOSIS — H3531 Nonexudative age-related macular degeneration: Secondary | ICD-10-CM | POA: Diagnosis not present

## 2014-03-13 DIAGNOSIS — H35033 Hypertensive retinopathy, bilateral: Secondary | ICD-10-CM

## 2014-03-13 DIAGNOSIS — E11329 Type 2 diabetes mellitus with mild nonproliferative diabetic retinopathy without macular edema: Secondary | ICD-10-CM

## 2014-03-13 DIAGNOSIS — I10 Essential (primary) hypertension: Secondary | ICD-10-CM | POA: Diagnosis not present

## 2014-03-13 DIAGNOSIS — H43813 Vitreous degeneration, bilateral: Secondary | ICD-10-CM

## 2014-04-15 ENCOUNTER — Ambulatory Visit (INDEPENDENT_AMBULATORY_CARE_PROVIDER_SITE_OTHER): Payer: Medicare Other

## 2014-04-15 ENCOUNTER — Encounter: Payer: Self-pay | Admitting: Cardiology

## 2014-04-15 ENCOUNTER — Ambulatory Visit (INDEPENDENT_AMBULATORY_CARE_PROVIDER_SITE_OTHER): Payer: Medicare Other | Admitting: Cardiology

## 2014-04-15 VITALS — BP 130/82 | HR 82 | Ht 64.0 in | Wt 162.8 lb

## 2014-04-15 DIAGNOSIS — I259 Chronic ischemic heart disease, unspecified: Secondary | ICD-10-CM

## 2014-04-15 DIAGNOSIS — I5189 Other ill-defined heart diseases: Secondary | ICD-10-CM

## 2014-04-15 DIAGNOSIS — I4891 Unspecified atrial fibrillation: Secondary | ICD-10-CM

## 2014-04-15 DIAGNOSIS — Z7901 Long term (current) use of anticoagulants: Secondary | ICD-10-CM

## 2014-04-15 DIAGNOSIS — Z5181 Encounter for therapeutic drug level monitoring: Secondary | ICD-10-CM

## 2014-04-15 DIAGNOSIS — E7800 Pure hypercholesterolemia, unspecified: Secondary | ICD-10-CM

## 2014-04-15 DIAGNOSIS — I519 Heart disease, unspecified: Secondary | ICD-10-CM

## 2014-04-15 LAB — POCT INR: INR: 1.4

## 2014-04-15 NOTE — Progress Notes (Signed)
Karen Arroyo Date of Birth:  May 24, 1933 [facility] [address] [place], [name]  [address] [phone]        Fax   [phone]   History of Present Illness: This pleasant 81 year old woman is seen for a scheduled followup office visit. She has a history of ischemic heart disease and had coronary artery bypass graft surgery in 2000. In August 2012 she had recurrent angina pectoris and underwent cardiac catheterization which did not show any new lesions. Since then she has had very little in the way of chest discomfort. The patient also has a history of diastolic dysfunction, pulmonary hypertension, hypercholesterolemia, and diabetes. She has had a history of intermittent atrial fib and is on long-term Coumadin. Since last visit she's had no new cardiac symptoms. She was having more symptoms of arthritis with painful right knee with motion but does not intend to have any evaluation or surgery at this time.  She has 2 young grandchildren down in [place].  She does a lot of babysitting for them.  Current Outpatient Prescriptions  Medication Sig Dispense Refill  . ALPRAZolam (XANAX) 0.25 MG tablet TAKE 1 TABLET BY MOUTH EVERY 6 HOURS AS NEEDED (Patient taking differently: TAKE 1 TABLET BY MOUTH EVERY 6 HOURS AS NEEDED FOR ANXIETY) 30 tablet 5  . ammonium lactate (AMLACTIN) 12 % cream Apply 1 g topically as directed.     Marland Kitchen atorvastatin (LIPITOR) 10 MG tablet TAKE 1 TABLET BY MOUTH DAILY 30 tablet 1  . benazepril (LOTENSIN) 20 MG tablet TAKE 1 TABLET BY MOUTH DAILY 60 tablet 1  . Calcium-Vitamin D (CALTRATE 600 PLUS-VIT D PO) Take 1 tablet by mouth 2 (two) times daily.      . citalopram (CELEXA) 20 MG tablet Take 1 tablet by mouth Daily.    . fexofenadine (ALLEGRA) 60 MG tablet Take 60 mg by mouth daily as needed for allergies or rhinitis.     . furosemide (LASIX) 40 MG tablet Take 40 mg by mouth every morning. Alternates with 80 mg by mouth daily  6  .  furosemide (LASIX) 80 MG tablet TAKE 1 TABLET EVERY DAY OR AS DIRECTED (Patient taking differently: TAKE 1 TABLET BY MOUTH EVERY DAY OR AS DIRECTED Alternates with 80 mg by mouth daily) 60 tablet 3  . glimepiride (AMARYL) 2 MG tablet Take 1 mg by mouth daily before breakfast.      . metFORMIN (GLUCOPHAGE-XR) 500 MG 24 hr tablet Take 500 mg by mouth 2 (two) times daily.      . metoprolol tartrate (LOPRESSOR) 25 MG tablet 1/2 TABLET TWICE A DAY (Patient taking differently: TAKE 1/2 TABLET BY MOUTH TWICE A DAY) 30 tablet 1  . Multiple Vitamins-Minerals (MULTIVITAL PO) Take 1 capsule by mouth daily.     . nitroGLYCERIN (NITROSTAT) 0.4 MG SL tablet Place 1 tablet (0.4 mg total) under the tongue every 5 (five) minutes as needed. (Patient taking differently: Place 0.4 mg under the tongue every 5 (five) minutes as needed for chest pain (MAX 3 TABLETS). ) 25 tablet 6  . omeprazole (PRILOSEC) 20 MG capsule TAKE ONE CAPSULE BY MOUTH TWICE A DAY 60 capsule 1  . pioglitazone (ACTOS) 45 MG tablet TAKE 1/2 TABLET BY MOUTH EVERY DAY 30 tablet 3  . traZODone (DESYREL) 50 MG tablet Take 50 mg by mouth at bedtime as needed for sleep.    Marland Kitchen warfarin (COUMADIN) 5 MG tablet TAKE AS DIRECTED BY COUMADIN CLINIC 50 tablet  3   No current facility-administered medications for this visit.    Allergies  Allergen Reactions  . Aspirin     Rash - only in high doses  . Dimetapp Cold-Allergy     Rash   . Ilosone [Erythromycin Estolate]     rash  . Morphine And Related     rash  . Penicillins     rash  . Restoril     rash  . Zolpidem Tartrate     rash    Patient Active Problem List   Diagnosis Date Noted  . Encounter for therapeutic drug monitoring 05/02/2013  . Malaise and fatigue 12/14/2010  . Chest pain 11/25/2010  . Atrial fibrillation   . Coronary artery disease   . Encounter for long-term (current) use of anticoagulants   . Type II or unspecified type diabetes mellitus without mention of complication,  uncontrolled   . Dyslipidemia   . Ischemia of heart, chronic   . Diastolic dysfunction   . Pulmonary hypertension     History  Smoking status  . Never Smoker   Smokeless tobacco  . Not on file    History  Alcohol Use No    Family History  Problem Relation Age of Onset  . Heart attack Mother   . Transient ischemic attack Father     Review of Systems: Constitutional: no fever chills diaphoresis or fatigue or change in weight.  Head and neck: no hearing loss, no epistaxis, no photophobia or visual disturbance. Respiratory: No cough, shortness of breath or wheezing. Cardiovascular: No chest pain peripheral edema, palpitations. Gastrointestinal: No abdominal distention, no abdominal pain, no change in bowel habits hematochezia or melena. Genitourinary: No dysuria, no frequency, no urgency, no nocturia. Musculoskeletal:No arthralgias, no back pain, no gait disturbance or myalgias. Neurological: No dizziness, no headaches, no numbness, no seizures, no syncope, no weakness, no tremors. Hematologic: No lymphadenopathy, no easy bruising. Psychiatric: No confusion, no hallucinations, no sleep disturbance.   Wt Readings from Last 3 Encounters:  04/15/14 162 lb 12.8 oz (73.846 kg)  10/24/12 149 lb 12.8 oz (67.949 kg)  06/30/12 153 lb 9.6 oz (69.673 kg)    Physical Exam: Filed Vitals:   04/15/14 1532  BP: 130/82  Pulse: 82  The patient appears to be in no distress.  Head and neck exam reveals that the pupils are equal and reactive.  The extraocular movements are full.  There is no scleral icterus.  Mouth and pharynx are benign.  No lymphadenopathy.  No carotid bruits.  The jugular venous pressure is normal.  Thyroid is not enlarged or tender.  Chest is clear to percussion and auscultation.  No rales or rhonchi.  Expansion of the chest is symmetrical.  Heart reveals no abnormal lift or heave.  First and second heart sounds are normal.  There is no murmur gallop rub or click.   The pulse is irregularly irregular  The abdomen is soft and nontender.  Bowel sounds are normoactive.  There is no hepatosplenomegaly or mass.  There are no abdominal bruits.  Extremities reveal no phlebitis or edema.  Pedal pulses are good.  There is no cyanosis or clubbing.  Neurologic exam is normal strength and no lateralizing weakness.  No sensory deficits.  Integument reveals no rash  EKG today shows atrial flutter with controlled ventricular response.  There is a incomplete right bundle-branch block present.  Since the previous tracing of 02/26/12, atrial fibrillation is present.  Assessment / Plan: 1.  Paroxysmal atrial fibrillation, essentially  asymptomatic.  She is on long-term Coumadin. 2.  Ischemic heart disease status post CABG in 2000.  She has rare episodes of angina for which she takes sublingual nitroglycerin. 3.  Chronic diastolic heart failure, controlled on medication 4.  Adult-onset diabetes, no hypoglycemic episodes. 5.  Hypercholesterolemia followed by Dr. Dagmar Hait.  Disposition: Continue on current medication.  Now that she is on long-term warfarin there is no indication for her to continue the baby aspirin and she will stop her baby aspirin.  She has had occasional nosebleeds. Recheck in 6 months for office visit and EKG

## 2014-04-15 NOTE — Patient Instructions (Signed)
STOP ASPIRIN   Your physician wants you to follow-up in: 6 MONTH/EKG You will receive a reminder letter in the mail two months in advance. If you don't receive a letter, please call our office to schedule the follow-up appointment.

## 2014-04-16 ENCOUNTER — Telehealth: Payer: Self-pay | Admitting: Cardiology

## 2014-04-16 NOTE — Telephone Encounter (Signed)
No answer

## 2014-04-16 NOTE — Telephone Encounter (Signed)
New problem   Pt stated she is repeated things when she is talking and want to talk to doctor concerning this matter. Pt stated she isn't aware of it. Please call pt.

## 2014-04-18 NOTE — Telephone Encounter (Signed)
Spoke with patient and advised to call PCP, verbalized understanding

## 2014-04-25 ENCOUNTER — Ambulatory Visit (INDEPENDENT_AMBULATORY_CARE_PROVIDER_SITE_OTHER): Payer: Medicare Other

## 2014-04-25 DIAGNOSIS — Z5181 Encounter for therapeutic drug level monitoring: Secondary | ICD-10-CM

## 2014-04-25 DIAGNOSIS — I4891 Unspecified atrial fibrillation: Secondary | ICD-10-CM

## 2014-04-25 DIAGNOSIS — Z7901 Long term (current) use of anticoagulants: Secondary | ICD-10-CM

## 2014-04-25 LAB — POCT INR: INR: 1.5

## 2014-05-03 ENCOUNTER — Ambulatory Visit (INDEPENDENT_AMBULATORY_CARE_PROVIDER_SITE_OTHER): Payer: Medicare Other | Admitting: Ophthalmology

## 2014-05-06 ENCOUNTER — Telehealth: Payer: Self-pay | Admitting: Cardiology

## 2014-05-06 NOTE — Telephone Encounter (Signed)
New message     Pt c/o swelling: STAT is pt has developed SOB within 24 hours  1. How long have you been experiencing swelling? Over the weekend  2. Where is the swelling located? Feet, legs.  Rash on buttocks  3.  Are you currently taking a "fluid pill"? Lasix----  4.  Are you currently SOB? no  5.  Have you traveled recently?4wk ago .  Went to [place], [place] Pt noticed she has gained wt but has not weighed herself

## 2014-05-06 NOTE — Telephone Encounter (Signed)
Called patient back. Patient complaining of swelling in her feet and a rash on her buttocks. Patient has swelling in her feet last night, therefore she took her lasix. Swelling came down this morning, but is still there. Also this morning, patient noticed a rash about 4 inches in diameter on her buttocks. Patient has recently been started on Azithromycin  250 mg. Other then the swelling and rash patient says she is fine. Informed patient I would forward her concerns to Dr. Mare Ferrari for any further instructions. Also, encouraged patient to call PCP for the rash.

## 2014-05-06 NOTE — Telephone Encounter (Signed)
She has been on Lasix longterm.  I doubt whether it is causing her rash.  Agree she should check with her PCP

## 2014-05-07 NOTE — Telephone Encounter (Signed)
Advised patient, will call PCP

## 2014-05-08 ENCOUNTER — Ambulatory Visit (INDEPENDENT_AMBULATORY_CARE_PROVIDER_SITE_OTHER): Payer: Medicare Other | Admitting: Cardiology

## 2014-05-08 ENCOUNTER — Encounter: Payer: Self-pay | Admitting: Cardiology

## 2014-05-08 VITALS — BP 134/90 | HR 102 | Ht 64.0 in | Wt 161.0 lb

## 2014-05-08 DIAGNOSIS — I259 Chronic ischemic heart disease, unspecified: Secondary | ICD-10-CM

## 2014-05-08 DIAGNOSIS — J209 Acute bronchitis, unspecified: Secondary | ICD-10-CM

## 2014-05-08 DIAGNOSIS — I480 Paroxysmal atrial fibrillation: Secondary | ICD-10-CM

## 2014-05-08 NOTE — Progress Notes (Addendum)
Cardiology Office Note   Date:  05/08/2014   ID:  Karen Arroyo, DOB [birth year], MRN [id number]  PCP:  Tivis Ringer, MD  Cardiologist:   Darlin Coco, MD   No chief complaint on file.     History of Present Illness: Karen Arroyo is a 81 y.o. female who presents for follow-up office visit. This pleasant 81 year old woman is seen for a  followup office visit. She has a history of ischemic heart disease and had coronary artery bypass graft surgery in 2000. In August 2012 she had recurrent angina pectoris and underwent cardiac catheterization which did not show any new lesions. Since then she has had very little in the way of chest discomfort. The patient also has a history of diastolic dysfunction, pulmonary hypertension, hypercholesterolemia, and diabetes. She has had a history of intermittent atrial fib and is on long-term Coumadin. Since last visit she's had no new cardiac symptoms. She was having more symptoms of arthritis with painful right knee with motion but does not intend to have any evaluation or surgery at this time. She has 2 young grandchildren down in [place]. She does a lot of babysitting for them.  Since we last saw her she developed chest cold and has been on a ZPak.  She has a resolving macular rash on her lower back bilateral.   Past Medical History  Diagnosis Date  . HX: long term anticoagulant use   . PAF (paroxysmal atrial fibrillation)   . Coronary artery disease   . Diabetes mellitus   . Dyslipidemia   . Pulmonary hypertension   . Diastolic dysfunction   . Skin cancer     nose, leg  . Edema   . 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 S/P CABG (coronary artery bypass graft) 2000    Past Surgical History  Procedure Laterality Date  . Coronary artery bypass graft  2000    LIMA to LAD; SVG to intermediate, SVG to PD and RCA per Dr. Servando Snare  . Tubal ligation  1961  . Other surgical history  1999    historectomy  . Cardiac  catheterization  1995    dr Oran Rein     Current Outpatient Prescriptions  Medication Sig Dispense Refill  . ALPRAZolam (XANAX) 0.25 MG tablet TAKE 1 TABLET BY MOUTH EVERY 6 HOURS AS NEEDED (Patient taking differently: TAKE 1 TABLET BY MOUTH EVERY 6 HOURS AS NEEDED FOR ANXIETY) 30 tablet 5  . ammonium lactate (AMLACTIN) 12 % cream Apply 1 g topically as directed.     Marland Kitchen atorvastatin (LIPITOR) 10 MG tablet TAKE 1 TABLET BY MOUTH DAILY 30 tablet 1  . azithromycin (ZITHROMAX) 250 MG tablet   0  . benazepril (LOTENSIN) 20 MG tablet TAKE 1 TABLET BY MOUTH DAILY 60 tablet 1  . Calcium-Vitamin D (CALTRATE 600 PLUS-VIT D PO) Take 1 tablet by mouth 2 (two) times daily.      . citalopram (CELEXA) 20 MG tablet Take 1 tablet by mouth Daily.    Marland Kitchen donepezil (ARICEPT ODT) 5 MG disintegrating tablet   0  . fexofenadine (ALLEGRA) 60 MG tablet Take 60 mg by mouth daily as needed for allergies or rhinitis.     . furosemide (LASIX) 40 MG tablet Take 40 mg by mouth every morning. Alternates with 80 mg by mouth daily  6  . furosemide (LASIX) 80 MG tablet TAKE 1 TABLET EVERY DAY OR AS DIRECTED (Patient taking differently: TAKE 1 TABLET BY  MOUTH EVERY DAY OR AS DIRECTED Alternates with 80 mg by mouth daily) 60 tablet 3  . glimepiride (AMARYL) 2 MG tablet Take 1 mg by mouth daily before breakfast.      . metFORMIN (GLUCOPHAGE-XR) 500 MG 24 hr tablet Take 500 mg by mouth 2 (two) times daily.      . metoprolol tartrate (LOPRESSOR) 25 MG tablet 1/2 TABLET TWICE A DAY (Patient taking differently: TAKE 1/2 TABLET BY MOUTH TWICE A DAY) 30 tablet 1  . Multiple Vitamins-Minerals (MULTIVITAL PO) Take 1 capsule by mouth daily.     . nitroGLYCERIN (NITROSTAT) 0.4 MG SL tablet Place 1 tablet (0.4 mg total) under the tongue every 5 (five) minutes as needed. (Patient taking differently: Place 0.4 mg under the tongue every 5 (five) minutes as needed for chest pain (MAX 3 TABLETS). ) 25 tablet 6  . omeprazole (PRILOSEC) 20 MG capsule  TAKE ONE CAPSULE BY MOUTH TWICE A DAY 60 capsule 1  . pioglitazone (ACTOS) 45 MG tablet TAKE 1/2 TABLET BY MOUTH EVERY DAY 30 tablet 3  . traZODone (DESYREL) 50 MG tablet Take 50 mg by mouth at bedtime as needed for sleep.    Marland Kitchen warfarin (COUMADIN) 5 MG tablet TAKE AS DIRECTED BY COUMADIN CLINIC 50 tablet 3   No current facility-administered medications for this visit.    Allergies:   Aspirin; Dimetapp cold-allergy; Ilosone; Morphine and related; Penicillins; Restoril; and Zolpidem tartrate    Social History:  The patient  reports that she has never smoked. She does not have any smokeless tobacco history on file. She reports that she does not drink alcohol or use illicit drugs.   Family History:  The patient's family history includes Heart attack in her mother; Transient ischemic attack in her father.    ROS:  Please see the history of present illness.   Otherwise, review of systems are positive for none.   All other systems are reviewed and negative.    PHYSICAL EXAM: VS:  BP 134/90 mmHg  Pulse 102  Ht 5\' 4"  (1.626 m)  Wt 161 lb (73.029 kg)  BMI 27.62 kg/m2 , BMI Body mass index is 27.62 kg/(m^2). GEN: Well nourished, well developed, in no acute distress HEENT: normal Neck: no JVD, carotid bruits, or masses Cardiac: RRR; no murmurs, rubs, or gallops,no edema  Respiratory:  clear to auscultation bilaterally, normal work of breathing GI: soft, nontender, nondistended, + BS MS: no deformity or atrophy Skin: warm and dry, resolving macular rash on her upper buttocks and low back Neuro:  Strength and sensation are intact Psych: euthymic mood, full affect   EKG:  EKG is not ordered today.    Recent Labs: No results found for requested labs within last 365 days.    Lipid Panel    Component Value Date/Time   CHOL 208* 10/03/2012 0908   TRIG 83.0 10/03/2012 0908   HDL 67.00 10/03/2012 0908   CHOLHDL 3 10/03/2012 0908   VLDL 16.6 10/03/2012 0908   LDLDIRECT 124.9 10/03/2012  0908      Wt Readings from Last 3 Encounters:  05/08/14 161 lb (73.029 kg)  04/15/14 162 lb 12.8 oz (73.846 kg)  10/24/12 149 lb 12.8 oz (67.949 kg)      Other studies Reviewed:    ASSESSMENT AND PLAN:  1. Paroxysmal atrial fibrillation, essentially asymptomatic. She is on long-term Coumadin. 2. Ischemic heart disease status post CABG in 2000. She has rare episodes of angina for which she takes sublingual nitroglycerin. 3. Chronic  diastolic heart failure, controlled on medication 4. Adult-onset diabetes, no hypoglycemic episodes. 5. Hypercholesterolemia followed by Dr. Dagmar Hait. 6.  Recent bronchitis responding to Zithromax 7.  She asked questions about CODE STATUS.  At this point she is a full code.  She has not updated her will in many years.  I suggested that she see an Forensic scientist to update her will and to discuss healthcare power of attorney and advanced directives etc.   Current medicines are reviewed at length with the patient today.  The patient does not have concerns regarding medicines.  The following changes have been made:  no change  Labs/ tests ordered today include: None  No orders of the defined types were placed in this encounter.     Disposition:   FU with Dr. Mare Ferrari in 6 months   Signed, Darlin Coco, MD  05/08/2014 2:04 PM    [facility] [place], [place], [place]  [address] Phone: [phone]; Fax: [phone]

## 2014-05-08 NOTE — Patient Instructions (Signed)
Your physician recommends that you continue on your current medications as directed. Please refer to the Current Medication list given to you today.  Keep your June appointment

## 2014-05-20 ENCOUNTER — Other Ambulatory Visit: Payer: Self-pay | Admitting: Cardiology

## 2014-05-24 ENCOUNTER — Ambulatory Visit (INDEPENDENT_AMBULATORY_CARE_PROVIDER_SITE_OTHER): Payer: Medicare Other | Admitting: *Deleted

## 2014-05-24 DIAGNOSIS — Z5181 Encounter for therapeutic drug level monitoring: Secondary | ICD-10-CM

## 2014-05-24 DIAGNOSIS — I480 Paroxysmal atrial fibrillation: Secondary | ICD-10-CM

## 2014-05-24 DIAGNOSIS — I4891 Unspecified atrial fibrillation: Secondary | ICD-10-CM

## 2014-05-24 DIAGNOSIS — Z7901 Long term (current) use of anticoagulants: Secondary | ICD-10-CM

## 2014-05-24 LAB — POCT INR: INR: 2.2

## 2014-06-11 ENCOUNTER — Ambulatory Visit (INDEPENDENT_AMBULATORY_CARE_PROVIDER_SITE_OTHER): Payer: Medicare Other | Admitting: *Deleted

## 2014-06-11 DIAGNOSIS — I480 Paroxysmal atrial fibrillation: Secondary | ICD-10-CM

## 2014-06-11 DIAGNOSIS — I4891 Unspecified atrial fibrillation: Secondary | ICD-10-CM

## 2014-06-11 DIAGNOSIS — Z7901 Long term (current) use of anticoagulants: Secondary | ICD-10-CM

## 2014-06-11 DIAGNOSIS — Z5181 Encounter for therapeutic drug level monitoring: Secondary | ICD-10-CM

## 2014-06-11 LAB — POCT INR: INR: 3.8

## 2014-06-26 ENCOUNTER — Ambulatory Visit (INDEPENDENT_AMBULATORY_CARE_PROVIDER_SITE_OTHER): Payer: Medicare Other | Admitting: *Deleted

## 2014-06-26 DIAGNOSIS — I480 Paroxysmal atrial fibrillation: Secondary | ICD-10-CM

## 2014-06-26 DIAGNOSIS — I4891 Unspecified atrial fibrillation: Secondary | ICD-10-CM | POA: Diagnosis not present

## 2014-06-26 DIAGNOSIS — Z7901 Long term (current) use of anticoagulants: Secondary | ICD-10-CM

## 2014-06-26 DIAGNOSIS — Z5181 Encounter for therapeutic drug level monitoring: Secondary | ICD-10-CM

## 2014-06-26 LAB — POCT INR: INR: 1.3

## 2014-07-05 ENCOUNTER — Ambulatory Visit (INDEPENDENT_AMBULATORY_CARE_PROVIDER_SITE_OTHER): Payer: Medicare Other

## 2014-07-05 DIAGNOSIS — Z5181 Encounter for therapeutic drug level monitoring: Secondary | ICD-10-CM | POA: Diagnosis not present

## 2014-07-05 DIAGNOSIS — I4891 Unspecified atrial fibrillation: Secondary | ICD-10-CM | POA: Diagnosis not present

## 2014-07-05 DIAGNOSIS — Z7901 Long term (current) use of anticoagulants: Secondary | ICD-10-CM | POA: Diagnosis not present

## 2014-07-05 LAB — POCT INR: INR: 1.1

## 2014-07-12 ENCOUNTER — Ambulatory Visit (INDEPENDENT_AMBULATORY_CARE_PROVIDER_SITE_OTHER): Payer: Medicare Other | Admitting: *Deleted

## 2014-07-12 DIAGNOSIS — I4891 Unspecified atrial fibrillation: Secondary | ICD-10-CM | POA: Diagnosis not present

## 2014-07-12 DIAGNOSIS — Z5181 Encounter for therapeutic drug level monitoring: Secondary | ICD-10-CM

## 2014-07-12 DIAGNOSIS — Z7901 Long term (current) use of anticoagulants: Secondary | ICD-10-CM | POA: Diagnosis not present

## 2014-07-12 LAB — POCT INR: INR: 1.6

## 2014-07-22 ENCOUNTER — Other Ambulatory Visit: Payer: Self-pay | Admitting: Adult Health

## 2014-07-25 ENCOUNTER — Ambulatory Visit (INDEPENDENT_AMBULATORY_CARE_PROVIDER_SITE_OTHER): Payer: Medicare Other | Admitting: *Deleted

## 2014-07-25 DIAGNOSIS — I4891 Unspecified atrial fibrillation: Secondary | ICD-10-CM

## 2014-07-25 DIAGNOSIS — Z5181 Encounter for therapeutic drug level monitoring: Secondary | ICD-10-CM | POA: Diagnosis not present

## 2014-07-25 DIAGNOSIS — Z7901 Long term (current) use of anticoagulants: Secondary | ICD-10-CM

## 2014-07-25 LAB — POCT INR: INR: 2.3

## 2014-07-29 ENCOUNTER — Other Ambulatory Visit: Payer: Self-pay | Admitting: Cardiology

## 2014-08-08 ENCOUNTER — Ambulatory Visit (INDEPENDENT_AMBULATORY_CARE_PROVIDER_SITE_OTHER): Payer: Medicare Other | Admitting: *Deleted

## 2014-08-08 DIAGNOSIS — I4891 Unspecified atrial fibrillation: Secondary | ICD-10-CM | POA: Diagnosis not present

## 2014-08-08 DIAGNOSIS — Z7901 Long term (current) use of anticoagulants: Secondary | ICD-10-CM

## 2014-08-08 DIAGNOSIS — Z5181 Encounter for therapeutic drug level monitoring: Secondary | ICD-10-CM

## 2014-08-08 LAB — POCT INR: INR: 2

## 2014-08-15 ENCOUNTER — Other Ambulatory Visit: Payer: Self-pay | Admitting: Cardiology

## 2014-08-29 ENCOUNTER — Ambulatory Visit (INDEPENDENT_AMBULATORY_CARE_PROVIDER_SITE_OTHER): Payer: Medicare Other

## 2014-08-29 DIAGNOSIS — Z5181 Encounter for therapeutic drug level monitoring: Secondary | ICD-10-CM

## 2014-08-29 DIAGNOSIS — I4891 Unspecified atrial fibrillation: Secondary | ICD-10-CM

## 2014-08-29 DIAGNOSIS — Z7901 Long term (current) use of anticoagulants: Secondary | ICD-10-CM

## 2014-08-29 LAB — POCT INR: INR: 1.8

## 2014-09-20 ENCOUNTER — Ambulatory Visit (INDEPENDENT_AMBULATORY_CARE_PROVIDER_SITE_OTHER): Payer: Medicare Other | Admitting: *Deleted

## 2014-09-20 DIAGNOSIS — Z5181 Encounter for therapeutic drug level monitoring: Secondary | ICD-10-CM

## 2014-09-20 DIAGNOSIS — I4891 Unspecified atrial fibrillation: Secondary | ICD-10-CM

## 2014-09-20 DIAGNOSIS — Z7901 Long term (current) use of anticoagulants: Secondary | ICD-10-CM | POA: Diagnosis not present

## 2014-09-20 LAB — POCT INR: INR: 1.7

## 2014-10-02 ENCOUNTER — Other Ambulatory Visit: Payer: Self-pay | Admitting: Cardiology

## 2014-10-03 ENCOUNTER — Other Ambulatory Visit: Payer: Self-pay | Admitting: *Deleted

## 2014-10-03 ENCOUNTER — Ambulatory Visit (INDEPENDENT_AMBULATORY_CARE_PROVIDER_SITE_OTHER): Payer: Medicare Other | Admitting: *Deleted

## 2014-10-03 DIAGNOSIS — I4891 Unspecified atrial fibrillation: Secondary | ICD-10-CM | POA: Diagnosis not present

## 2014-10-03 DIAGNOSIS — Z5181 Encounter for therapeutic drug level monitoring: Secondary | ICD-10-CM

## 2014-10-03 DIAGNOSIS — Z7901 Long term (current) use of anticoagulants: Secondary | ICD-10-CM | POA: Diagnosis not present

## 2014-10-03 LAB — POCT INR: INR: 1.1

## 2014-10-03 MED ORDER — FUROSEMIDE 40 MG PO TABS: 40.0000 mg | ORAL_TABLET | ORAL | Status: DC

## 2014-10-03 MED ORDER — FUROSEMIDE 80 MG PO TABS: 80.0000 mg | ORAL_TABLET | ORAL | Status: DC

## 2014-10-03 NOTE — Telephone Encounter (Signed)
Should the sig on both of these be take one tablet qod? Please advise. Thanks, MI

## 2014-10-18 ENCOUNTER — Ambulatory Visit (INDEPENDENT_AMBULATORY_CARE_PROVIDER_SITE_OTHER): Payer: Medicare Other | Admitting: *Deleted

## 2014-10-18 DIAGNOSIS — Z5181 Encounter for therapeutic drug level monitoring: Secondary | ICD-10-CM

## 2014-10-18 DIAGNOSIS — I4891 Unspecified atrial fibrillation: Secondary | ICD-10-CM | POA: Diagnosis not present

## 2014-10-18 DIAGNOSIS — Z7901 Long term (current) use of anticoagulants: Secondary | ICD-10-CM | POA: Diagnosis not present

## 2014-10-18 LAB — POCT INR: INR: 1.6

## 2014-11-04 ENCOUNTER — Ambulatory Visit (INDEPENDENT_AMBULATORY_CARE_PROVIDER_SITE_OTHER): Payer: Medicare Other | Admitting: *Deleted

## 2014-11-04 DIAGNOSIS — I4891 Unspecified atrial fibrillation: Secondary | ICD-10-CM | POA: Diagnosis not present

## 2014-11-04 DIAGNOSIS — Z5181 Encounter for therapeutic drug level monitoring: Secondary | ICD-10-CM | POA: Diagnosis not present

## 2014-11-04 DIAGNOSIS — Z7901 Long term (current) use of anticoagulants: Secondary | ICD-10-CM | POA: Diagnosis not present

## 2014-11-04 LAB — POCT INR: INR: 1.5

## 2014-11-14 ENCOUNTER — Other Ambulatory Visit: Payer: Self-pay | Admitting: Cardiology

## 2014-11-18 ENCOUNTER — Ambulatory Visit (INDEPENDENT_AMBULATORY_CARE_PROVIDER_SITE_OTHER): Payer: Medicare Other | Admitting: *Deleted

## 2014-11-18 DIAGNOSIS — Z5181 Encounter for therapeutic drug level monitoring: Secondary | ICD-10-CM

## 2014-11-18 DIAGNOSIS — Z7901 Long term (current) use of anticoagulants: Secondary | ICD-10-CM

## 2014-11-18 DIAGNOSIS — I4891 Unspecified atrial fibrillation: Secondary | ICD-10-CM

## 2014-11-18 LAB — POCT INR: INR: 3.6

## 2014-12-04 ENCOUNTER — Ambulatory Visit (INDEPENDENT_AMBULATORY_CARE_PROVIDER_SITE_OTHER): Payer: Medicare Other | Admitting: *Deleted

## 2014-12-04 DIAGNOSIS — Z7901 Long term (current) use of anticoagulants: Secondary | ICD-10-CM | POA: Diagnosis not present

## 2014-12-04 DIAGNOSIS — I4891 Unspecified atrial fibrillation: Secondary | ICD-10-CM

## 2014-12-04 DIAGNOSIS — Z5181 Encounter for therapeutic drug level monitoring: Secondary | ICD-10-CM | POA: Diagnosis not present

## 2014-12-04 LAB — POCT INR: INR: 1.3

## 2014-12-13 ENCOUNTER — Ambulatory Visit (INDEPENDENT_AMBULATORY_CARE_PROVIDER_SITE_OTHER): Payer: Medicare Other | Admitting: *Deleted

## 2014-12-13 DIAGNOSIS — Z5181 Encounter for therapeutic drug level monitoring: Secondary | ICD-10-CM

## 2014-12-13 DIAGNOSIS — I4891 Unspecified atrial fibrillation: Secondary | ICD-10-CM | POA: Diagnosis not present

## 2014-12-13 DIAGNOSIS — Z7901 Long term (current) use of anticoagulants: Secondary | ICD-10-CM

## 2014-12-13 LAB — POCT INR: INR: 1.3

## 2014-12-14 ENCOUNTER — Other Ambulatory Visit: Payer: Self-pay | Admitting: Cardiology

## 2014-12-16 NOTE — Telephone Encounter (Signed)
I do not see recent lipid panel. Please advise on refill. Thanks, MI

## 2014-12-18 ENCOUNTER — Encounter: Payer: Self-pay | Admitting: Cardiology

## 2014-12-20 ENCOUNTER — Other Ambulatory Visit: Payer: Self-pay | Admitting: Cardiology

## 2014-12-20 LAB — PROTIME-INR: INR: 2.1 — AB (ref 0.9–1.1)

## 2014-12-21 LAB — PROTIME-INR
INR: 2.1 — AB (ref 0.8–1.2)
PROTHROMBIN TIME: 21.2 s — AB (ref 9.1–12.0)

## 2014-12-23 ENCOUNTER — Ambulatory Visit (INDEPENDENT_AMBULATORY_CARE_PROVIDER_SITE_OTHER): Payer: Medicare Other | Admitting: Pharmacist

## 2014-12-23 DIAGNOSIS — I4891 Unspecified atrial fibrillation: Secondary | ICD-10-CM

## 2014-12-23 DIAGNOSIS — Z5181 Encounter for therapeutic drug level monitoring: Secondary | ICD-10-CM

## 2015-01-06 ENCOUNTER — Ambulatory Visit (INDEPENDENT_AMBULATORY_CARE_PROVIDER_SITE_OTHER): Payer: Medicare Other | Admitting: *Deleted

## 2015-01-06 DIAGNOSIS — Z7901 Long term (current) use of anticoagulants: Secondary | ICD-10-CM | POA: Diagnosis not present

## 2015-01-06 DIAGNOSIS — I4891 Unspecified atrial fibrillation: Secondary | ICD-10-CM | POA: Diagnosis not present

## 2015-01-06 DIAGNOSIS — Z5181 Encounter for therapeutic drug level monitoring: Secondary | ICD-10-CM | POA: Diagnosis not present

## 2015-01-06 LAB — POCT INR: INR: 1.2

## 2015-01-20 ENCOUNTER — Ambulatory Visit (INDEPENDENT_AMBULATORY_CARE_PROVIDER_SITE_OTHER): Payer: Medicare Other | Admitting: *Deleted

## 2015-01-20 DIAGNOSIS — Z5181 Encounter for therapeutic drug level monitoring: Secondary | ICD-10-CM | POA: Diagnosis not present

## 2015-01-20 DIAGNOSIS — I4891 Unspecified atrial fibrillation: Secondary | ICD-10-CM

## 2015-01-20 DIAGNOSIS — Z7901 Long term (current) use of anticoagulants: Secondary | ICD-10-CM | POA: Diagnosis not present

## 2015-01-20 LAB — POCT INR: INR: 3

## 2015-02-10 ENCOUNTER — Ambulatory Visit (INDEPENDENT_AMBULATORY_CARE_PROVIDER_SITE_OTHER): Payer: Medicare Other | Admitting: *Deleted

## 2015-02-10 DIAGNOSIS — Z5181 Encounter for therapeutic drug level monitoring: Secondary | ICD-10-CM

## 2015-02-10 DIAGNOSIS — Z7901 Long term (current) use of anticoagulants: Secondary | ICD-10-CM | POA: Diagnosis not present

## 2015-02-10 DIAGNOSIS — I4891 Unspecified atrial fibrillation: Secondary | ICD-10-CM | POA: Diagnosis not present

## 2015-02-10 LAB — POCT INR: INR: 1.2

## 2015-02-17 ENCOUNTER — Ambulatory Visit (INDEPENDENT_AMBULATORY_CARE_PROVIDER_SITE_OTHER): Payer: Medicare Other | Admitting: *Deleted

## 2015-02-17 DIAGNOSIS — Z7901 Long term (current) use of anticoagulants: Secondary | ICD-10-CM

## 2015-02-17 DIAGNOSIS — I4891 Unspecified atrial fibrillation: Secondary | ICD-10-CM

## 2015-02-17 DIAGNOSIS — Z5181 Encounter for therapeutic drug level monitoring: Secondary | ICD-10-CM

## 2015-02-17 LAB — POCT INR: INR: 1.7

## 2015-03-04 ENCOUNTER — Ambulatory Visit (INDEPENDENT_AMBULATORY_CARE_PROVIDER_SITE_OTHER): Payer: Medicare Other | Admitting: Pharmacist

## 2015-03-04 DIAGNOSIS — Z7901 Long term (current) use of anticoagulants: Secondary | ICD-10-CM

## 2015-03-04 DIAGNOSIS — I4891 Unspecified atrial fibrillation: Secondary | ICD-10-CM | POA: Diagnosis not present

## 2015-03-04 DIAGNOSIS — Z5181 Encounter for therapeutic drug level monitoring: Secondary | ICD-10-CM | POA: Diagnosis not present

## 2015-03-04 LAB — POCT INR: INR: 1.1

## 2015-03-05 ENCOUNTER — Telehealth: Payer: Self-pay | Admitting: Cardiology

## 2015-03-05 NOTE — Telephone Encounter (Signed)
New message      Pt c/o Syncope: STAT if syncope occurred within 30 minutes and pt complains of lightheadedness High Priority if episode of passing out, completely, today or in last 24 hours   1. Did you pass out today? no  When is the last time you passed out?  Last week 2. Has this occurred multiple times? no  3. Did you have any symptoms prior to passing out? no

## 2015-03-05 NOTE — Telephone Encounter (Signed)
Spoke with patient and she did pass out last week Patient is not sure if it was before Thanksgiving or after She stated she did not hit her head She did not have any warning signs of feeling bad prior to episode and stated she has been fine since then Patient does not know if she got up from a sitting position and was walking into another room, fell in a doorway She is uncertain of the time of day but thinks she had 2 meals prior to falling Patient does have memory issues, this is not new  Patient no longer drives and husband is unable to drive at this time secondary to recent fall  Dependent on son who lives out of town to drive her to appointment.  She wanted an appointment with  Dr. Mare Ferrari when he is back in the office the week of 12/12 but advised she needed to be seen prior since she is on Warfarin Son will not be able to bring her to appointment until next week Will call her tomorrow with appointment date and time

## 2015-03-07 NOTE — Telephone Encounter (Signed)
Appointment scheduled for next week with Tera Helper NP  Patient aware

## 2015-03-12 ENCOUNTER — Ambulatory Visit (INDEPENDENT_AMBULATORY_CARE_PROVIDER_SITE_OTHER): Payer: Medicare Other | Admitting: Pharmacist

## 2015-03-12 ENCOUNTER — Ambulatory Visit
Admission: RE | Admit: 2015-03-12 | Discharge: 2015-03-12 | Disposition: A | Discharge status: Home / Self Care | Payer: Medicare Other | Source: Ambulatory Visit | Attending: Nurse Practitioner | Admitting: Nurse Practitioner

## 2015-03-12 ENCOUNTER — Encounter: Payer: Self-pay | Admitting: Nurse Practitioner

## 2015-03-12 ENCOUNTER — Ambulatory Visit (INDEPENDENT_AMBULATORY_CARE_PROVIDER_SITE_OTHER): Payer: Medicare Other | Admitting: Nurse Practitioner

## 2015-03-12 VITALS — BP 134/80 | HR 88 | Ht 64.0 in | Wt 147.0 lb

## 2015-03-12 DIAGNOSIS — R55 Syncope and collapse: Secondary | ICD-10-CM

## 2015-03-12 DIAGNOSIS — Z5181 Encounter for therapeutic drug level monitoring: Secondary | ICD-10-CM

## 2015-03-12 DIAGNOSIS — I4891 Unspecified atrial fibrillation: Secondary | ICD-10-CM

## 2015-03-12 LAB — CBC
HCT: 41.2 % (ref 36.0–46.0)
Hemoglobin: 13.7 g/dL (ref 12.0–15.0)
MCH: 28.9 pg (ref 26.0–34.0)
MCHC: 33.3 g/dL (ref 30.0–36.0)
MCV: 86.9 fL (ref 78.0–100.0)
MPV: 9.9 fL (ref 8.6–12.4)
Platelets: 222 10*3/uL (ref 150–400)
RBC: 4.74 MIL/uL (ref 3.87–5.11)
RDW: 15.4 % (ref 11.5–15.5)
WBC: 5.6 10*3/uL (ref 4.0–10.5)

## 2015-03-12 LAB — POCT INR: INR: 1.1

## 2015-03-12 LAB — BASIC METABOLIC PANEL
BUN: 13 mg/dL (ref 7–25)
CO2: 29 mmol/L (ref 20–31)
Calcium: 9.5 mg/dL (ref 8.6–10.4)
Chloride: 102 mmol/L (ref 98–110)
Creat: 0.75 mg/dL (ref 0.60–0.93)
Glucose, Bld: 118 mg/dL — ABNORMAL HIGH (ref 65–99)
Potassium: 3.5 mmol/L (ref 3.5–5.3)
Sodium: 140 mmol/L (ref 135–146)

## 2015-03-12 LAB — TSH: TSH: 3.364 u[IU]/mL (ref 0.350–4.500)

## 2015-03-12 MED ORDER — RIVAROXABAN 20 MG PO TABS: 20.0000 mg | ORAL_TABLET | Freq: Every day | ORAL | Status: DC

## 2015-03-12 NOTE — Patient Instructions (Addendum)
We will be checking the following labs today - BMET, CBC, TSH  Coumadin check today.    Medication Instructions:    Continue with your current medicines.   Please check this list of medicines you are given today with your bottles at home.     Testing/Procedures To Be Arranged:  CT scan of the head - no contrast - DX - syncope  Follow-Up:   See Dr. Mare Ferrari in one month.     Other Special Instructions:   N/A    If you need a refill on your cardiac medications before your next appointment, please call your pharmacy.   Call the [place] office at [phone] if you have any questions, problems or concerns.

## 2015-03-12 NOTE — Progress Notes (Signed)
CARDIOLOGY OFFICE NOTE  Date:  03/12/2015    Karen Arroyo Date of Birth: 02-23-1934 Medical Record [id number]  PCP:  Tivis Ringer, MD  Cardiologist:  Mare Ferrari    Chief Complaint  Patient presents with  . Loss of Consciousness    Work in visit for syncope - seen for Dr. Mare Ferrari.   . Atrial Fibrillation  . Coronary Artery Disease    History of Present Illness: Karen Arroyo is a 81 y.o. female who presents today for a work in visit. Seen for Dr. Mare Ferrari.   She has a history of ischemic heart disease and had coronary artery bypass graft surgery in 2000. In August 2012 she had recurrent angina pectoris and underwent cardiac catheterization which did not show any new lesions. She has been managed medicaly. The patient also has a history of diastolic dysfunction, pulmonary hypertension, hypercholesterolemia, and diabetes. She has had a history of intermittent atrial fib and is on long-term Coumadin.   Was last seen in February and cardiac status seemed stable.   Phone call 2 weeks ago - "Spoke with patient and she did pass out last week Patient is not sure if it was before Thanksgiving or after. She stated she did not hit her head. She did not have any warning signs of feeling bad prior to episode and stated she has been fine since then. Patient does not know if she got up from a sitting position and was walking into another room, fell in a doorway. She is uncertain of the time of day but thinks she had 2 meals prior to falling. Patient does have memory issues, this is not new. Patient no longer drives and husband is unable to drive at this time secondary to recent fall. Dependent on son who lives out of town to drive her to appointment.She wanted an appointment with Dr. Mare Ferrari when he is back in the office the week of 12/12 but advised she needed to be seen prior since she is on Warfarin/ Son will not be able to bring her to appointment until next week Will call her  tomorrow with appointment date and time".   Thus added to my schedule.   Comes in today. Here alone. She says her vision is poor. Asking for medicine for her memory - already on. History pretty unclear. She admits that she has issues with her memory. Only able to tell me that a week and a half ago, she was in a doorway and passed out. She does not remember what she was doing prior or after. She does not remember if she had symptoms prior. She says she did not get hurt and that now she is "fine". She continuously asks about her Aricept. Her last several INRs are very subtherapeutic. She tells me that she manages her medicines - admits she probably misses some of her medicines. By her INRs I would say she is not taking.    Past Medical History  Diagnosis Date  . HX: long term anticoagulant use   . PAF (paroxysmal atrial fibrillation) ([place])   . Coronary artery disease   . Diabetes mellitus   . Dyslipidemia   . Pulmonary hypertension ([place])   . Diastolic dysfunction   . Skin cancer     nose, leg  . Edema   . MI, old 76    involving small LCX  . S/P CABG (coronary artery bypass graft) 2000    Past Surgical History  Procedure Laterality Date  .  Coronary artery bypass graft  2000    LIMA to LAD; SVG to intermediate, SVG to PD and RCA per Dr. Servando Snare  . Tubal ligation  1961  . Other surgical history  1999    historectomy  . Cardiac catheterization  1995    dr Oran Rein     Medications: Current Outpatient Prescriptions  Medication Sig Dispense Refill  . ALPRAZolam (XANAX) 0.25 MG tablet TAKE 1 TABLET BY MOUTH EVERY 6 HOURS AS NEEDED (Patient taking differently: TAKE 1 TABLET BY MOUTH EVERY 6 HOURS AS NEEDED FOR ANXIETY) 30 tablet 5  . ammonium lactate (AMLACTIN) 12 % cream Apply 1 g topically as directed.     Marland Kitchen atorvastatin (LIPITOR) 10 MG tablet TAKE 1 TABLET BY MOUTH EVERY DAY 30 tablet 11  . benazepril (LOTENSIN) 20 MG tablet TAKE 1 TABLET BY MOUTH DAILY 60 tablet 1  .  Calcium-Vitamin D (CALTRATE 600 PLUS-VIT D PO) Take 1 tablet by mouth 2 (two) times daily.      . citalopram (CELEXA) 20 MG tablet Take 1 tablet by mouth Daily.    Marland Kitchen donepezil (ARICEPT ODT) 5 MG disintegrating tablet   0  . fexofenadine (ALLEGRA) 60 MG tablet Take 60 mg by mouth daily as needed for allergies or rhinitis.     . furosemide (LASIX) 40 MG tablet Take 1 tablet (40 mg total) by mouth every other day. Alternates with 80 mg by mouth daily 30 tablet 5  . furosemide (LASIX) 80 MG tablet Take 1 tablet (80 mg total) by mouth every other day. ALTERNATING WITH 40 MG TABLET 30 tablet 5  . glimepiride (AMARYL) 2 MG tablet Take 1 mg by mouth daily before breakfast.      . metFORMIN (GLUCOPHAGE-XR) 500 MG 24 hr tablet Take 500 mg by mouth 2 (two) times daily.      . metoprolol tartrate (LOPRESSOR) 25 MG tablet 1/2 TABLET TWICE A DAY (Patient taking differently: TAKE 1/2 TABLET BY MOUTH TWICE A DAY) 30 tablet 1  . Multiple Vitamins-Minerals (MULTIVITAL PO) Take 1 capsule by mouth daily.     . nitroGLYCERIN (NITROSTAT) 0.4 MG SL tablet Place 1 tablet (0.4 mg total) under the tongue every 5 (five) minutes as needed. (Patient taking differently: Place 0.4 mg under the tongue every 5 (five) minutes as needed for chest pain (MAX 3 TABLETS). ) 25 tablet 6  . omeprazole (PRILOSEC) 20 MG capsule TAKE ONE CAPSULE BY MOUTH TWICE A DAY 60 capsule 1  . pioglitazone (ACTOS) 45 MG tablet TAKE 1/2 TABLET BY MOUTH EVERY DAY 30 tablet 3  . traZODone (DESYREL) 50 MG tablet Take 50 mg by mouth at bedtime as needed for sleep.    Marland Kitchen warfarin (COUMADIN) 5 MG tablet TAKE 1.5-2 TABLETS (7.5-10 MG TOTAL) BY MOUTH DAILY. 60 tablet 3   No current facility-administered medications for this visit.    Allergies: Allergies  Allergen Reactions  . Aspirin     Rash - only in high doses  . Dimetapp Cold-Allergy     Rash   . Ilosone [Erythromycin Estolate]     rash  . Morphine And Related     rash  . Penicillins     rash    . Restoril     rash  . Zolpidem Tartrate     rash    Social History: The patient  reports that she has never smoked. She does not have any smokeless tobacco history on file. She reports that she does not drink alcohol or  use illicit drugs.   Family History: The patient's family history includes Heart attack in her mother; Transient ischemic attack in her father.   Review of Systems: Please see the history of present illness.   Otherwise, the review of systems is positive for none.   All other systems are reviewed and negative.   Physical Exam: VS:  BP 134/80 mmHg  Pulse 88  Ht 5\' 4"  (1.626 m)  Wt 147 lb (66.679 kg)  BMI 25.22 kg/m2 .  BMI Body mass index is 25.22 kg/(m^2).  Wt Readings from Last 3 Encounters:  03/12/15 147 lb (66.679 kg)  05/08/14 161 lb (73.029 kg)  04/15/14 162 lb 12.8 oz (73.846 kg)   Her weight is down 14 pounds from last visit here.   General: Pleasant. Elderly female who is in no acute distress.  HEENT: Normal. Neck: Supple, no JVD, carotid bruits, or masses noted.  Cardiac: Irregular irregular rhythm. Rate is ok. No edema.  Respiratory:  Lungs are clear to auscultation bilaterally with normal work of breathing.  GI: Soft and nontender.  MS: No deformity or atrophy. Gait and ROM intact. Skin: Warm and dry. Color is normal.  Neuro:  Strength and sensation are intact and no gross focal deficits noted.  Psych: Alert, appropriate and with normal affect.   LABORATORY DATA:  EKG:  EKG is ordered today. This demonstrates atrial fib with controlled VR. She has diffuse ST depression.  Lab Results  Component Value Date   WBC 4.6 11/25/2010   HGB 10.6* 11/25/2010   HCT 32.3* 11/25/2010   PLT 139.0* 11/25/2010   GLUCOSE 101* 10/03/2012   CHOL 208* 10/03/2012   TRIG 83.0 10/03/2012   HDL 67.00 10/03/2012   LDLDIRECT 124.9 10/03/2012   ALT 11 10/03/2012   AST 17 10/03/2012   NA 138 10/03/2012   K 3.7 10/03/2012   CL 102 10/03/2012   CREATININE  0.9 10/03/2012   BUN 22 10/03/2012   CO2 29 10/03/2012   TSH 1.92 10/19/2010   INR 1.1 03/04/2015    Lab Results  Component Value Date   INR 1.1 03/04/2015   INR 1.7 02/17/2015   INR 1.2 02/10/2015     BNP (last 3 results) No results for input(s): BNP in the last 8760 hours.  ProBNP (last 3 results) No results for input(s): PROBNP in the last 8760 hours.   Other Studies Reviewed Today:   Assessment/Plan: 1. Syncope - very poor historian. She may have had a stroke. Looks like she is not taking her coumadin. She does not wish for me to talk with her children. They are NOT listed on her DPR. Will check CT of the head. Labs today. I have held off on stress testing and echo for now in light of her memory issues/probable dementia. Will get her back to see Dr. Mare Ferrari. If she has recurrent spells, then she will need further testing.  2. Noncompliance - most likely not taking her medicines. Need to get the family involved but she does not wish for me to speak with them. They are not listed on her DPR.   3. Chronic atrial fib - suppose to be on coumadin - looks like she is not taking.   Very difficult situation. I think her situation is tenuous at best. She says she does have primary care - I have informed her of Dr. Sherryl Barters retirement.   Current medicines are reviewed with the patient today.  The patient does not have concerns regarding medicines other  than what has been noted above.  The following changes have been made:  See above.  Labs/ tests ordered today include:    Orders Placed This Encounter  Procedures  . Basic metabolic panel  . CBC  . TSH  . EKG 12-Lead     Disposition:   FU with   Patient is agreeable to this plan and will call if any problems develop in the interim.   Signed: Burtis Junes, RN, ANP-C 03/12/2015 11:55 AM  [facility] [address] [place], [place]  [address] Phone: [phone] Fax:  [phone]

## 2015-03-12 NOTE — Addendum Note (Signed)
Addended by: Juanda Crumble R on: 03/12/2015 12:33 PM   Modules accepted: Orders, Medications

## 2015-03-13 ENCOUNTER — Telehealth: Payer: Self-pay | Admitting: Nurse Practitioner

## 2015-03-13 NOTE — Telephone Encounter (Signed)
New message     Xarelto has not been prior approved by ins company.  Pt went to [facility] at [address] to get presc.  Please call when it is ready to be picked up.  Ok to call son or patient's phone.

## 2015-03-20 ENCOUNTER — Ambulatory Visit (INDEPENDENT_AMBULATORY_CARE_PROVIDER_SITE_OTHER): Payer: Medicare Other | Admitting: Ophthalmology

## 2015-03-20 ENCOUNTER — Telehealth: Payer: Self-pay

## 2015-03-20 NOTE — Telephone Encounter (Signed)
Prior auth for Xarelto 20mg sent to [facility]. 

## 2015-03-20 NOTE — Telephone Encounter (Signed)
Prior auth for Xarelto 20 mg sent today.

## 2015-03-21 ENCOUNTER — Telehealth: Payer: Self-pay

## 2015-03-21 NOTE — Telephone Encounter (Signed)
Xarelto approved through 03/19/2016. [facility] [id number].

## 2015-03-26 ENCOUNTER — Other Ambulatory Visit: Payer: Self-pay | Admitting: Cardiology

## 2015-04-09 ENCOUNTER — Ambulatory Visit (INDEPENDENT_AMBULATORY_CARE_PROVIDER_SITE_OTHER): Payer: Medicare Other | Admitting: Ophthalmology

## 2015-04-23 ENCOUNTER — Ambulatory Visit (INDEPENDENT_AMBULATORY_CARE_PROVIDER_SITE_OTHER): Payer: Medicare Other | Admitting: Cardiology

## 2015-04-23 ENCOUNTER — Encounter: Payer: Self-pay | Admitting: Cardiology

## 2015-04-23 VITALS — BP 116/70 | HR 92 | Ht 64.0 in | Wt 139.8 lb

## 2015-04-23 DIAGNOSIS — I5189 Other ill-defined heart diseases: Secondary | ICD-10-CM

## 2015-04-23 DIAGNOSIS — I4891 Unspecified atrial fibrillation: Secondary | ICD-10-CM

## 2015-04-23 DIAGNOSIS — I519 Heart disease, unspecified: Secondary | ICD-10-CM

## 2015-04-23 DIAGNOSIS — I259 Chronic ischemic heart disease, unspecified: Secondary | ICD-10-CM | POA: Diagnosis not present

## 2015-04-23 NOTE — Progress Notes (Signed)
Cardiology Office Note   Date:  04/23/2015   ID:  Karen Arroyo, DOB [birth year], MRN [id number]  PCP:  Tivis Ringer, MD  Cardiologist: Darlin Coco MD  Chief Complaint  Patient presents with  . Follow-up    Patient c/o chest pain, shortness of breath, and le edema. She denies any claudication.      History of Present Illness: Karen Arroyo is a 82 y.o. female who presents for a six-month follow-up office visit  This pleasant 82 year old woman is seen for a followup office visit. She has a history of ischemic heart disease and had coronary artery bypass graft surgery in 2000. In August 2012 she had recurrent angina pectoris and underwent cardiac catheterization which did not show any new lesions. Since then she has had very little in the way of chest discomfort. The patient also has a history of diastolic dysfunction, pulmonary hypertension, hypercholesterolemia, and diabetes. She has had a history of intermittent atrial fib and is on long-term Coumadin. Since last visit she's had no new cardiac symptoms. She was having more symptoms of arthritis with painful right knee with motion but does not intend to have any evaluation or surgery at this time. She has 2 young grandchildren down in [place]. She does a lot of babysitting for them.  Since last visit she has not had any recurrent problem with bronchitis. She has had further significant weight loss since last visit.  Since February 2016 her weight is down 22 pounds.  Since December 2016 her weight is down 8 pounds.  Eyes any change in bowel habits hematochezia or melena or other localizing symptoms.  Past Medical History  Diagnosis Date  . HX: long term anticoagulant use   . PAF (paroxysmal atrial fibrillation) ([place])   . Coronary artery disease   . Diabetes mellitus   . Dyslipidemia   . Pulmonary hypertension ([place])   . Diastolic dysfunction   . Skin cancer     nose, leg  . Edema   . MI, old 67   involving small LCX  . S/P CABG (coronary artery bypass graft) 2000    Past Surgical History  Procedure Laterality Date  . Coronary artery bypass graft  2000    LIMA to LAD; SVG to intermediate, SVG to PD and RCA per Dr. Servando Snare  . Tubal ligation  1961  . Other surgical history  1999    historectomy  . Cardiac catheterization  1995    dr Oran Rein     Current Outpatient Prescriptions  Medication Sig Dispense Refill  . ALPRAZolam (XANAX) 0.25 MG tablet Take 0.25 mg by mouth every 6 (six) hours as needed for anxiety.    Marland Kitchen ammonium lactate (AMLACTIN) 12 % cream Apply 1 g topically as directed.     Marland Kitchen atorvastatin (LIPITOR) 10 MG tablet TAKE 1 TABLET BY MOUTH EVERY DAY 30 tablet 11  . benazepril (LOTENSIN) 20 MG tablet TAKE 1 TABLET BY MOUTH DAILY 60 tablet 1  . benazepril (LOTENSIN) 20 MG tablet TAKE 1 TABLET BY MOUTH DAILY 90 tablet 3  . Calcium-Vitamin D (CALTRATE 600 PLUS-VIT D PO) Take 1 tablet by mouth 2 (two) times daily.      . citalopram (CELEXA) 20 MG tablet Take 1 tablet by mouth Daily.    Marland Kitchen donepezil (ARICEPT) 10 MG tablet Take 10 mg by mouth daily.    . fexofenadine (ALLEGRA) 60 MG tablet Take 60 mg by mouth daily as needed for allergies or rhinitis.     Marland Kitchen  furosemide (LASIX) 40 MG tablet Take 1 tablet (40 mg total) by mouth every other day. Alternates with 80 mg by mouth daily 30 tablet 5  . furosemide (LASIX) 80 MG tablet Take 1 tablet (80 mg total) by mouth every other day. ALTERNATING WITH 40 MG TABLET 30 tablet 5  . glimepiride (AMARYL) 2 MG tablet Take 1 mg by mouth daily before breakfast.      . metFORMIN (GLUCOPHAGE-XR) 500 MG 24 hr tablet Take 500 mg by mouth 2 (two) times daily.      . metoprolol tartrate (LOPRESSOR) 25 MG tablet Take 12.5 mg by mouth 2 (two) times daily.    . Multiple Vitamins-Minerals (MULTIVITAL PO) Take 1 capsule by mouth daily.     . nitroGLYCERIN (NITROSTAT) 0.4 MG SL tablet Place 0.4 mg under the tongue every 5 (five) minutes as needed for chest  pain ((MAX 3 doses)).    Marland Kitchen omeprazole (PRILOSEC) 20 MG capsule TAKE ONE CAPSULE BY MOUTH TWICE A DAY 60 capsule 1  . pioglitazone (ACTOS) 45 MG tablet TAKE 1/2 TABLET BY MOUTH EVERY DAY 30 tablet 3  . rivaroxaban (XARELTO) 20 MG TABS tablet Take 1 tablet (20 mg total) by mouth daily with supper. 30 tablet 6  . traZODone (DESYREL) 50 MG tablet Take 50 mg by mouth at bedtime as needed for sleep.     No current facility-administered medications for this visit.    Allergies:   Aspirin; Dimetapp cold-allergy; Ilosone; Morphine and related; Penicillins; Restoril; and Zolpidem tartrate    Social History:  The patient  reports that she has never smoked. She does not have any smokeless tobacco history on file. She reports that she does not drink alcohol or use illicit drugs.   Family History:  The patient's family history includes Heart attack in her mother; Transient ischemic attack in her father.    ROS:  Please see the history of present illness.   Otherwise, review of systems are positive for none.   All other systems are reviewed and negative.    PHYSICAL EXAM: VS:  BP 116/70 mmHg  Pulse 92  Ht 5\' 4"  (1.626 m)  Wt 139 lb 12.8 oz (63.413 kg)  BMI 23.98 kg/m2 , BMI Body mass index is 23.98 kg/(m^2). GEN: Well nourished, well developed, in no acute distress HEENT: normal Neck: no JVD, carotid bruits, or masses Cardiac: Irregularly irregular.  Soft apical systolic murmur grade 1/6.  No gallop.  Trace edema. Respiratory:  clear to auscultation bilaterally, normal work of breathing GI: soft, nontender, nondistended, + BS MS: no deformity or atrophy Skin: warm and dry, no rash Neuro:  Strength and sensation are intact Psych: euthymic mood, full affect   EKG:  EKG is ordered today. The ekg ordered today demonstrates atrial fibrillation with controlled ventricular response at 92 bpm.  Nonspecific ST-T wave changes.  Since prior tracing of 03/12/15, no significant change   Recent  Labs: 03/12/2015: BUN 13; Creat 0.75; Hemoglobin 13.7; Platelets 222; Potassium 3.5; Sodium 140; TSH 3.364    Lipid Panel    Component Value Date/Time   CHOL 208* 10/03/2012 0908   TRIG 83.0 10/03/2012 0908   HDL 67.00 10/03/2012 0908   CHOLHDL 3 10/03/2012 0908   VLDL 16.6 10/03/2012 0908   LDLDIRECT 124.9 10/03/2012 0908      Wt Readings from Last 3 Encounters:  04/23/15 139 lb 12.8 oz (63.413 kg)  03/12/15 147 lb (66.679 kg)  05/08/14 161 lb (73.029 kg)  ASSESSMENT AND PLAN: 1. Paroxysmal atrial fibrillation, essentially asymptomatic. She is on long-term Coumadin. 2. Ischemic heart disease status post CABG in 2000. She has rare episodes of angina for which she takes sublingual nitroglycerin. 3. Chronic diastolic heart failure, controlled on medication 4. Adult-onset diabetes, no hypoglycemic episodes. 5. Hypercholesterolemia followed by Dr. Dagmar Hait. 6. Recent bronchitis, resolved    Current medicines are reviewed at length with the patient today.  The patient does not have concerns regarding medicines.  The following changes have been made:  no change  Labs/ tests ordered today include:   Orders Placed This Encounter  Procedures  . EKG 12-Lead    Disposition: Continue current medication.  Recheck in 6 months for office visit with Dr. Debara Pickett  Signed, Darlin Coco MD 04/23/2015 6:16 PM    [facility], [place], [place]  [address] Phone: [phone]; Fax: [phone]

## 2015-04-23 NOTE — Patient Instructions (Signed)
Medication Instructions:  Your physician recommends that you continue on your current medications as directed. Please refer to the Current Medication list given to you today.  Labwork: NONE  Testing/Procedures: NONE  Follow-Up: Your physician wants you to follow-up in: [place] will receive a reminder letter in the mail two months in advance. If you don't receive a letter, please call our office to schedule the follow-up appointment.  If you need a refill on your cardiac medications before your next appointment, please call your pharmacy.

## 2015-04-26 ENCOUNTER — Telehealth: Payer: Self-pay | Admitting: Physician Assistant

## 2015-04-26 ENCOUNTER — Encounter (HOSPITAL_COMMUNITY): Payer: Self-pay | Admitting: Emergency Medicine

## 2015-04-26 ENCOUNTER — Observation Stay (HOSPITAL_COMMUNITY)
Admission: EM | Admit: 2015-04-26 | Discharge: 2015-04-28 | Disposition: A | Discharge status: Home / Self Care | Payer: Medicare Other | Attending: Internal Medicine | Admitting: Internal Medicine

## 2015-04-26 ENCOUNTER — Observation Stay (HOSPITAL_COMMUNITY): Payer: Medicare Other

## 2015-04-26 DIAGNOSIS — Z7982 Long term (current) use of aspirin: Secondary | ICD-10-CM | POA: Diagnosis not present

## 2015-04-26 DIAGNOSIS — I10 Essential (primary) hypertension: Secondary | ICD-10-CM | POA: Diagnosis not present

## 2015-04-26 DIAGNOSIS — Z885 Allergy status to narcotic agent status: Secondary | ICD-10-CM | POA: Insufficient documentation

## 2015-04-26 DIAGNOSIS — Z7984 Long term (current) use of oral hypoglycemic drugs: Secondary | ICD-10-CM | POA: Diagnosis not present

## 2015-04-26 DIAGNOSIS — Z85828 Personal history of other malignant neoplasm of skin: Secondary | ICD-10-CM | POA: Diagnosis not present

## 2015-04-26 DIAGNOSIS — N179 Acute kidney failure, unspecified: Secondary | ICD-10-CM | POA: Diagnosis not present

## 2015-04-26 DIAGNOSIS — Z886 Allergy status to analgesic agent status: Secondary | ICD-10-CM | POA: Diagnosis not present

## 2015-04-26 DIAGNOSIS — I700 Atherosclerosis of aorta: Secondary | ICD-10-CM | POA: Insufficient documentation

## 2015-04-26 DIAGNOSIS — M8580 Other specified disorders of bone density and structure, unspecified site: Secondary | ICD-10-CM | POA: Diagnosis not present

## 2015-04-26 DIAGNOSIS — I272 Other secondary pulmonary hypertension: Secondary | ICD-10-CM | POA: Diagnosis not present

## 2015-04-26 DIAGNOSIS — Z7901 Long term (current) use of anticoagulants: Secondary | ICD-10-CM | POA: Diagnosis not present

## 2015-04-26 DIAGNOSIS — M4854XA Collapsed vertebra, not elsewhere classified, thoracic region, initial encounter for fracture: Secondary | ICD-10-CM | POA: Insufficient documentation

## 2015-04-26 DIAGNOSIS — E785 Hyperlipidemia, unspecified: Secondary | ICD-10-CM | POA: Diagnosis not present

## 2015-04-26 DIAGNOSIS — E869 Volume depletion, unspecified: Secondary | ICD-10-CM | POA: Insufficient documentation

## 2015-04-26 DIAGNOSIS — E118 Type 2 diabetes mellitus with unspecified complications: Secondary | ICD-10-CM | POA: Diagnosis not present

## 2015-04-26 DIAGNOSIS — M79606 Pain in leg, unspecified: Secondary | ICD-10-CM

## 2015-04-26 DIAGNOSIS — R55 Syncope and collapse: Principal | ICD-10-CM | POA: Diagnosis present

## 2015-04-26 DIAGNOSIS — Z951 Presence of aortocoronary bypass graft: Secondary | ICD-10-CM | POA: Insufficient documentation

## 2015-04-26 DIAGNOSIS — I480 Paroxysmal atrial fibrillation: Secondary | ICD-10-CM | POA: Insufficient documentation

## 2015-04-26 DIAGNOSIS — E1165 Type 2 diabetes mellitus with hyperglycemia: Secondary | ICD-10-CM | POA: Diagnosis not present

## 2015-04-26 DIAGNOSIS — E876 Hypokalemia: Secondary | ICD-10-CM | POA: Insufficient documentation

## 2015-04-26 DIAGNOSIS — I259 Chronic ischemic heart disease, unspecified: Secondary | ICD-10-CM

## 2015-04-26 DIAGNOSIS — R600 Localized edema: Secondary | ICD-10-CM | POA: Diagnosis not present

## 2015-04-26 DIAGNOSIS — I2510 Atherosclerotic heart disease of native coronary artery without angina pectoris: Secondary | ICD-10-CM | POA: Diagnosis present

## 2015-04-26 DIAGNOSIS — Z5181 Encounter for therapeutic drug level monitoring: Secondary | ICD-10-CM

## 2015-04-26 DIAGNOSIS — I5189 Other ill-defined heart diseases: Secondary | ICD-10-CM

## 2015-04-26 DIAGNOSIS — Z888 Allergy status to other drugs, medicaments and biological substances status: Secondary | ICD-10-CM | POA: Insufficient documentation

## 2015-04-26 DIAGNOSIS — E119 Type 2 diabetes mellitus without complications: Secondary | ICD-10-CM

## 2015-04-26 DIAGNOSIS — I4891 Unspecified atrial fibrillation: Secondary | ICD-10-CM | POA: Diagnosis not present

## 2015-04-26 DIAGNOSIS — Z880 Allergy status to penicillin: Secondary | ICD-10-CM | POA: Diagnosis not present

## 2015-04-26 LAB — CBC
HCT: 45.4 % (ref 36.0–46.0)
HEMOGLOBIN: 15.8 g/dL — AB (ref 12.0–15.0)
MCH: 30.5 pg (ref 26.0–34.0)
MCHC: 34.8 g/dL (ref 30.0–36.0)
MCV: 87.6 fL (ref 78.0–100.0)
PLATELETS: 194 10*3/uL (ref 150–400)
RBC: 5.18 MIL/uL — AB (ref 3.87–5.11)
RDW: 14.9 % (ref 11.5–15.5)
WBC: 11.3 10*3/uL — AB (ref 4.0–10.5)

## 2015-04-26 LAB — CBG MONITORING, ED
GLUCOSE-CAPILLARY: 235 mg/dL — AB (ref 65–99)
Glucose-Capillary: 215 mg/dL — ABNORMAL HIGH (ref 65–99)

## 2015-04-26 LAB — BASIC METABOLIC PANEL
ANION GAP: 16 — AB (ref 5–15)
BUN: 26 mg/dL — ABNORMAL HIGH (ref 6–20)
CALCIUM: 9.7 mg/dL (ref 8.9–10.3)
CO2: 28 mmol/L (ref 22–32)
CREATININE: 1.14 mg/dL — AB (ref 0.44–1.00)
Chloride: 93 mmol/L — ABNORMAL LOW (ref 101–111)
GFR, EST AFRICAN AMERICAN: 52 mL/min — AB (ref >60)
GFR, EST NON AFRICAN AMERICAN: 45 mL/min — AB (ref >60)
Glucose, Bld: 286 mg/dL — ABNORMAL HIGH (ref 65–99)
Potassium: 4.4 mmol/L (ref 3.5–5.1)
SODIUM: 137 mmol/L (ref 135–145)

## 2015-04-26 LAB — TROPONIN I
Troponin I: <0.03 ng/mL (ref <0.031)
Troponin I: <0.03 ng/mL (ref <0.031)

## 2015-04-26 LAB — PROTIME-INR
INR: 2.15 — ABNORMAL HIGH (ref 0.00–1.49)
Prothrombin Time: 23.8 seconds — ABNORMAL HIGH (ref 11.6–15.2)

## 2015-04-26 MED ORDER — RIVAROXABAN 15 MG PO TABS
15.0000 mg | ORAL_TABLET | Freq: Every day | ORAL | Status: DC
  Administered 2015-04-26 – 2015-04-27 (×2): 15 mg via ORAL
  Filled 2015-04-26 (×2): qty 1

## 2015-04-26 MED ORDER — NITROGLYCERIN 0.4 MG SL SUBL: 0.4000 mg | SUBLINGUAL_TABLET | SUBLINGUAL | Status: DC | PRN

## 2015-04-26 MED ORDER — SODIUM CHLORIDE 0.9 % IJ SOLN
3.0000 mL | Freq: Two times a day (BID) | INTRAMUSCULAR | Status: DC
Start: 2015-04-26 — End: 2015-04-28
  Administered 2015-04-26 – 2015-04-28 (×3): 3 mL via INTRAVENOUS

## 2015-04-26 MED ORDER — METOPROLOL TARTRATE 25 MG PO TABS
12.5000 mg | ORAL_TABLET | Freq: Two times a day (BID) | ORAL | Status: DC
  Filled 2015-04-26: qty 1

## 2015-04-26 MED ORDER — INSULIN ASPART 100 UNIT/ML ~~LOC~~ SOLN
0.0000 [IU] | Freq: Every day | SUBCUTANEOUS | Status: DC
  Administered 2015-04-27: 3 [IU] via SUBCUTANEOUS

## 2015-04-26 MED ORDER — INSULIN ASPART 100 UNIT/ML ~~LOC~~ SOLN: 0.0000 [IU] | Freq: Every day | SUBCUTANEOUS | Status: DC

## 2015-04-26 MED ORDER — BENAZEPRIL HCL 20 MG PO TABS: 20.0000 mg | ORAL_TABLET | Freq: Every day | ORAL | Status: DC

## 2015-04-26 MED ORDER — SENNOSIDES-DOCUSATE SODIUM 8.6-50 MG PO TABS: 1.0000 | ORAL_TABLET | Freq: Every evening | ORAL | Status: DC | PRN

## 2015-04-26 MED ORDER — ONDANSETRON HCL 4 MG/2ML IJ SOLN: 4.0000 mg | Freq: Four times a day (QID) | INTRAMUSCULAR | Status: DC | PRN

## 2015-04-26 MED ORDER — ACETAMINOPHEN 325 MG PO TABS
650.0000 mg | ORAL_TABLET | Freq: Four times a day (QID) | ORAL | Status: DC | PRN
  Administered 2015-04-26 – 2015-04-27 (×2): 650 mg via ORAL
  Filled 2015-04-26 (×2): qty 2

## 2015-04-26 MED ORDER — AMMONIUM LACTATE 12 % EX CREA: 1.0000 g | TOPICAL_CREAM | CUTANEOUS | Status: DC

## 2015-04-26 MED ORDER — ACETAMINOPHEN 650 MG RE SUPP: 650.0000 mg | Freq: Four times a day (QID) | RECTAL | Status: DC | PRN

## 2015-04-26 MED ORDER — ATORVASTATIN CALCIUM 10 MG PO TABS
10.0000 mg | ORAL_TABLET | Freq: Every day | ORAL | Status: DC
  Administered 2015-04-26 – 2015-04-28 (×3): 10 mg via ORAL
  Filled 2015-04-26 (×3): qty 1

## 2015-04-26 MED ORDER — ASPIRIN EC 81 MG PO TBEC: 81.0000 mg | DELAYED_RELEASE_TABLET | Freq: Every day | ORAL | Status: DC

## 2015-04-26 MED ORDER — AMMONIUM LACTATE 12 % EX LOTN
TOPICAL_LOTION | CUTANEOUS | Status: DC | PRN
  Filled 2015-04-26: qty 400

## 2015-04-26 MED ORDER — INSULIN ASPART 100 UNIT/ML ~~LOC~~ SOLN
0.0000 [IU] | Freq: Three times a day (TID) | SUBCUTANEOUS | Status: DC
  Administered 2015-04-26: 7 [IU] via SUBCUTANEOUS
  Filled 2015-04-26: qty 1

## 2015-04-26 MED ORDER — INSULIN ASPART 100 UNIT/ML ~~LOC~~ SOLN
0.0000 [IU] | Freq: Three times a day (TID) | SUBCUTANEOUS | Status: DC
  Administered 2015-04-27 – 2015-04-28 (×4): 3 [IU] via SUBCUTANEOUS

## 2015-04-26 MED ORDER — ONDANSETRON HCL 4 MG PO TABS: 4.0000 mg | ORAL_TABLET | Freq: Four times a day (QID) | ORAL | Status: DC | PRN

## 2015-04-26 MED ORDER — SODIUM CHLORIDE 0.9 % IV BOLUS (SEPSIS)
500.0000 mL | Freq: Once | INTRAVENOUS | Status: AC
  Administered 2015-04-26: 500 mL via INTRAVENOUS

## 2015-04-26 MED ORDER — HYDRALAZINE HCL 20 MG/ML IJ SOLN: 5.0000 mg | INTRAMUSCULAR | Status: DC | PRN

## 2015-04-26 NOTE — H&P (Signed)
[facility] History and Physical  Karen Arroyo [id number] DOB: 1934/03/04 DOA: 04/26/2015  Referring physician: Winfred Leeds PCP: Tivis Ringer, MD   Chief Complaint: syncope  HPI: TERREN LOE is a very pleasant 82 y.o. female with a past medical history that includes A. fib on anticoagulant, diabetes, dyslipidemia, hypertension, diastolic dysfunction, CAD status post CABG in 2000 presents to the emergency Department chief complaint of syncope 3. Initial evaluation reveals mild hyperglycemia and acute kidney injury.  Information is obtained from the patient and her husband is at the bedside as patient does have some short-term memory issues. Reportedly she awakened around 3 AM ambulated to the bathroom while sitting on the commode she "I could feel myself slipping off the commode". He remembers lying on the floor with her head lodged between the edge of the commode in the wall. She called out for her husband who promptly came. He said she was alert but weak. He stood her up water over to the laboratory she "passed out again." He reports she was only "out for about 30 seconds to a minute each time. Husband reports she has had several more syncopal episodes over the last 2 days. No recent fever chills nausea vomiting diarrhea. No anorexia unintentional weight loss. Vision denies chest pain palpitations headache. She does endorse dizziness with position change.  The emergency department blood pressure is elevated is afebrile and not hypoxic. She is provided with IV fluids.  Review of Systems:  10 point review of systems complete and all systems are negative except as indicated in the history of present illness  Past Medical History  Diagnosis Date  . HX: long term anticoagulant use   . PAF (paroxysmal atrial fibrillation) ([place])   . Coronary artery disease   . Diabetes mellitus   . Dyslipidemia   . Pulmonary hypertension ([place])   . Diastolic dysfunction   . Skin cancer     nose,  leg  . Edema   . MI, old 35    involving small LCX  . S/P CABG (coronary artery bypass graft) 2000   Past Surgical History  Procedure Laterality Date  . Coronary artery bypass graft  2000    LIMA to LAD; SVG to intermediate, SVG to PD and RCA per Dr. Servando Snare  . Tubal ligation  1961  . Other surgical history  1999    historectomy  . Cardiac catheterization  1995    dr Oran Rein   Social History:  reports that she has never smoked. She does not have any smokeless tobacco history on file. She reports that she does not drink alcohol or use illicit drugs. She lives at home with her husband she has short-term memory issues but is fairly independent with ADLs she manages her diabetes Allergies  Allergen Reactions  . Aspirin     Rash - only in high doses  . Dimetapp Cold-Allergy     Rash   . Ilosone [Erythromycin Estolate]     rash  . Morphine And Related     rash  . Penicillins     rash  . Restoril     rash  . Zolpidem Tartrate     rash    Family History  Problem Relation Age of Onset  . Heart attack Mother   . Transient ischemic attack Father      Prior to Admission medications   Medication Sig Start Date End Date Taking? Authorizing Provider  ALPRAZolam Duanne Moron) 0.25 MG tablet Take 0.25 mg by mouth every  6 (six) hours as needed for anxiety.   Yes Historical Provider, MD  ammonium lactate (AMLACTIN) 12 % cream Apply 1 g topically as directed.  11/01/11  Yes Historical Provider, MD  aspirin EC 81 MG tablet Take 81 mg by mouth at bedtime.   Yes Historical Provider, MD  atorvastatin (LIPITOR) 10 MG tablet TAKE 1 TABLET BY MOUTH EVERY DAY 12/18/14  Yes Darlin Coco, MD  benazepril (LOTENSIN) 20 MG tablet TAKE 1 TABLET BY MOUTH DAILY 02/14/14  Yes Darlin Coco, MD  Calcium-Vitamin D (CALTRATE 600 PLUS-VIT D PO) Take 1 tablet by mouth 2 (two) times daily.     Yes Historical Provider, MD  citalopram (CELEXA) 20 MG tablet Take 1 tablet by mouth Daily. 10/23/10  Yes Historical  Provider, MD  donepezil (ARICEPT) 10 MG tablet Take 10 mg by mouth daily. 04/21/15  Yes Historical Provider, MD  fexofenadine (ALLEGRA) 60 MG tablet Take 60 mg by mouth daily as needed for allergies or rhinitis.    Yes Historical Provider, MD  furosemide (LASIX) 40 MG tablet Take 1 tablet (40 mg total) by mouth every other day. Alternates with 80 mg by mouth daily 10/03/14  Yes Darlin Coco, MD  furosemide (LASIX) 80 MG tablet Take 1 tablet (80 mg total) by mouth every other day. ALTERNATING WITH 40 MG TABLET 10/03/14  Yes Darlin Coco, MD  metFORMIN (GLUCOPHAGE-XR) 500 MG 24 hr tablet Take 1,000 mg by mouth 2 (two) times daily.    Yes Historical Provider, MD  metoprolol tartrate (LOPRESSOR) 25 MG tablet Take 12.5 mg by mouth 2 (two) times daily.   Yes Historical Provider, MD  Misc Natural Products (JOINT SUPPORT PO) Take 1-2 capsules by mouth daily.   Yes Historical Provider, MD  nitroGLYCERIN (NITROSTAT) 0.4 MG SL tablet Place 0.4 mg under the tongue every 5 (five) minutes as needed for chest pain ((MAX 3 doses)).   Yes Historical Provider, MD  pioglitazone (ACTOS) 45 MG tablet TAKE 1/2 TABLET BY MOUTH EVERY DAY 12/22/11  Yes Darlin Coco, MD  rivaroxaban (XARELTO) 20 MG TABS tablet Take 1 tablet (20 mg total) by mouth daily with supper. 03/12/15  Yes Burtis Junes, NP  traMADol (ULTRAM) 50 MG tablet Take 50-100 mg by mouth every 8 (eight) hours as needed.   Yes Historical Provider, MD  Multiple Vitamins-Minerals (MULTIVITAL PO) Take 1 capsule by mouth daily.     Historical Provider, MD   Physical Exam: Filed Vitals:   04/26/15 1201 04/26/15 1545  BP: 118/107 122/71  Pulse: 76 80  Temp: 97.9 F (36.6 C)   TempSrc: Oral   Resp: 18 12  SpO2: 98% 99%    Wt Readings from Last 3 Encounters:  04/23/15 63.413 kg (139 lb 12.8 oz)  03/12/15 66.679 kg (147 lb)  05/08/14 73.029 kg (161 lb)    General:  Appears calm and comfortable slowly pale Eyes: PERRL, normal lids, irises &  conjunctiva ENT: grossly normal hearing, lips & tongue Neck: no LAD, masses or thyromegaly Cardiovascular: Irregularly irregular, no m/r/g. Trace LE edema. Respiratory: CTA bilaterally somewhat distant, no w/r/r. Normal respiratory effort. Abdomen: soft, ntnd positive bowel sounds Skin: no rash or induration seen on limited exam Musculoskeletal: grossly normal tone BUE/BLE joints without erythema or swelling Psychiatric: grossly normal mood and affect, speech fluent and appropriate Neurologic: grossly non-focal. speech clear facial symmetry           Labs on Admission:  Basic Metabolic Panel:  Recent Labs Lab 04/26/15 1212  NA  137  K 4.4  CL 93*  CO2 28  GLUCOSE 286*  BUN 26*  CREATININE 1.14*  CALCIUM 9.7   Liver Function Tests: No results for input(s): AST, ALT, ALKPHOS, BILITOT, PROT, ALBUMIN in the last 168 hours. No results for input(s): LIPASE, AMYLASE in the last 168 hours. No results for input(s): AMMONIA in the last 168 hours. CBC:  Recent Labs Lab 04/26/15 1212  WBC 11.3*  HGB 15.8*  HCT 45.4  MCV 87.6  PLT 194   Cardiac Enzymes:  Recent Labs Lab 04/26/15 1607  TROPONINI <0.03    BNP (last 3 results) No results for input(s): BNP in the last 8760 hours.  ProBNP (last 3 results) No results for input(s): PROBNP in the last 8760 hours.  CBG:  Recent Labs Lab 04/26/15 1207  GLUCAP 235*    Radiological Exams on Admission: X-ray Chest Pa And Lateral  04/26/2015  CLINICAL DATA:  82 year old female with syncope today while using a restroom. Initial encounter. EXAM: CHEST  2 VIEW COMPARISON:  11/25/2010 and earlier. FINDINGS: Chronic cardiomegaly is stable to mildly increased and moderate to severe overall. Sequelae of CABG. Other mediastinal contours are within normal limits. Visualized tracheal air column is within normal limits. Chronic large lung volumes. No pneumothorax, pulmonary edema, pleural effusion or confluent pulmonary opacity.  Osteopenia. Moderate upper thoracic compression fracture is new since 2012. Other visualized osseous structures appear stable. Calcified aortic atherosclerosis. IMPRESSION: 1. Chronic cardiomegaly and hyperinflation. No acute cardiopulmonary abnormality. 2. Chronic appearing upper thoracic compression fracture is new since 2012. Electronically Signed   By: Genevie Ann M.D.   On: 04/26/2015 16:40    EKG: Independently reviewed A. Fib with incomplete right bundle branch block. No changes from previous tracings  Assessment/Plan Principal Problem:   Syncope Active Problems:   Atrial fibrillation (HCC)   Coronary artery disease   Pulmonary hypertension ([place])   Diabetes ([place])   AKI (acute kidney injury) ([place])  #1. Syncope. Etiology uncertain. Reportedly 3-5 episodes over the last 2 days. Most associated with position change. No recent changes in medications. She is afebrile and nontoxic appearing. Chest x-ray and urinalysis unremarkable. No lacunar-like derangement -Admit to telemetry for observation -Check orthostatic blood pressure tomorrow -Gentle IV fluids -Echocardiogram -Hold Celexa, Aricept, Lasix and tramadol  #2. Hypertension. Fair control in the emergency department. Home medications include Lasix and metoprolol and benasopril -Hold Lasix and benazepril secondary to acute kidney injury -Monitor closely -Resume as indicated  #3. Acute kidney injury. Likely related to above. -Gentle IV fluids -Monitor urine output -Check in the a.m.  #4. A. fib. On Xarelto.Mali score 6. Rate controlled -Continue beta blocker -Continue Xarelto  5. Diabetes. Appears controlled. Home medications include Actos and metformin. Serum glucose 286 on admission -Hold oral agents for now -Obtain hemoglobin A1c -Use sliding scale insulin for optimal control  #6. CAD. Denies any chest pain. Does post CABG in 2000. -Continue home aspirin and statin -Monitor on telemetry -Cycle troponins   Code Status:  full DVT Prophylaxis: Family Communication: husband and son at bedside Disposition Plan: home hopefully tomorro  Time spent: 64 minutes  [facility]    Supervising attending attestation HPI: Patient had multiple episodes of syncope. Each episode is associated with changing in her position. Denies having any similar episodes in the past. Recently had some medication changes with blood pressure. While evaluating the patient she had one of her episode. Her blood pressure dropped down to 80 as well as heart rate dropped  down to 40.  Exam: Orthostatic positive with blood pressure dropping from 100-80 on sitting up.  Plan: Syncope Discontinue all antihypertensive medication. 500 mL normal saline bolus. Hold Lasix at present. Monitor on telemetry. Syncope is most likely secondary to blood pressure medications.  Type 2 diabetes mellitus. Continue with sliding scale insulin.  I have interviewed and examined the patient and agree with the documentation and management as recorded by the Advanced Practice Provider. I have made any necessary editorial changes.I have independently reviewed the clinical findings, lab results and imaging studies. family was present at bedside, opportunity was given to ask question and all questions were answered satisfactorily at the time of interview.  Author: Berle Mull, MD [facility] 04/26/2015 7:19 PM

## 2015-04-26 NOTE — Telephone Encounter (Signed)
Agree with advice given

## 2015-04-26 NOTE — ED Notes (Signed)
Pt sts syncope x 2 this morning while urinating per husband; pt denies knowledge of event; pt denies complaint currently; pt sts not feeling well recently

## 2015-04-26 NOTE — ED Provider Notes (Signed)
CSN: [id number]     Arrival date & time 04/26/15  1151 History   First MD Initiated Contact with Patient 04/26/15 1437     Chief Complaint  Patient presents with  . Loss of Consciousness     (Consider location/radiation/quality/duration/timing/severity/associated sxs/prior Treatment) Patient is a 82 y.o. female presenting with syncope.  Loss of Consciousness  Patient is has 3 syncopal events since 3 AM today. First episode at 3 AM while sitting on commode. The second 2 episodes occurred while standing. She did not injure herself. She is presently asymptomatic. No treatment prior to coming here. Denies lightheadedness with standing. No other associated symptoms. Denies blood per rectum or dark stools Past Medical History  Diagnosis Date  . HX: long term anticoagulant use   . PAF (paroxysmal atrial fibrillation) ([place])   . Coronary artery disease   . Diabetes mellitus   . Dyslipidemia   . Pulmonary hypertension ([place])   . Diastolic dysfunction   . Skin cancer     nose, leg  . Edema   . MI, old 74    involving small LCX  . S/P CABG (coronary artery bypass graft) 2000   Past Surgical History  Procedure Laterality Date  . Coronary artery bypass graft  2000    LIMA to LAD; SVG to intermediate, SVG to PD and RCA per Dr. Servando Snare  . Tubal ligation  1961  . Other surgical history  1999    historectomy  . Cardiac catheterization  1995    dr Oran Rein   Family History  Problem Relation Age of Onset  . Heart attack Mother   . Transient ischemic attack Father    Social History  Substance Use Topics  . Smoking status: Never Smoker   . Smokeless tobacco: None  . Alcohol Use: No   OB History    No data available     Review of Systems  Constitutional: Negative.   HENT: Negative.   Respiratory: Negative.   Cardiovascular: Positive for syncope.       Syncope  Gastrointestinal: Negative.   Musculoskeletal: Negative.   Skin: Negative.   Psychiatric/Behavioral: Negative.   All  other systems reviewed and are negative.     Allergies  Aspirin; Dimetapp cold-allergy; Ilosone; Morphine and related; Penicillins; Restoril; and Zolpidem tartrate  Home Medications   Prior to Admission medications   Medication Sig Start Date End Date Taking? Authorizing Provider  ALPRAZolam (XANAX) 0.25 MG tablet Take 0.25 mg by mouth every 6 (six) hours as needed for anxiety.    Historical Provider, MD  ammonium lactate (AMLACTIN) 12 % cream Apply 1 g topically as directed.  11/01/11   Historical Provider, MD  atorvastatin (LIPITOR) 10 MG tablet TAKE 1 TABLET BY MOUTH EVERY DAY 12/18/14   Darlin Coco, MD  benazepril (LOTENSIN) 20 MG tablet TAKE 1 TABLET BY MOUTH DAILY 02/14/14   Darlin Coco, MD  benazepril (LOTENSIN) 20 MG tablet TAKE 1 TABLET BY MOUTH DAILY 03/27/15   Darlin Coco, MD  Calcium-Vitamin D (CALTRATE 600 PLUS-VIT D PO) Take 1 tablet by mouth 2 (two) times daily.      Historical Provider, MD  citalopram (CELEXA) 20 MG tablet Take 1 tablet by mouth Daily. 10/23/10   Historical Provider, MD  donepezil (ARICEPT) 10 MG tablet Take 10 mg by mouth daily. 04/21/15   Historical Provider, MD  fexofenadine (ALLEGRA) 60 MG tablet Take 60 mg by mouth daily as needed for allergies or rhinitis.     Historical Provider, MD  furosemide (LASIX) 40 MG tablet Take 1 tablet (40 mg total) by mouth every other day. Alternates with 80 mg by mouth daily 10/03/14   Darlin Coco, MD  furosemide (LASIX) 80 MG tablet Take 1 tablet (80 mg total) by mouth every other day. ALTERNATING WITH 40 MG TABLET 10/03/14   Darlin Coco, MD  glimepiride (AMARYL) 2 MG tablet Take 1 mg by mouth daily before breakfast.      Historical Provider, MD  metFORMIN (GLUCOPHAGE-XR) 500 MG 24 hr tablet Take 500 mg by mouth 2 (two) times daily.      Historical Provider, MD  metoprolol tartrate (LOPRESSOR) 25 MG tablet Take 12.5 mg by mouth 2 (two) times daily.    Historical Provider, MD  Multiple Vitamins-Minerals  (MULTIVITAL PO) Take 1 capsule by mouth daily.     Historical Provider, MD  nitroGLYCERIN (NITROSTAT) 0.4 MG SL tablet Place 0.4 mg under the tongue every 5 (five) minutes as needed for chest pain ((MAX 3 doses)).    Historical Provider, MD  omeprazole (PRILOSEC) 20 MG capsule TAKE ONE CAPSULE BY MOUTH TWICE A DAY 02/01/14   Darlin Coco, MD  pioglitazone (ACTOS) 45 MG tablet TAKE 1/2 TABLET BY MOUTH EVERY DAY 12/22/11   Darlin Coco, MD  rivaroxaban (XARELTO) 20 MG TABS tablet Take 1 tablet (20 mg total) by mouth daily with supper. 03/12/15   Burtis Junes, NP  traZODone (DESYREL) 50 MG tablet Take 50 mg by mouth at bedtime as needed for sleep.    Historical Provider, MD   BP 118/107 mmHg  Pulse 76  Temp(Src) 97.9 F (36.6 C) (Oral)  Resp 18  SpO2 98% Physical Exam  Constitutional: She appears well-developed and well-nourished.  HENT:  Head: Normocephalic and atraumatic.  Eyes: Conjunctivae are normal. Pupils are equal, round, and reactive to light.  Neck: Neck supple. No tracheal deviation present. No thyromegaly present.  Cardiovascular: Normal rate.   No murmur heard. Irregularly irregular  Pulmonary/Chest: Effort normal and breath sounds normal.  Abdominal: Soft. Bowel sounds are normal. She exhibits no distension. There is no tenderness.  Musculoskeletal: Normal range of motion. She exhibits no edema or tenderness.  Neurological: She is alert. Coordination normal.  Skin: Skin is warm and dry. No rash noted.  Psychiatric: She has a normal mood and affect.  Nursing note and vitals reviewed.   ED Course  Procedures (including critical care time) Labs Review Labs Reviewed  BASIC METABOLIC PANEL - Abnormal; Notable for the following:    Chloride 93 (*)    Glucose, Bld 286 (*)    BUN 26 (*)    Creatinine, Ser 1.14 (*)    GFR calc non Af Amer 45 (*)    GFR calc Af Amer 52 (*)    Anion gap 16 (*)    All other components within normal limits  CBC - Abnormal; Notable  for the following:    WBC 11.3 (*)    RBC 5.18 (*)    Hemoglobin 15.8 (*)    All other components within normal limits  CBG MONITORING, ED - Abnormal; Notable for the following:    Glucose-Capillary 235 (*)    All other components within normal limits  URINALYSIS, ROUTINE W REFLEX MICROSCOPIC (NOT AT [facility])    Imaging Review No results found. I have personally reviewed and evaluated these images and lab results as part of my medical decision-making.   EKG Interpretation   Date/Time:  Saturday April 26 2015 12:08:39 EST Ventricular Rate:  55  PR Interval:    QRS Duration: 98 QT Interval:  406 QTC Calculation: 459 R Axis:   90 Text Interpretation:  Atrial fibrillation Rightward axis Incomplete right  bundle branch block ST \\T \ T wave abnormality, consider inferior ischemia  Abnormal ECG No significant change since last tracing Confirmed by  Winfred Leeds  MD, Patrice Matthew [phone]) on 04/26/2015 2:37:22 PM     Results for orders placed or performed during the hospital encounter of 0000000  Basic metabolic panel  Result Value Ref Range   Sodium 137 135 - 145 mmol/L   Potassium 4.4 3.5 - 5.1 mmol/L   Chloride 93 (L) 101 - 111 mmol/L   CO2 28 22 - 32 mmol/L   Glucose, Bld 286 (H) 65 - 99 mg/dL   BUN 26 (H) 6 - 20 mg/dL   Creatinine, Ser 1.14 (H) 0.44 - 1.00 mg/dL   Calcium 9.7 8.9 - 10.3 mg/dL   GFR calc non Af Amer 45 (L) >60 mL/min   GFR calc Af Amer 52 (L) >60 mL/min   Anion gap 16 (H) 5 - 15  CBC  Result Value Ref Range   WBC 11.3 (H) 4.0 - 10.5 K/uL   RBC 5.18 (H) 3.87 - 5.11 MIL/uL   Hemoglobin 15.8 (H) 12.0 - 15.0 g/dL   HCT 45.4 36.0 - 46.0 %   MCV 87.6 78.0 - 100.0 fL   MCH 30.5 26.0 - 34.0 pg   MCHC 34.8 30.0 - 36.0 g/dL   RDW 14.9 11.5 - 15.5 %   Platelets 194 150 - 400 K/uL  CBG monitoring, ED  Result Value Ref Range   Glucose-Capillary 235 (H) 65 - 99 mg/dL   No results found.  MDM  In light of 3 syncopal events in 12 hours patient will be placed on telemetry,  23 hour observation. Case discussed with Ms. Renard Hamper from prior hospitalist service who will evaluate patient in person in temporary or living orders written by me Final diagnoses:  None   Dx #1 syncope #2 hyperglycemia     Orlie Dakin, MD 04/26/15 1544

## 2015-04-26 NOTE — Telephone Encounter (Signed)
Patient's neighbor called answering service - she is concerned. The patient told her she had passed out multiple times in the last 2 days. The neighbor asked me if I could call the patient's house to check on her and advised I should talk to her husband for more information when I call. I called her house and reached her husband. I asked to speak to the patient. He reported the patient was currently asleep but did confirm she had 3 fainting episodes this AM. He suspected it was related to low blood pressure. I told him unfortunately there is no way to know for sure and with 5 episodes of syncope in 2 days, she needs to be evaluated immediately. I advised they seek medical attention immediately and proceed to the ER for further evaluation. He verbalized understanding and gratitude. Dayna Dunn PA-C

## 2015-04-27 ENCOUNTER — Encounter (HOSPITAL_COMMUNITY): Payer: Self-pay

## 2015-04-27 ENCOUNTER — Observation Stay (HOSPITAL_COMMUNITY): Payer: Medicare Other

## 2015-04-27 ENCOUNTER — Encounter (HOSPITAL_COMMUNITY): Payer: Medicare Other

## 2015-04-27 DIAGNOSIS — N179 Acute kidney failure, unspecified: Secondary | ICD-10-CM

## 2015-04-27 DIAGNOSIS — R55 Syncope and collapse: Secondary | ICD-10-CM | POA: Diagnosis not present

## 2015-04-27 DIAGNOSIS — I4891 Unspecified atrial fibrillation: Secondary | ICD-10-CM

## 2015-04-27 LAB — BASIC METABOLIC PANEL
ANION GAP: 15 (ref 5–15)
BUN: 36 mg/dL — AB (ref 6–20)
CO2: 26 mmol/L (ref 22–32)
Calcium: 8.9 mg/dL (ref 8.9–10.3)
Chloride: 97 mmol/L — ABNORMAL LOW (ref 101–111)
Creatinine, Ser: 1.14 mg/dL — ABNORMAL HIGH (ref 0.44–1.00)
GFR, EST AFRICAN AMERICAN: 52 mL/min — AB (ref >60)
GFR, EST NON AFRICAN AMERICAN: 45 mL/min — AB (ref >60)
Glucose, Bld: 179 mg/dL — ABNORMAL HIGH (ref 65–99)
POTASSIUM: 3.1 mmol/L — AB (ref 3.5–5.1)
SODIUM: 138 mmol/L (ref 135–145)

## 2015-04-27 LAB — GLUCOSE, CAPILLARY
GLUCOSE-CAPILLARY: 181 mg/dL — AB (ref 65–99)
Glucose-Capillary: 192 mg/dL — ABNORMAL HIGH (ref 65–99)
Glucose-Capillary: 171 mg/dL — ABNORMAL HIGH (ref 65–99)
Glucose-Capillary: 178 mg/dL — ABNORMAL HIGH (ref 65–99)
Glucose-Capillary: 280 mg/dL — ABNORMAL HIGH (ref 65–99)

## 2015-04-27 LAB — CBC
HEMATOCRIT: 41.2 % (ref 36.0–46.0)
Hemoglobin: 14.3 g/dL (ref 12.0–15.0)
MCH: 29.9 pg (ref 26.0–34.0)
MCHC: 34.7 g/dL (ref 30.0–36.0)
MCV: 86.2 fL (ref 78.0–100.0)
Platelets: 151 10*3/uL (ref 150–400)
RBC: 4.78 MIL/uL (ref 3.87–5.11)
RDW: 14.9 % (ref 11.5–15.5)
WBC: 7.9 10*3/uL (ref 4.0–10.5)

## 2015-04-27 LAB — TROPONIN I: Troponin I: <0.03 ng/mL (ref <0.031)

## 2015-04-27 MED ORDER — SODIUM CHLORIDE 0.9 % IV SOLN
INTRAVENOUS | Status: AC
  Administered 2015-04-27: 13:00:00 via INTRAVENOUS
  Filled 2015-04-27: qty 1000

## 2015-04-27 MED ORDER — POTASSIUM CHLORIDE CRYS ER 20 MEQ PO TBCR
40.0000 meq | EXTENDED_RELEASE_TABLET | Freq: Once | ORAL | Status: AC
  Administered 2015-04-27: 40 meq via ORAL
  Filled 2015-04-27: qty 2

## 2015-04-27 MED ORDER — INFLUENZA VAC SPLIT QUAD 0.5 ML IM SUSY
0.5000 mL | PREFILLED_SYRINGE | INTRAMUSCULAR | Status: DC
  Filled 2015-04-27: qty 0.5

## 2015-04-27 MED ORDER — METOPROLOL TARTRATE 12.5 MG HALF TABLET
12.5000 mg | ORAL_TABLET | Freq: Two times a day (BID) | ORAL | Status: DC
  Administered 2015-04-27 – 2015-04-28 (×2): 12.5 mg via ORAL
  Filled 2015-04-27 (×3): qty 1

## 2015-04-27 NOTE — Discharge Instructions (Signed)

## 2015-04-27 NOTE — Progress Notes (Addendum)
PROGRESS NOTE  Karen Arroyo [id number] DOB: 03/16/1934 DOA: 04/26/2015 PCP: Tivis Ringer, MD Brief History 82 year old female with a history of atrial fibrillation on rivaroxaban, diabetes mellitus, hypertension, hyperlipidemia, coronary artery disease status post CABG presents with 5-6 episodes of syncope in the past 2 days. The patient's initial episode of syncope occurred as she felt like she was "slipping off the commode". She had another episode of syncope as her husband was trying to stand her up. Her subsequent episodes also occurred with positional changes. The patient was noted to have positive orthostatic vital signs in the emergency department. The patient denies any recent changes in her medications or new medications. She denies any recent vomiting, diarrhea, chest pain, shortness breath, abdominal pain, dizziness. She denies any fevers, chills, headaches, neck pain. Initial lab work revealed a serum creatinine 1.14 with WBC 11.3 and hemoglobin 15.8.  Assessment/Plan: Syncope -likely due to orthostatic hypotension in the setting of anti-HTN meds and furosemide dosing -start IVF -may need to tolerated higher BP -Echo -continue telemtry -Hold benazepril, furosemide AKI -03/12/2015 creatinine 0.75 -04/26/2015 serum creatinine 1.14 -Due to volume depletion--pt also hemoconcentrated  -Start gentle hydration -Follow BMP -Hold furosemide -Hold benazepril Atrial fibrillation -Rate controlled -Continue rivaroxaban -Continue low-dose metoprolol tartrate Diabetes mellitus type 2 -Hold metformin and Actos -NovoLog sliding scale  Hypertension  -We'll allow for higher blood pressure given orthostatic hypotension  -Hold been aspirin and furosemide  Hyperlipidemia  -Continue statin  Coronary artery disease with history of CABG  -No anginal symptoms -Continue aspirin and rivaroxaban Lower extremity pain and edema -duplex r/o DVT Hypokalemia -replete -check  mag Family Communication:   Pt at beside Disposition Plan:   Home when medically stable       Procedures/Studies: X-ray Chest Pa And Lateral  04/26/2015  CLINICAL DATA:  82 year old female with syncope today while using a restroom. Initial encounter. EXAM: CHEST  2 VIEW COMPARISON:  11/25/2010 and earlier. FINDINGS: Chronic cardiomegaly is stable to mildly increased and moderate to severe overall. Sequelae of CABG. Other mediastinal contours are within normal limits. Visualized tracheal air column is within normal limits. Chronic large lung volumes. No pneumothorax, pulmonary edema, pleural effusion or confluent pulmonary opacity. Osteopenia. Moderate upper thoracic compression fracture is new since 2012. Other visualized osseous structures appear stable. Calcified aortic atherosclerosis. IMPRESSION: 1. Chronic cardiomegaly and hyperinflation. No acute cardiopulmonary abnormality. 2. Chronic appearing upper thoracic compression fracture is new since 2012. Electronically Signed   By: Genevie Ann M.D.   On: 04/26/2015 16:40         Subjective: Patient denies fevers, chills, headache, chest pain, dyspnea, nausea, vomiting, diarrhea, abdominal pain, dysuria, hematuria   Objective: Filed Vitals:   04/26/15 1724 04/26/15 1900 04/26/15 2041 04/27/15 0412  BP: 105/56 97/58 119/61 123/78  Pulse: 85 76 87 96  Temp:  98 F (36.7 C)  98.1 F (36.7 C)  TempSrc:  Oral  Oral  Resp:  18 18 18   Height:    5' 4.02" (1.626 m)  Weight:    62.1 kg (136 lb 14.5 oz)  SpO2:  98% 100% 98%   No intake or output data in the 24 hours ending 04/27/15 0753 Weight change:  Exam:   General:  Pt is alert, follows commands appropriately, not in acute distress  HEENT: No icterus, No thrush, No neck mass, /AT  Cardiovascular: RRR, S1/S2, no rubs, no gallops  Respiratory: CTA bilaterally, no wheezing, no crackles, no rhonchi  Abdomen: Soft/+BS, non tender, non distended, no guarding  Extremities: 1+  LEedema, No lymphangitis, No petechiae, No rashes, no synovitis  Data Reviewed: Basic Metabolic Panel:  Recent Labs Lab 04/26/15 1212 04/27/15 0554  NA 137 138  K 4.4 3.1*  CL 93* 97*  CO2 28 26  GLUCOSE 286* 179*  BUN 26* 36*  CREATININE 1.14* 1.14*  CALCIUM 9.7 8.9   Liver Function Tests: No results for input(s): AST, ALT, ALKPHOS, BILITOT, PROT, ALBUMIN in the last 168 hours. No results for input(s): LIPASE, AMYLASE in the last 168 hours. No results for input(s): AMMONIA in the last 168 hours. CBC:  Recent Labs Lab 04/26/15 1212 04/27/15 0554  WBC 11.3* 7.9  HGB 15.8* 14.3  HCT 45.4 41.2  MCV 87.6 86.2  PLT 194 151   Cardiac Enzymes:  Recent Labs Lab 04/26/15 1607 04/26/15 2111 04/27/15 0554  TROPONINI <0.03 <0.03 <0.03   BNP: Invalid input(s): POCBNP CBG:  Recent Labs Lab 04/26/15 1207 04/26/15 1651 04/26/15 2243 04/27/15 0623  GLUCAP 235* 215* 181* 192*    No results found for this or any previous visit (from the past 240 hour(s)).   Scheduled Meds: . aspirin EC  81 mg Oral QHS  . atorvastatin  10 mg Oral Daily  . [START ON 04/28/2015] Influenza vac split quadrivalent PF  0.5 mL Intramuscular Tomorrow-1000  . insulin aspart  0-15 Units Subcutaneous TID WC  . insulin aspart  0-5 Units Subcutaneous QHS  . Rivaroxaban  15 mg Oral Q supper  . sodium chloride  3 mL Intravenous Q12H   Continuous Infusions:    Verenis Nicosia, DO  [facility] Pager [phone]  If 7PM-7AM, please contact night-coverage [email or web address] Password [facility] 04/27/2015, 7:53 AM

## 2015-04-28 ENCOUNTER — Observation Stay (HOSPITAL_BASED_OUTPATIENT_CLINIC_OR_DEPARTMENT_OTHER): Payer: Medicare Other

## 2015-04-28 DIAGNOSIS — R55 Syncope and collapse: Secondary | ICD-10-CM | POA: Diagnosis not present

## 2015-04-28 DIAGNOSIS — N179 Acute kidney failure, unspecified: Secondary | ICD-10-CM | POA: Diagnosis not present

## 2015-04-28 DIAGNOSIS — I4891 Unspecified atrial fibrillation: Secondary | ICD-10-CM | POA: Diagnosis not present

## 2015-04-28 DIAGNOSIS — M79606 Pain in leg, unspecified: Secondary | ICD-10-CM

## 2015-04-28 LAB — MAGNESIUM: Magnesium: 1.7 mg/dL (ref 1.7–2.4)

## 2015-04-28 LAB — HEMOGLOBIN A1C
HEMOGLOBIN A1C: 8.4 % — AB (ref 4.8–5.6)
Mean Plasma Glucose: 194 mg/dL

## 2015-04-28 LAB — URINALYSIS, ROUTINE W REFLEX MICROSCOPIC
Bilirubin Urine: NEGATIVE
GLUCOSE, UA: NEGATIVE mg/dL
Hgb urine dipstick: NEGATIVE
Ketones, ur: NEGATIVE mg/dL
LEUKOCYTES UA: NEGATIVE
Nitrite: NEGATIVE
PH: 5.5 (ref 5.0–8.0)
Protein, ur: NEGATIVE mg/dL
SPECIFIC GRAVITY, URINE: 1.009 (ref 1.005–1.030)

## 2015-04-28 LAB — GLUCOSE, CAPILLARY
Glucose-Capillary: 152 mg/dL — ABNORMAL HIGH (ref 65–99)
Glucose-Capillary: 140 mg/dL — ABNORMAL HIGH (ref 65–99)

## 2015-04-28 MED ORDER — INFLUENZA VAC SPLIT QUAD 0.5 ML IM SUSY: 0.5000 mL | PREFILLED_SYRINGE | INTRAMUSCULAR | Status: DC

## 2015-04-28 MED ORDER — RIVAROXABAN 15 MG PO TABS: 15.0000 mg | ORAL_TABLET | Freq: Every day | ORAL | Status: AC

## 2015-04-28 NOTE — Progress Notes (Signed)
VASCULAR LAB PRELIMINARY  PRELIMINARY  PRELIMINARY  PRELIMINARY    Bilateral lower extremity venous Doppler has been completed.  Bilateral:  No evidence of DVT, superficial thrombosis, or Baker's Cyst.   Janifer Adie, RVT, RDMS 04/28/2015, 10:22 AM

## 2015-04-28 NOTE — Progress Notes (Signed)
Pt dressed and ready for discharge, IV and telemetry d/c. Instructions reviewed with pt, spouse and son.  Pt and family express understanding of importance of keeping scheduled f/u appointments, following medication regimen and following instructions specific to healthcare needs.  Pt discharged home with husband and son.

## 2015-04-28 NOTE — Care Management Note (Addendum)
Case Management Note  Patient Details  Name: JAYSHA LABA MRN: [id number] Date of Birth: Jun 24, 1933  Subjective/Objective:   Pt admitted with syncope                 Action/Plan:  Pt is from home.  Pt is active with [facility] for [facility] and PT,  CM requested resumption orders   Expected Discharge Date:                  Expected Discharge Plan:  [place]  In-House Referral:     Discharge planning Services  CM Consult  Post Acute Care Choice:  Resumption of Svcs/PTA Provider Choice offered to:  Patient  DME Arranged:    DME Agency:     HH Arranged:  RN, PT [place] Agency:  [place]  Status of Service:   Complete, will sign off  Medicare Important Message Given:    Date Medicare IM Given:    Medicare IM give by:    Date Additional Medicare IM Given:    Additional Medicare Important Message give by:     If discussed at [place] of Stay Meetings, dates discussed:    Additional Comments: Pt along with husband verified that pt is active with [facility]. Agency aware of pts admit and will resume services at discharge Maryclare Labrador, RN 04/28/2015, 1:17 PM

## 2015-04-28 NOTE — Discharge Summary (Signed)
Physician Discharge Summary  Karen Arroyo [id number] DOB: May 14, 1933 DOA: 04/26/2015  PCP: Tivis Ringer, MD  Admit date: 04/26/2015 Discharge date: 04/28/2015  Recommendations for Outpatient Follow-up:  1. Pt will need to follow up with PCP in 2 weeks post discharge 2. Please obtain BMP in one week--if Creatinine clearance >50, Xarelto can be increased back to 20 mg daily;  Pt going home on 15 mg daily  Discharge Diagnoses:  Syncope -likely due to orthostatic hypotension in the setting of anti-HTN meds and furosemide dosing -start IVF -may need to tolerated higher BP -Echo--EF 50-55%, no WMA, no major valve abnormalities -continue telemtry -Hold benazepril, furosemide during the hospitalization AKI -03/12/2015 creatinine 0.75 -04/26/2015 serum creatinine 1.14 -Due to volume depletion--pt also hemoconcentrated  -Startd gentle hydration -Follow BMP -Hold furosemide-->restart on 04/29/15 -Hold benazepril--will not restart--BP remained stable Atrial fibrillation -Rate controlled -Continue rivaroxaban -Continue low-dose metoprolol tartrate Diabetes mellitus type 2 -Hold metformin and Actos -NovoLog sliding scale  Hypertension  -We'll allow for higher blood pressure given orthostatic hypotension  -Hold been aspirin and furosemide  Hyperlipidemia  -Continue statin  Coronary artery disease with history of CABG  -No anginal symptoms -Continue aspirin and rivaroxaban Lower extremity pain and edema -duplex r/o DVT--neg Hypokalemia -replete -check mag--1.7  Discharge Condition: stable  Disposition: home  Diet:heart healthy Wt Readings from Last 3 Encounters:  04/28/15 63.2 kg (139 lb 5.3 oz)  04/23/15 63.413 kg (139 lb 12.8 oz)  03/12/15 66.679 kg (147 lb)    History of present illness:  82 year old female with a history of atrial fibrillation on rivaroxaban, diabetes mellitus, hypertension, hyperlipidemia, coronary artery disease status post CABG presents  with 5-6 episodes of syncope in the past 2 days. The patient's initial episode of syncope occurred as she felt like she was "slipping off the commode". She had another episode of syncope as her husband was trying to stand her up. Her subsequent episodes also occurred with positional changes. The patient was noted to have positive orthostatic vital signs in the emergency department. The patient denies any recent changes in her medications or new medications. She denies any recent vomiting, diarrhea, chest pain, shortness breath, abdominal pain, dizziness. She denies any fevers, chills, headaches, neck pain. Initial lab work revealed a serum creatinine 1.14 with WBC 11.3 and hemoglobin 15.8. Discharge Exam: Filed Vitals:   04/28/15 0610 04/28/15 1008  BP: 133/84 135/65  Pulse: 77 75  Temp: 97.8 F (36.6 C)   Resp: 18    Filed Vitals:   04/27/15 0920 04/27/15 1945 04/28/15 0610 04/28/15 1008  BP: 78/52 130/82 133/84 135/65  Pulse: 101 89 77 75  Temp:  98.3 F (36.8 C) 97.8 F (36.6 C)   TempSrc:  Oral Oral   Resp:  18 18   Height:      Weight:   63.2 kg (139 lb 5.3 oz)   SpO2:  100% 99%    General: A&O x 3, NAD, pleasant, cooperative Cardiovascular: RRR, no rub, no gallop, no S3 Respiratory: CTAB, no wheeze, no rhonchi Abdomen:soft, nontender, nondistended, positive bowel sounds Extremities: 2+LE edema, No lymphangitis, no petechiae  Discharge Instructions      Discharge Instructions    Diet - low sodium heart healthy    Complete by:  As directed      Increase activity slowly    Complete by:  As directed             Medication List    STOP taking these medications  benazepril 20 MG tablet  Commonly known as:  LOTENSIN      TAKE these medications        ALPRAZolam 0.25 MG tablet  Commonly known as:  XANAX  Take 0.25 mg by mouth every 6 (six) hours as needed for anxiety.     ammonium lactate 12 % cream  Commonly known as:  AMLACTIN  Apply 1 g topically as  directed.     aspirin EC 81 MG tablet  Take 81 mg by mouth at bedtime.     atorvastatin 10 MG tablet  Commonly known as:  LIPITOR  TAKE 1 TABLET BY MOUTH EVERY DAY     CALTRATE 600 PLUS-VIT D PO  Take 1 tablet by mouth 2 (two) times daily.     citalopram 20 MG tablet  Commonly known as:  CELEXA  Take 1 tablet by mouth Daily.     donepezil 10 MG tablet  Commonly known as:  ARICEPT  Take 10 mg by mouth daily.     fexofenadine 60 MG tablet  Commonly known as:  ALLEGRA  Take 60 mg by mouth daily as needed for allergies or rhinitis.     furosemide 40 MG tablet  Commonly known as:  LASIX  Take 1 tablet (40 mg total) by mouth every other day. Alternates with 80 mg by mouth daily     furosemide 80 MG tablet  Commonly known as:  LASIX  Take 1 tablet (80 mg total) by mouth every other day. ALTERNATING WITH 40 MG TABLET     JOINT SUPPORT PO  Take 1-2 capsules by mouth daily.     metFORMIN 500 MG 24 hr tablet  Commonly known as:  GLUCOPHAGE-XR  Take 1,000 mg by mouth 2 (two) times daily.     metoprolol tartrate 25 MG tablet  Commonly known as:  LOPRESSOR  Take 12.5 mg by mouth 2 (two) times daily.     MULTIVITAL PO  Take 1 capsule by mouth daily.     nitroGLYCERIN 0.4 MG SL tablet  Commonly known as:  NITROSTAT  Place 0.4 mg under the tongue every 5 (five) minutes as needed for chest pain ((MAX 3 doses)).     pioglitazone 45 MG tablet  Commonly known as:  ACTOS  TAKE 1/2 TABLET BY MOUTH EVERY DAY     Rivaroxaban 15 MG Tabs tablet  Commonly known as:  XARELTO  Take 1 tablet (15 mg total) by mouth daily with supper.     traMADol 50 MG tablet  Commonly known as:  ULTRAM  Take 50-100 mg by mouth every 8 (eight) hours as needed.         The results of significant diagnostics from this hospitalization (including imaging, microbiology, ancillary and laboratory) are listed below for reference.    Significant Diagnostic Studies: X-ray Chest Pa And Lateral  04/26/2015   CLINICAL DATA:  82 year old female with syncope today while using a restroom. Initial encounter. EXAM: CHEST  2 VIEW COMPARISON:  11/25/2010 and earlier. FINDINGS: Chronic cardiomegaly is stable to mildly increased and moderate to severe overall. Sequelae of CABG. Other mediastinal contours are within normal limits. Visualized tracheal air column is within normal limits. Chronic large lung volumes. No pneumothorax, pulmonary edema, pleural effusion or confluent pulmonary opacity. Osteopenia. Moderate upper thoracic compression fracture is new since 2012. Other visualized osseous structures appear stable. Calcified aortic atherosclerosis. IMPRESSION: 1. Chronic cardiomegaly and hyperinflation. No acute cardiopulmonary abnormality. 2. Chronic appearing upper thoracic compression fracture is new since  2012. Electronically Signed   By: Genevie Ann M.D.   On: 04/26/2015 16:40     Microbiology: No results found for this or any previous visit (from the past 240 hour(s)).   Labs: Basic Metabolic Panel:  Recent Labs Lab 04/26/15 1212 04/27/15 0554 04/28/15 0246  NA 137 138  --   K 4.4 3.1*  --   CL 93* 97*  --   CO2 28 26  --   GLUCOSE 286* 179*  --   BUN 26* 36*  --   CREATININE 1.14* 1.14*  --   CALCIUM 9.7 8.9  --   MG  --   --  1.7   Liver Function Tests: No results for input(s): AST, ALT, ALKPHOS, BILITOT, PROT, ALBUMIN in the last 168 hours. No results for input(s): LIPASE, AMYLASE in the last 168 hours. No results for input(s): AMMONIA in the last 168 hours. CBC:  Recent Labs Lab 04/26/15 1212 04/27/15 0554  WBC 11.3* 7.9  HGB 15.8* 14.3  HCT 45.4 41.2  MCV 87.6 86.2  PLT 194 151   Cardiac Enzymes:  Recent Labs Lab 04/26/15 1607 04/26/15 2111 04/27/15 0554  TROPONINI <0.03 <0.03 <0.03   BNP: Invalid input(s): POCBNP CBG:  Recent Labs Lab 04/27/15 1118 04/27/15 1632 04/27/15 2145 04/28/15 0621 04/28/15 1055  GLUCAP 171* 178* 280* 152* 140*    Time coordinating  discharge:  Greater than 30 minutes  Signed:  Steven Veazie, DO [facility] Pager: [phone] 04/28/2015, 1:12 PM

## 2015-04-30 ENCOUNTER — Ambulatory Visit (INDEPENDENT_AMBULATORY_CARE_PROVIDER_SITE_OTHER): Payer: Medicare Other | Admitting: Ophthalmology

## 2015-04-30 DIAGNOSIS — H35033 Hypertensive retinopathy, bilateral: Secondary | ICD-10-CM

## 2015-04-30 DIAGNOSIS — H43813 Vitreous degeneration, bilateral: Secondary | ICD-10-CM

## 2015-04-30 DIAGNOSIS — I10 Essential (primary) hypertension: Secondary | ICD-10-CM

## 2015-04-30 DIAGNOSIS — H353134 Nonexudative age-related macular degeneration, bilateral, advanced atrophic with subfoveal involvement: Secondary | ICD-10-CM

## 2015-05-14 ENCOUNTER — Encounter: Payer: Self-pay | Admitting: Cardiology

## 2015-05-14 ENCOUNTER — Ambulatory Visit (INDEPENDENT_AMBULATORY_CARE_PROVIDER_SITE_OTHER): Payer: Medicare Other | Admitting: Cardiology

## 2015-05-14 VITALS — BP 130/70 | HR 70 | Ht 64.0 in | Wt 132.8 lb

## 2015-05-14 DIAGNOSIS — R55 Syncope and collapse: Secondary | ICD-10-CM | POA: Diagnosis not present

## 2015-05-14 DIAGNOSIS — I5189 Other ill-defined heart diseases: Secondary | ICD-10-CM

## 2015-05-14 DIAGNOSIS — I480 Paroxysmal atrial fibrillation: Secondary | ICD-10-CM | POA: Diagnosis not present

## 2015-05-14 DIAGNOSIS — I519 Heart disease, unspecified: Secondary | ICD-10-CM

## 2015-05-14 NOTE — Progress Notes (Signed)
Cardiology Office Note   Date:  05/14/2015   ID:  Karen Arroyo, DOB Feb 22, 1934, MRN [id number]  PCP:  Karen Ringer, MD  Cardiologist: Karen Coco MD  No chief complaint on file.     History of Present Illness: Karen Arroyo is a 82 y.o. female who presents for Follow-up office visit  . She has a history of ischemic heart disease and had coronary artery bypass graft surgery in 2000. In August 2012 she had recurrent angina pectoris and underwent cardiac catheterization which did not show any new lesions. Since then she has had very little in the way of chest discomfort. The patient also has a history of diastolic dysfunction, pulmonary hypertension, hypercholesterolemia, and diabetes. She has had a history of intermittent atrial fib and is on long-term Coumadin. Since last visit she's had no new cardiac symptoms. She was having more symptoms of arthritis with painful right knee with motion but does not intend to have any evaluation or surgery at this time. She has 2 young grandchildren down in [place]. She does a lot of babysitting for them.  Since last visit the patient was hospitalized on 04/26/15 for recurrent episodes of syncope.  She was found to have acute on chronic kidney dysfunction and dehydration.  She responded to IV fluids.  She returns now for follow-up.  She states that she is feeling well.  She has had no further episodes of dizziness or syncope.  She remains in atrial fibrillation with controlled ventricular response.She is on Xarelto at renal dose. The patient has a history of diabetes.  She denies any hypoglycemic episodes. She is on low-dose atorvastatin 10 mg daily.  She has not been having any myalgias from the statin therapy  Past Medical History  Diagnosis Date  . HX: long term anticoagulant use   . PAF (paroxysmal atrial fibrillation) ([place])   . Coronary artery disease   . Diabetes mellitus   . Dyslipidemia   . Pulmonary hypertension  ([place])   . Diastolic dysfunction   . Skin cancer     nose, leg  . Edema   . 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 S/P CABG (coronary artery bypass graft) 2000    Past Surgical History  Procedure Laterality Date  . Coronary artery bypass graft  2000    LIMA to LAD; SVG to intermediate, SVG to PD and RCA per Dr. Servando Snare  . Tubal ligation  1961  . Other surgical history  1999    historectomy  . Cardiac catheterization  1995    dr Oran Rein     Current Outpatient Prescriptions  Medication Sig Dispense Refill  . ALPRAZolam (XANAX) 0.25 MG tablet Take 0.25 mg by mouth every 6 (six) hours as needed for anxiety.    Marland Kitchen ammonium lactate (AMLACTIN) 12 % cream Apply 1 g topically as needed for dry skin.     Marland Kitchen aspirin EC 81 MG tablet Take 81 mg by mouth at bedtime.    Marland Kitchen atorvastatin (LIPITOR) 10 MG tablet TAKE 1 TABLET BY MOUTH EVERY DAY 30 tablet 11  . Calcium-Vitamin D (CALTRATE 600 PLUS-VIT D PO) Take 1 tablet by mouth 2 (two) times daily.      . citalopram (CELEXA) 20 MG tablet Take 1 tablet by mouth Daily.    Marland Kitchen donepezil (ARICEPT) 10 MG tablet Take 10 mg by mouth daily.    . fexofenadine (ALLEGRA) 60 MG tablet Take 60 mg by mouth daily as  needed for allergies or rhinitis.     . furosemide (LASIX) 40 MG tablet Take 1 tablet (40 mg total) by mouth every other day. Alternates with 80 mg by mouth daily 30 tablet 5  . furosemide (LASIX) 80 MG tablet Take 1 tablet (80 mg total) by mouth every other day. ALTERNATING WITH 40 MG TABLET 30 tablet 5  . metFORMIN (GLUCOPHAGE-XR) 500 MG 24 hr tablet Take 1,000 mg by mouth 2 (two) times daily.     . metoprolol tartrate (LOPRESSOR) 25 MG tablet Take 12.5 mg by mouth 2 (two) times daily.    . Misc Natural Products (JOINT SUPPORT PO) Take 1-2 capsules by mouth daily.    . Multiple Vitamins-Minerals (MULTIVITAL PO) Take 1 capsule by mouth daily.     . nitroGLYCERIN (NITROSTAT) 0.4 MG SL tablet Place 0.4 mg under the tongue every 5 (five) minutes as needed  for chest pain ((MAX 3 doses)).    Marland Kitchen pioglitazone (ACTOS) 45 MG tablet TAKE 1/2 TABLET BY MOUTH EVERY DAY 30 tablet 3  . Rivaroxaban (XARELTO) 15 MG TABS tablet Take 1 tablet (15 mg total) by mouth daily with supper. 30 tablet 0  . traMADol (ULTRAM) 50 MG tablet Take 50-100 mg by mouth every 8 (eight) hours as needed for moderate pain.      No current facility-administered medications for this visit.    Allergies:   Aspirin; Dimetapp cold-allergy; Ilosone; Morphine and related; Penicillins; Restoril; and Zolpidem tartrate    Social History:  The patient  reports that she has never smoked. She does not have any smokeless tobacco history on file. She reports that she drinks about 1.8 oz of alcohol per week. She reports that she does not use illicit drugs.   Family History:  The patient's family history includes Heart attack in her mother; Transient ischemic attack in her father.    ROS:  Please see the history of present illness.   Otherwise, review of systems are positive for none.   All other systems are reviewed and negative.    PHYSICAL EXAM: VS:  BP 130/70 mmHg  Pulse 70  Ht 5\' 4"  (1.626 m)  Wt 132 lb 12.8 oz (60.238 kg)  BMI 22.78 kg/m2 , BMI Body mass index is 22.78 kg/(m^2). GEN: Well nourished, well developed, in no acute distress HEENT: normal Neck: no JVD, carotid bruits, or masses Cardiac: Irregularly irregular. no murmurs, rubs, or gallops,no edema  Respiratory:  clear to auscultation bilaterally, normal work of breathing GI: soft, nontender, nondistended, + BS MS: no deformity or atrophy Skin: warm and dry, no rash Neuro:  Strength and sensation are intact Psych: euthymic mood, full affect.She does have problems with her memory   EKG:  EKG is ordered today. The ekg ordered today demonstrates Atrial fibrillation with controlled ventricular response at 70 bpm.  Inferolateral ST-T wave abnormalities consistent with ischemia and/or digitalis effect.  Since prior tracing  of 04/26/15, no significant change   Recent Labs: 03/12/2015: TSH 3.364 04/27/2015: BUN 36*; Creatinine, Ser 1.14*; Hemoglobin 14.3; Platelets 151; Potassium 3.1*; Sodium 138 04/28/2015: Magnesium 1.7    Lipid Panel    Component Value Date/Time   CHOL 208* 10/03/2012 0908   TRIG 83.0 10/03/2012 0908   HDL 67.00 10/03/2012 0908   CHOLHDL 3 10/03/2012 0908   VLDL 16.6 10/03/2012 0908   LDLDIRECT 124.9 10/03/2012 0908      Wt Readings from Last 3 Encounters:  05/14/15 132 lb 12.8 oz (60.238 kg)  04/28/15 139 lb 5.3  oz (63.2 kg)  04/23/15 139 lb 12.8 oz (63.413 kg)        ASSESSMENT AND PLAN:  1. Paroxysmal atrial fibrillation, essentially asymptomatic. She is on long-term Coumadin. 2. Ischemic heart disease status post CABG in 2000. She has rare episodes of angina for which she takes sublingual nitroglycerin. 3. Chronic diastolic heart failure, controlled on medication.Most recent echocardiogram 04/27/15 showed ejection fraction 50-55% with biatrial enlargement. 4. Adult-onset diabetes, no hypoglycemic episodes. 5. Hypercholesterolemia followed by Dr. Dagmar Hait. 6. Recent Syncopal episodes and dehydration, resolved   Current medicines are reviewed at length with the patient today.  The patient does not have concerns regarding medicines.  The following changes have been made:  no change  Labs/ tests ordered today include:   Orders Placed This Encounter  Procedures  . Basic metabolic panel     Disposition:  Continue current medication.After my retirement she will follow-up with Dr. Debara Pickett in 6 months  Signed, Karen Coco MD 05/14/2015 6:08 PM    [address], [place], [place]  [address] Phone: [phone]; Fax: [phone]

## 2015-05-14 NOTE — Patient Instructions (Signed)
Medication Instructions:  Your physician recommends that you continue on your current medications as directed. Please refer to the Current Medication list given to you today.  Labwork: bmet  Testing/Procedures: none  Follow-Up: Your physician wants you to follow-up in: 6 month ov with Dr Debara Pickett  You will receive a reminder letter in the mail two months in advance. If you don't receive a letter, please call our office to schedule the follow-up appointment.  If you need a refill on your cardiac medications before your next appointment, please call your pharmacy.

## 2015-05-21 NOTE — Addendum Note (Signed)
Addended by: Freada Bergeron on: 05/21/2015 11:27 AM   Modules accepted: Orders

## 2015-06-05 ENCOUNTER — Ambulatory Visit (INDEPENDENT_AMBULATORY_CARE_PROVIDER_SITE_OTHER): Payer: Medicare Other | Admitting: Neurology

## 2015-06-05 ENCOUNTER — Encounter: Payer: Self-pay | Admitting: Neurology

## 2015-06-05 VITALS — BP 106/66 | HR 84 | Resp 14 | Ht 64.0 in | Wt 137.0 lb

## 2015-06-05 DIAGNOSIS — E119 Type 2 diabetes mellitus without complications: Secondary | ICD-10-CM | POA: Diagnosis not present

## 2015-06-05 DIAGNOSIS — I482 Chronic atrial fibrillation, unspecified: Secondary | ICD-10-CM

## 2015-06-05 DIAGNOSIS — E785 Hyperlipidemia, unspecified: Secondary | ICD-10-CM | POA: Diagnosis not present

## 2015-06-05 DIAGNOSIS — G3184 Mild cognitive impairment, so stated: Secondary | ICD-10-CM

## 2015-06-05 DIAGNOSIS — I10 Essential (primary) hypertension: Secondary | ICD-10-CM | POA: Insufficient documentation

## 2015-06-05 MED ORDER — DONEPEZIL HCL 10 MG PO TABS: 10.0000 mg | ORAL_TABLET | Freq: Every day | ORAL | Status: AC

## 2015-06-05 NOTE — Patient Instructions (Addendum)
1. Restart Aricept 10mg  daily 2. Control of blood pressure, cholesterol, diabetes, as well as physical exercise, brain stimulation exercises, and the Mediterranean diet, have been found to be helpful for brain health 3. Follow-up in 1 year, call for any problems

## 2015-06-05 NOTE — Progress Notes (Signed)
NEUROLOGY CONSULTATION NOTE  Karen Arroyo MRN: [id number] DOB: Mar 30, 1934  Referring provider: Truitt Merle, NP Primary care provider: Dr. Prince Solian  Reason for consult: memory loss  Thank you for your kind referral of Karen Arroyo for consultation of the above symptoms. Although her history is well known to you, please allow me to reiterate it for the purpose of our medical record. The patient was accompanied to the clinic by her husband who also provides collateral information. Records and images were personally reviewed where available.  HISTORY OF PRESENT ILLNESS: This is a pleasant 82 year old right-handed woman with a history of hypertension, hyperlipidemia, diabetes, CAD s/p CABG, atrial fibrillation on anticoagulation, who was admitted to [facility] last 04/26/15 for syncope. Reason for consult listed is for syncope, however on review of records and patient/husband report, syncopal episodes in January appear to have been related to orthostatic hypotension, BP medications were adjusted, and she denies any further similar symptoms since then. On further review of records, it appears she is here today for memory loss noted in her cardiologist office last December 2016. Per office notes, she was alone and was asking medicine for her memory. History was pretty unclear at that time, she was only able to tell her NP that she had passed out in 2023/03/03 but does not recall much. She was continuously asking about her Aricept. Her last several INRs were very subtherapeutic, she admitted she probably misses some of her medications.   She reports her memory is "not what it used to be." Her husband stated noticing changes around 2-3 years ago. She states she "forgets everything." Her husband reports that she frequently says "I don't remember that." She misplaces things frequently. Her husband fills her pillbox every week, and does notice that she would forget a dose maybe once a week. She does not drive  due to macular degeneration. Her husband has always been in charge of bill payments. He has been cooking for them for the past 10 years, they deny leaving the stove on or burning anything. They have a cleaning lady who comes once a month, per husband hygiene is good, no difficulties with dressing/bathing independently. She had been taking Aricept, but her husband states he has not seen the Aricept bottle recently when he fills her pillbox, she may have forgotten to renew it.   She has macular degeneration in both eyes, right>left. She reports occasional left hand tremors. Otherwise she denies any headaches, dizziness, diplopia, dysarthria,dysphagia, neck/back pain, bowel/bladder dysfunction, anosmia. No family history of dementia. She denies any history of head injuries. She drinks a glass of wine nearly every afternoon.   I personally reviewed head CT without contrast done 03/12/15 which did not show any acute changes. There was mild diffuse atrophy with ventricular dilatation, chronic microvascular disease. Lab Results  Component Value Date   WBC 7.9 04/27/2015   HGB 14.3 04/27/2015   HCT 41.2 04/27/2015   MCV 86.2 04/27/2015   PLT 151 04/27/2015     Chemistry      Component Value Date/Time   NA 138 04/27/2015 0554   K 3.1* 04/27/2015 0554   CL 97* 04/27/2015 0554   CO2 26 04/27/2015 0554   BUN 36* 04/27/2015 0554   CREATININE 1.14* 04/27/2015 0554   CREATININE 0.75 03/12/2015 1223      Component Value Date/Time   CALCIUM 8.9 04/27/2015 0554   ALKPHOS 64 10/03/2012 0908   AST 17 10/03/2012 0908   ALT 11 10/03/2012 0908  BILITOT 0.7 10/03/2012 0908     Lab Results  Component Value Date   TSH 3.364 03/12/2015     PAST MEDICAL HISTORY: Past Medical History  Diagnosis Date  . HX: long term anticoagulant use   . PAF (paroxysmal atrial fibrillation) ([place])   . Coronary artery disease   . Diabetes mellitus   . Dyslipidemia   . Pulmonary hypertension ([place])   . Diastolic  dysfunction   . Skin cancer     nose, leg  . Edema   . MI, old 87    involving small LCX  . S/P CABG (coronary artery bypass graft) 2000    PAST SURGICAL HISTORY: Past Surgical History  Procedure Laterality Date  . Coronary artery bypass graft  2000    LIMA to LAD; SVG to intermediate, SVG to PD and RCA per Dr. Servando Snare  . Tubal ligation  1961  . Other surgical history  1999    historectomy  . Cardiac catheterization  1995    dr Oran Rein    MEDICATIONS: Current Outpatient Prescriptions on File Prior to Visit  Medication Sig Dispense Refill  . Rivaroxaban (XARELTO) 15 MG TABS tablet Take 1 tablet (15 mg total) by mouth daily with supper. 30 tablet 0  . ALPRAZolam (XANAX) 0.25 MG tablet Take 0.25 mg by mouth every 6 (six) hours as needed for anxiety.    Marland Kitchen ammonium lactate (AMLACTIN) 12 % cream Apply 1 g topically as needed for dry skin.     Marland Kitchen aspirin EC 81 MG tablet Take 81 mg by mouth at bedtime.    Marland Kitchen atorvastatin (LIPITOR) 10 MG tablet TAKE 1 TABLET BY MOUTH EVERY DAY 30 tablet 11  . Calcium-Vitamin D (CALTRATE 600 PLUS-VIT D PO) Take 1 tablet by mouth 2 (two) times daily.      . citalopram (CELEXA) 20 MG tablet Take 1 tablet by mouth Daily.    Marland Kitchen donepezil (ARICEPT) 10 MG tablet Take 10 mg by mouth daily.    . fexofenadine (ALLEGRA) 60 MG tablet Take 60 mg by mouth daily as needed for allergies or rhinitis.     . furosemide (LASIX) 40 MG tablet Take 1 tablet (40 mg total) by mouth every other day. Alternates with 80 mg by mouth daily 30 tablet 5  . furosemide (LASIX) 80 MG tablet Take 1 tablet (80 mg total) by mouth every other day. ALTERNATING WITH 40 MG TABLET 30 tablet 5  . metFORMIN (GLUCOPHAGE-XR) 500 MG 24 hr tablet Take 1,000 mg by mouth 2 (two) times daily.     . metoprolol tartrate (LOPRESSOR) 25 MG tablet Take 12.5 mg by mouth 2 (two) times daily.    . Misc Natural Products (JOINT SUPPORT PO) Take 1-2 capsules by mouth daily.    . Multiple Vitamins-Minerals (MULTIVITAL  PO) Take 1 capsule by mouth daily.     . nitroGLYCERIN (NITROSTAT) 0.4 MG SL tablet Place 0.4 mg under the tongue every 5 (five) minutes as needed for chest pain ((MAX 3 doses)). Reported on 06/05/2015    . pioglitazone (ACTOS) 45 MG tablet TAKE 1/2 TABLET BY MOUTH EVERY DAY 30 tablet 3  . traMADol (ULTRAM) 50 MG tablet Take 50-100 mg by mouth every 8 (eight) hours as needed for moderate pain.      No current facility-administered medications on file prior to visit.    ALLERGIES: Allergies  Allergen Reactions  . Aspirin     Rash - only in high doses  . Dimetapp Cold-Allergy  Rash   . Ilosone [Erythromycin Estolate]     rash  . Morphine And Related     rash  . Penicillins     rash  . Restoril     rash  . Zolpidem Tartrate     rash    FAMILY HISTORY: Family History  Problem Relation Age of Onset  . Heart attack Mother   . Transient ischemic attack Father     SOCIAL HISTORY: Social History   Social History  . Marital Status: Married    Spouse Name: N/A  . Number of Children: 3  . Years of Education: N/A   Occupational History  .     Social History Main Topics  . Smoking status: Never Smoker   . Smokeless tobacco: Not on file  . Alcohol Use: 1.8 oz/week    3 Glasses of wine per week  . Drug Use: No  . Sexual Activity: Not on file   Other Topics Concern  . Not on file   Social History Narrative    REVIEW OF SYSTEMS: Constitutional: No fevers, chills, or sweats, no generalized fatigue, change in appetite Eyes: No visual changes, double vision, eye pain Ear, nose and throat: No hearing loss, ear pain, nasal congestion, sore throat Cardiovascular: No chest pain, palpitations Respiratory:  No shortness of breath at rest or with exertion, wheezes GastrointestinaI: No nausea, vomiting, diarrhea, abdominal pain, fecal incontinence Genitourinary:  No dysuria, urinary retention or frequency Musculoskeletal:  No neck pain, back pain Integumentary: No rash,  pruritus, skin lesions Neurological: as above Psychiatric: No depression, insomnia, anxiety Endocrine: No palpitations, fatigue, diaphoresis, mood swings, change in appetite, change in weight, increased thirst Hematologic/Lymphatic:  No anemia, purpura, petechiae. Allergic/Immunologic: no itchy/runny eyes, nasal congestion, recent allergic reactions, rashes  PHYSICAL EXAM: Filed Vitals:   06/05/15 1043  BP: 106/66  Pulse: 84  Resp: 14   General: No acute distress Head:  Normocephalic/atraumatic Eyes: Fundoscopic exam shows bilateral sharp discs, no vessel changes, exudates, or hemorrhages Neck: supple, no paraspinal tenderness, full range of motion Back: No paraspinal tenderness Heart: irregular rhythm Lungs: Clear to auscultation bilaterally. Vascular: No carotid bruits. Skin/Extremities: No rash, no edema Neurological Exam: Mental status: alert and oriented to person, place, month/season (states it is Friday - today is Thursday, did not know year), no dysarthria or aphasia, Fund of knowledge is appropriate.  Remote memory intact.  Attention and concentration are normal.    Able to name objects and repeat phrases. CDT 5/5 MMSE - Mini Mental State Exam 06/05/2015  Orientation to time 2  Orientation to Place 5  Registration 3  Attention/ Calculation 5  Recall 0  Language- name 2 objects 2  Language- repeat 1  Language- follow 3 step command 3  Language- read & follow direction 1  Write a sentence 1  Copy design 1  Total score 24   Cranial nerves: CN I: not tested CN II: pupils equal, round and reactive to light, visual fields intact, fundi unremarkable. CN III, IV, VI:  full range of motion, no nystagmus, no ptosis CN V: facial sensation intact CN VII: upper and lower face symmetric CN VIII: hearing intact to finger rub CN IX, X: gag intact, uvula midline CN XI: sternocleidomastoid and trapezius muscles intact CN XII: tongue midline Bulk & Tone: normal, no  fasciculations, no cogwheeling Motor: 5/5 throughout with no pronator drift. Sensation: decreased pin on left UE, decreased vibration to right ankle, otherwise intact to light touch, cold,and joint position sense.  No extinction to double simultaneous stimulation.  Romberg test negative Deep Tendon Reflexes: +1 throughout, no ankle clonus Plantar responses: downgoing bilaterally Cerebellar: no incoordination on finger to nose, heel to shin. No dysdiadochokinesia Gait: narrow-based and steady, able to tandem walk adequately. Tremor: none today  IMPRESSION: This is a pleasant 82 year old right-handed woman with a history of hypertension, hyperlipidemia, diabetes, CAD, atrial fibrillation on anticoagulation, presenting for worsening memory. Her neurological exam is overall unremarkable, MMSE today is 24/30, indicating mild cognitive impairment. She is able to performs ADLs independently overall, she does not drive due to vision problems. Head CT unremarkable. She will restart Aricept 10mg  daily, side effects were discussed. We discussed the importance of control of vascular risk factors, physical exercise, and brain stimulation exercises for brain health. She will follow-up in 1 year and knows to call for any changes.   Thank you for allowing me to participate in the care of this patient. Please do not hesitate to call for any questions or concerns.   Ellouise Newer, M.D.  CC: Dr. Dagmar Hait, Truitt Merle

## 2015-07-01 NOTE — Progress Notes (Signed)
This encounter was created in error - please disregard.

## 2015-11-06 ENCOUNTER — Telehealth: Payer: Self-pay | Admitting: Internal Medicine

## 2015-11-06 NOTE — Telephone Encounter (Signed)
Closed encounter °

## 2015-12-02 ENCOUNTER — Ambulatory Visit: Payer: Medicare Other | Admitting: Internal Medicine

## 2015-12-11 ENCOUNTER — Ambulatory Visit: Payer: Medicare Other | Admitting: Internal Medicine

## 2016-01-05 ENCOUNTER — Ambulatory Visit (INDEPENDENT_AMBULATORY_CARE_PROVIDER_SITE_OTHER): Payer: Medicare Other | Admitting: Internal Medicine

## 2016-01-05 ENCOUNTER — Encounter: Payer: Self-pay | Admitting: Internal Medicine

## 2016-01-05 VITALS — BP 128/86 | HR 80 | Ht 64.0 in | Wt 141.0 lb

## 2016-01-05 DIAGNOSIS — I2510 Atherosclerotic heart disease of native coronary artery without angina pectoris: Secondary | ICD-10-CM | POA: Diagnosis not present

## 2016-01-05 DIAGNOSIS — I10 Essential (primary) hypertension: Secondary | ICD-10-CM | POA: Diagnosis not present

## 2016-01-05 DIAGNOSIS — Z951 Presence of aortocoronary bypass graft: Secondary | ICD-10-CM

## 2016-01-05 DIAGNOSIS — E785 Hyperlipidemia, unspecified: Secondary | ICD-10-CM

## 2016-01-05 DIAGNOSIS — I480 Paroxysmal atrial fibrillation: Secondary | ICD-10-CM

## 2016-01-05 NOTE — Patient Instructions (Signed)
Medication Instructions:  Your physician recommends that you continue on your current medications as directed. Please refer to the Current Medication list given to you today.  Labwork: None   Testing/Procedures: None   Follow-Up: Your physician wants you to follow-up in: 6 months with Dr Hilty. You will receive a reminder letter in the mail two months in advance. If you don't receive a letter, please call our office to schedule the follow-up appointment.  Any Other Special Instructions Will Be Listed Below (If Applicable).     If you need a refill on your cardiac medications before your next appointment, please call your pharmacy.  

## 2016-01-07 DIAGNOSIS — Z951 Presence of aortocoronary bypass graft: Secondary | ICD-10-CM | POA: Insufficient documentation

## 2016-01-07 NOTE — Progress Notes (Signed)
OFFICE NOTE  Chief Complaint:  Establish cardiologist  Primary Care Physician: Tivis Ringer, MD  HPI:  Karen Arroyo is a 82 y.o. female who was formerly followed by Dr. Mare Ferrari and is now a patient of mine after his retirement. As a history of remote coronary artery bypass grafting in 2000 as well as a history of prior MI in 1995 involving a small circumflex vessel. She also has PAF, type 2 diabetes, diastolic dysfunction, dyslipidemia and is on attack coagulation with Xarelto. Overall she seems to be doing quite well. Her blood pressure is well-controlled today. She is on Lasix however recently her dose was reduced to 20 mg daily. She maintained A. fib with a heart rate of 80 and is noted to have PVCs on her EKG today. She denies any chest pain or worsening shortness of breath. Does have chronic lower extremity edema as well as varicose veins. There is also significant memory loss.  PMHx:  Past Medical History:  Diagnosis Date  . Coronary artery disease   . Diabetes mellitus   . Diastolic dysfunction   . Dyslipidemia   . Edema   . HX: long term anticoagulant use   . MI, old 18   involving small LCX  . PAF (paroxysmal atrial fibrillation) ([place])   . Pulmonary hypertension   . S/P CABG (coronary artery bypass graft) 2000  . Skin cancer    nose, leg    Past Surgical History:  Procedure Laterality Date  . CARDIAC CATHETERIZATION  1995   dr Oran Rein  . CORONARY ARTERY BYPASS GRAFT  2000   LIMA to LAD; SVG to intermediate, SVG to PD and RCA per Dr. Servando Snare  . OTHER SURGICAL HISTORY  1999   historectomy  . TUBAL LIGATION  1961    FAMHx:  Family History  Problem Relation Age of Onset  . Heart attack Mother   . Transient ischemic attack Father     SOCHx:   reports that she has never smoked. She does not have any smokeless tobacco history on file. She reports that she drinks about 1.8 oz of alcohol per week . She reports that she does not use drugs.  ALLERGIES:    Allergies  Allergen Reactions  . Aspirin     Rash - only in high doses  . Dimetapp Cold-Allergy     Rash   . Ilosone [Erythromycin Estolate]     rash  . Morphine And Related     rash  . Penicillins     rash  . Restoril     rash  . Zolpidem Tartrate     rash    ROS: Pertinent items are noted in HPI. Pertinent items noted in HPI and remainder of comprehensive ROS otherwise negative.  HOME MEDS: Current Outpatient Prescriptions on File Prior to Visit  Medication Sig Dispense Refill  . ALPRAZolam (XANAX) 0.25 MG tablet Take 0.25 mg by mouth every 6 (six) hours as needed for anxiety.    Marland Kitchen ammonium lactate (AMLACTIN) 12 % cream Apply 1 g topically as needed for dry skin.     Marland Kitchen aspirin EC 81 MG tablet Take 81 mg by mouth at bedtime.    Marland Kitchen atorvastatin (LIPITOR) 10 MG tablet TAKE 1 TABLET BY MOUTH EVERY DAY 30 tablet 11  . Calcium-Vitamin D (CALTRATE 600 PLUS-VIT D PO) Take 1 tablet by mouth 2 (two) times daily.      . citalopram (CELEXA) 20 MG tablet Take 1 tablet by mouth Daily.    Marland Kitchen  donepezil (ARICEPT) 10 MG tablet Take 1 tablet (10 mg total) by mouth daily. 90 tablet 3  . fexofenadine (ALLEGRA) 60 MG tablet Take 60 mg by mouth daily as needed for allergies or rhinitis.     . furosemide (LASIX) 40 MG tablet Take 1 tablet (40 mg total) by mouth every other day. Alternates with 80 mg by mouth daily (Patient taking differently: Take 1/2 tablet daily) 30 tablet 5  . metFORMIN (GLUCOPHAGE-XR) 500 MG 24 hr tablet Take 1,000 mg by mouth 2 (two) times daily.     . metoprolol tartrate (LOPRESSOR) 25 MG tablet Take 12.5 mg by mouth 2 (two) times daily.    . Misc Natural Products (JOINT SUPPORT PO) Take 1-2 capsules by mouth daily.    . Multiple Vitamins-Minerals (MULTIVITAL PO) Take 1 capsule by mouth daily.     . nitroGLYCERIN (NITROSTAT) 0.4 MG SL tablet Place 0.4 mg under the tongue every 5 (five) minutes as needed for chest pain ((MAX 3 doses)). Reported on 06/05/2015    . pioglitazone  (ACTOS) 45 MG tablet TAKE 1/2 TABLET BY MOUTH EVERY DAY 30 tablet 3  . Rivaroxaban (XARELTO) 15 MG TABS tablet Take 1 tablet (15 mg total) by mouth daily with supper. 30 tablet 0  . traMADol (ULTRAM) 50 MG tablet Take 50-100 mg by mouth every 8 (eight) hours as needed for moderate pain.      No current facility-administered medications on file prior to visit.     LABS/IMAGING: No results found for this or any previous visit (from the past 48 hour(s)). No results found.  WEIGHTS: Wt Readings from Last 3 Encounters:  01/05/16 141 lb (64 kg)  06/05/15 137 lb (62.1 kg)  05/14/15 132 lb 12.8 oz (60.2 kg)    VITALS: BP 128/86   Pulse 80   Ht 5\' 4"  (1.626 m)   Wt 141 lb (64 kg)   BMI 24.20 kg/m   EXAM: General appearance: alert and no distress Neck: no carotid bruit and no JVD Lungs: clear to auscultation bilaterally Heart: regular rate and rhythm, S1, S2 normal, no murmur, click, rub or gallop Abdomen: soft, non-tender; bowel sounds normal; no masses,  no organomegaly Extremities: extremities normal, atraumatic, no cyanosis or edema Pulses: 2+ and symmetric Skin: Skin color, texture, turgor normal. No rashes or lesions Neurologic: Grossly normal Psych: Pleasant  EKG: Atrial fibrillation at 82 with a PVC  ASSESSMENT: 1. Coronary artery disease status post CABG in 2000 2. History of circumflex territory MI 63 3. PAF on Xarelto - CHADSVASC 5 4. DM2 5. HTN 6. HPL  PLAN: 1.   Mrs. Jo appears stable and has no coronary complaints. Her bypass grafts are now quite old however she seems to be doing well. She is in persistent if not permanent A. fib and is tolerating Xarelto. Her CHADSVASC score is 5. Blood pressure is at goal and cholesterol has been recently assessed and she is on atorvastatin.  Follow-up with me in 6 months or sooner as necessary.  Pixie Casino, MD, [facility] Attending Cardiologist [place] 01/07/2016, 5:54 PM

## 2016-02-19 ENCOUNTER — Other Ambulatory Visit: Payer: Self-pay

## 2016-02-19 ENCOUNTER — Emergency Department (HOSPITAL_COMMUNITY)
Admission: EM | Admit: 2016-02-19 | Discharge: 2016-02-20 | Disposition: A | Discharge status: Home / Self Care | Payer: Medicare Other | Attending: Emergency Medicine | Admitting: Emergency Medicine

## 2016-02-19 ENCOUNTER — Emergency Department (HOSPITAL_COMMUNITY): Payer: Medicare Other

## 2016-02-19 ENCOUNTER — Encounter (HOSPITAL_COMMUNITY): Payer: Self-pay | Admitting: Emergency Medicine

## 2016-02-19 DIAGNOSIS — I2510 Atherosclerotic heart disease of native coronary artery without angina pectoris: Secondary | ICD-10-CM | POA: Diagnosis not present

## 2016-02-19 DIAGNOSIS — Z7901 Long term (current) use of anticoagulants: Secondary | ICD-10-CM | POA: Insufficient documentation

## 2016-02-19 DIAGNOSIS — Y999 Unspecified external cause status: Secondary | ICD-10-CM | POA: Diagnosis not present

## 2016-02-19 DIAGNOSIS — S3992XA Unspecified injury of lower back, initial encounter: Secondary | ICD-10-CM | POA: Diagnosis present

## 2016-02-19 DIAGNOSIS — W228XXA Striking against or struck by other objects, initial encounter: Secondary | ICD-10-CM | POA: Insufficient documentation

## 2016-02-19 DIAGNOSIS — S39012A Strain of muscle, fascia and tendon of lower back, initial encounter: Secondary | ICD-10-CM | POA: Diagnosis not present

## 2016-02-19 DIAGNOSIS — I10 Essential (primary) hypertension: Secondary | ICD-10-CM | POA: Diagnosis not present

## 2016-02-19 DIAGNOSIS — Y92002 Bathroom of unspecified non-institutional (private) residence single-family (private) house as the place of occurrence of the external cause: Secondary | ICD-10-CM | POA: Diagnosis not present

## 2016-02-19 DIAGNOSIS — E119 Type 2 diabetes mellitus without complications: Secondary | ICD-10-CM | POA: Insufficient documentation

## 2016-02-19 DIAGNOSIS — R55 Syncope and collapse: Secondary | ICD-10-CM

## 2016-02-19 DIAGNOSIS — Z7982 Long term (current) use of aspirin: Secondary | ICD-10-CM | POA: Diagnosis not present

## 2016-02-19 DIAGNOSIS — Z85828 Personal history of other malignant neoplasm of skin: Secondary | ICD-10-CM | POA: Insufficient documentation

## 2016-02-19 DIAGNOSIS — Z951 Presence of aortocoronary bypass graft: Secondary | ICD-10-CM | POA: Diagnosis not present

## 2016-02-19 DIAGNOSIS — I252 Old myocardial infarction: Secondary | ICD-10-CM | POA: Insufficient documentation

## 2016-02-19 DIAGNOSIS — R42 Dizziness and giddiness: Secondary | ICD-10-CM | POA: Insufficient documentation

## 2016-02-19 DIAGNOSIS — Y939 Activity, unspecified: Secondary | ICD-10-CM | POA: Insufficient documentation

## 2016-02-19 LAB — BASIC METABOLIC PANEL
ANION GAP: 10 (ref 5–15)
BUN: 10 mg/dL (ref 6–20)
CALCIUM: 9.7 mg/dL (ref 8.9–10.3)
CO2: 26 mmol/L (ref 22–32)
CREATININE: 0.81 mg/dL (ref 0.44–1.00)
Chloride: 101 mmol/L (ref 101–111)
Glucose, Bld: 199 mg/dL — ABNORMAL HIGH (ref 65–99)
Potassium: 3.7 mmol/L (ref 3.5–5.1)
SODIUM: 137 mmol/L (ref 135–145)

## 2016-02-19 LAB — CBC
HCT: 41.8 % (ref 36.0–46.0)
HEMOGLOBIN: 14.2 g/dL (ref 12.0–15.0)
MCH: 29.4 pg (ref 26.0–34.0)
MCHC: 34 g/dL (ref 30.0–36.0)
MCV: 86.5 fL (ref 78.0–100.0)
PLATELETS: 180 10*3/uL (ref 150–400)
RBC: 4.83 MIL/uL (ref 3.87–5.11)
RDW: 13.7 % (ref 11.5–15.5)
WBC: 9.1 10*3/uL (ref 4.0–10.5)

## 2016-02-19 LAB — URINE MICROSCOPIC-ADD ON

## 2016-02-19 LAB — URINALYSIS, ROUTINE W REFLEX MICROSCOPIC
BILIRUBIN URINE: NEGATIVE
Glucose, UA: NEGATIVE mg/dL
HGB URINE DIPSTICK: NEGATIVE
Ketones, ur: 15 mg/dL — AB
Leukocytes, UA: NEGATIVE
NITRITE: NEGATIVE
PROTEIN: 30 mg/dL — AB
SPECIFIC GRAVITY, URINE: 1.016 (ref 1.005–1.030)
pH: 8 (ref 5.0–8.0)

## 2016-02-19 MED ORDER — METHOCARBAMOL 500 MG PO TABS: 500.0000 mg | ORAL_TABLET | Freq: Three times a day (TID) | ORAL | 0 refills | Status: DC | PRN

## 2016-02-19 MED ORDER — METHOCARBAMOL 500 MG PO TABS
500.0000 mg | ORAL_TABLET | Freq: Once | ORAL | Status: AC
  Administered 2016-02-19: 500 mg via ORAL
  Filled 2016-02-19: qty 1

## 2016-02-19 MED ORDER — ACETAMINOPHEN 500 MG PO TABS
1000.0000 mg | ORAL_TABLET | Freq: Once | ORAL | Status: AC
  Administered 2016-02-19: 1000 mg via ORAL
  Filled 2016-02-19: qty 2

## 2016-02-19 NOTE — ED Provider Notes (Signed)
I saw and evaluated the patient, reviewed the resident's note and I agree with the findings and plan.   EKG Interpretation  Date/Time:  Thursday February 19 2016 20:59:32 EST Ventricular Rate:  90 PR Interval:    QRS Duration: 98 QT Interval:  398 QTC Calculation: 486 R Axis:   97 Text Interpretation:  Undetermined rhythm Rightward axis Incomplete right bundle branch block ST & T wave abnormality, consider inferolateral ischemia Prolonged QT Abnormal ECG Confirmed by Edd Reppert  MD, Jackalyn Haith ([id number]) on 02/19/2016 11:03:59 PM     82 year old female who had a syncopal event prior to arrival. She did injure her head when she fell. Has had similar symptoms in the past. Patient feels back to her baseline. Family states they feel comfortable taking her home and will follow-up with her Dr.   Lacretia Leigh, MD 02/19/16 2328

## 2016-02-19 NOTE — ED Provider Notes (Signed)
Karen Arroyo Provider Note   CSN: [id number] Arrival date & time: 02/19/16  2047     History   Chief Complaint Chief Complaint  Patient presents with  . Fall  . Loss of Consciousness  . Back Pain    HPI Karen Arroyo is a 82 y.o. female  With a hx of PAF on xarelto, CAD, s/p CABG, with a history of intermittent syncope secondary to autonomic dysfunction with position changes, last episode of which was in January 2017, presents to the ED with her family after she had a syncopal episode today around mid morning. She states that she was in bed and she felt the sudden urge to urinate and instead of sitting up slowly and waiting for her equillibrium to adjust, she stood straight up and had a syncopal episode on the floor. She struck her head against the floor. She was found just about a minute or two later and awoke without difficulty and returned to her normal neurologic baseline. She had no observed seizure activity. The patient has a history of identical episodes. She has since been going about her normal activities today for the last few hours, but has noted the insidious worsening of muscle pain in her low back. She denies any neck, head, or midline back pain. She states that she was feeling fine, in her normal state of health prior to the syncope. She has a hx of overactive bladder and take myrbetriq. She denies any dysuria, fever, chills, nausea, vomiting, abdominal pain, chest pain, palpitations, chest tightness or SOB.   HPI  Past Medical History:  Diagnosis Date  . Coronary artery disease   . Diabetes mellitus   . Diastolic dysfunction   . Dyslipidemia   . Edema   . HX: long term anticoagulant use   . 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 PAF (paroxysmal atrial fibrillation) ([place])   . Pulmonary hypertension   . S/P CABG (coronary artery bypass graft) 2000  . Skin cancer    nose, leg    Patient Active Problem List   Diagnosis Date Noted  . Hx of CABG 01/07/2016  .  Mild cognitive impairment 06/05/2015  . Essential hypertension 06/05/2015  . Hyperlipidemia 06/05/2015  . Type 2 diabetes mellitus without complication, without long-term current use of insulin ([place]) 06/05/2015  . Syncope and collapse   . AKI (acute kidney injury) ([place]) 04/26/2015  . Syncope 04/26/2015  . PAF (paroxysmal atrial fibrillation) ([place])   . Diabetes ([place])   . Encounter for therapeutic drug monitoring 05/02/2013  . Malaise and fatigue 12/14/2010  . Chest pain 11/25/2010  . Atrial fibrillation ([place])   . Coronary artery disease   . Long term (current) use of anticoagulants   . Type II or unspecified type diabetes mellitus without mention of complication, uncontrolled   . Dyslipidemia   . Ischemia of heart, chronic   . Diastolic dysfunction   . Pulmonary hypertension ([place])     Past Surgical History:  Procedure Laterality Date  . CARDIAC CATHETERIZATION  1995   dr Oran Rein  . CORONARY ARTERY BYPASS GRAFT  2000   LIMA to LAD; SVG to intermediate, SVG to PD and RCA per Dr. Servando Snare  . OTHER SURGICAL HISTORY  1999   historectomy  . TUBAL LIGATION  1961    OB History    No data available       Home Medications    Prior to Admission medications   Medication Sig Start Date End  Date Taking? Authorizing Provider  ammonium lactate (AMLACTIN) 12 % cream Apply 1 g topically as needed for dry skin.  11/01/11  Yes Historical Provider, MD  aspirin EC 81 MG tablet Take 81 mg by mouth at bedtime.   Yes Historical Provider, MD  atorvastatin (LIPITOR) 10 MG tablet TAKE 1 TABLET BY MOUTH EVERY DAY 12/18/14  Yes Darlin Coco, MD  Calcium-Vitamin D (CALTRATE 600 PLUS-VIT D PO) Take 1 tablet by mouth daily.    Yes Historical Provider, MD  Cholecalciferol (VITAMIN D3) 2000 units TABS Take 1 tablet by mouth at bedtime.   Yes Historical Provider, MD  citalopram (CELEXA) 20 MG tablet Take 1 tablet by mouth Daily. 10/23/10  Yes Historical Provider, MD  donepezil (ARICEPT) 10 MG tablet Take  1 tablet (10 mg total) by mouth daily. 06/05/15  Yes Cameron Sprang, MD  fexofenadine (ALLEGRA) 60 MG tablet Take 60 mg by mouth daily as needed for allergies or rhinitis.    Yes Historical Provider, MD  furosemide (LASIX) 40 MG tablet Take 1 tablet (40 mg total) by mouth every other day. Alternates with 80 mg by mouth daily Patient taking differently: Take 1/2 tablet daily 10/03/14  Yes Darlin Coco, MD  Loperamide HCl (IMODIUM PO) Take 1 tablet by mouth at bedtime.   Yes Historical Provider, MD  metoprolol tartrate (LOPRESSOR) 25 MG tablet Take 12.5 mg by mouth 2 (two) times daily.   Yes Historical Provider, MD  mirabegron ER (MYRBETRIQ) 25 MG TB24 tablet Take 25 mg by mouth daily.   Yes Historical Provider, MD  Misc Natural Products (JOINT SUPPORT PO) Take 1-2 capsules by mouth daily.   Yes Historical Provider, MD  Multiple Vitamins-Minerals (MULTIVITAL PO) Take 1 capsule by mouth daily.    Yes Historical Provider, MD  nitroGLYCERIN (NITROSTAT) 0.4 MG SL tablet Place 0.4 mg under the tongue every 5 (five) minutes as needed for chest pain ((MAX 3 doses)). Reported on 06/05/2015   Yes Historical Provider, MD  potassium chloride (K-DUR) 10 MEQ tablet Take 10 mEq by mouth daily.   Yes Historical Provider, MD  Probiotic Product (ALIGN) 4 MG CAPS Take 1 capsule by mouth daily.   Yes Historical Provider, MD  Rivaroxaban (XARELTO) 15 MG TABS tablet Take 1 tablet (15 mg total) by mouth daily with supper. 04/28/15  Yes [name], MD  methocarbamol (ROBAXIN) 500 MG tablet Take 1 tablet (500 mg total) by mouth every 8 (eight) hours as needed for muscle spasms. 02/19/16   Zenovia Jarred, DO  pioglitazone (ACTOS) 45 MG tablet TAKE 1/2 TABLET BY MOUTH EVERY DAY Patient not taking: Reported on 02/19/2016 12/22/11   Darlin Coco, MD    Family History Family History  Problem Relation Age of Onset  . Heart attack Mother   . Transient ischemic attack Father     Social History Social History  Substance Use  Topics  . Smoking status: Never Smoker  . Smokeless tobacco: Not on file  . Alcohol use 1.8 oz/week    3 Glasses of wine per week     Allergies   Aspirin; Dimetapp cold-allergy; Ilosone [erythromycin estolate]; Morphine and related; Penicillins; Restoril; and Zolpidem tartrate   Review of Systems Review of Systems  Constitutional: Negative for chills and fever.  HENT: Negative for congestion, rhinorrhea, sinus pressure and sneezing.   Eyes: Negative for visual disturbance.  Respiratory: Negative for cough, chest tightness and shortness of breath.   Cardiovascular: Negative for chest pain and palpitations.  Gastrointestinal: Negative for  abdominal pain, blood in stool, diarrhea, nausea and vomiting.  Genitourinary: Positive for frequency and urgency. Negative for difficulty urinating, dysuria, flank pain, hematuria and pelvic pain.  Musculoskeletal: Positive for back pain. Negative for arthralgias, myalgias, neck pain and neck stiffness.  Skin: Negative for wound.  Neurological: Positive for syncope. Negative for dizziness, seizures, facial asymmetry, speech difficulty, weakness, light-headedness, numbness and headaches.  All other systems reviewed and are negative.    Physical Exam Updated Vital Signs BP (!) 173/119   Pulse 82   Temp 97.4 F (36.3 C)   Resp 20 Comment: Simultaneous filing. User may not have seen previous data.  SpO2 99%   Physical Exam  Constitutional: She is oriented to person, place, and time. She appears well-developed and well-nourished. No distress.  HENT:  Head: Normocephalic and atraumatic.  Right Ear: External ear normal.  Left Ear: External ear normal.  Nose: Nose normal.  Mouth/Throat: Oropharynx is clear and moist.  Eyes: Conjunctivae and EOM are normal. Pupils are equal, round, and reactive to light.  Neck: Normal range of motion. Neck supple.  Cardiovascular: Normal rate, regular rhythm, normal heart sounds and intact distal pulses.     Pulmonary/Chest: Effort normal and breath sounds normal. She exhibits no tenderness.  Abdominal: Soft. She exhibits no distension. There is no tenderness.  Musculoskeletal: She exhibits tenderness. She exhibits no edema or deformity.       Lumbar back: She exhibits tenderness, pain and spasm. She exhibits no bony tenderness, no swelling, no deformity and no laceration.       Back:  No midline C/T/L spine tenderness or deformity. FROM of b/l hips with no crepitus or tenderness noted. Pelvis stable to anterior and lateral compression. Chest stable and nontender. No extremity injuries.   Neurological: She is alert and oriented to person, place, and time. She has normal strength. No cranial nerve deficit or sensory deficit. She exhibits normal muscle tone. Coordination normal. GCS eye subscore is 4. GCS verbal subscore is 5. GCS motor subscore is 6.  Intact finger to nose, and heel to shin, no pronator drift  Skin: Skin is warm and dry. She is not diaphoretic.  Nursing note and vitals reviewed.    ED Treatments / Results  Labs (all labs ordered are listed, but only abnormal results are displayed) Labs Reviewed  BASIC METABOLIC PANEL - Abnormal; Notable for the following:       Result Value   Glucose, Bld 199 (*)    All other components within normal limits  URINALYSIS, ROUTINE W REFLEX MICROSCOPIC (NOT AT [facility]) - Abnormal; Notable for the following:    Ketones, ur 15 (*)    Protein, ur 30 (*)    All other components within normal limits  URINE MICROSCOPIC-ADD ON - Abnormal; Notable for the following:    Squamous Epithelial / LPF 0-5 (*)    Bacteria, UA RARE (*)    All other components within normal limits  CBC  CBG MONITORING, ED    EKG  EKG Interpretation  Date/Time:  Thursday February 19 2016 20:59:32 EST Ventricular Rate:  90 PR Interval:    QRS Duration: 98 QT Interval:  398 QTC Calculation: 486 R Axis:   97 Text Interpretation:  Undetermined rhythm Rightward axis  Incomplete right bundle branch block ST & T wave abnormality, consider inferolateral ischemia Prolonged QT Abnormal ECG pvcs are new Reconfirmed by Zenia Resides  MD, ANTHONY ([id number]) on 02/19/2016 11:29:03 PM       Radiology Ct Head Wo Contrast  Result Date: 02/19/2016 CLINICAL DATA:  Patient fell from bed this afternoon. Syncope. On Xarelto. EXAM: CT HEAD WITHOUT CONTRAST TECHNIQUE: Contiguous axial images were obtained from the base of the skull through the vertex without intravenous contrast. COMPARISON:  03/12/2015 FINDINGS: BRAIN: There is stable mild sulcal and moderate-to-marked ventricular prominence consistent with superficial and central atrophy. No intraparenchymal hemorrhage, mass effect nor midline shift. There is a moderate degree of patchy periventricular and subcortical white matter hypodensities consistent with chronic small vessel ischemic disease. No acute large vascular territory infarcts. No abnormal extra-axial fluid collections. Basal cisterns are patent. Small bilateral basal ganglial lacunes superior chronic. VASCULAR: Moderate calcific atherosclerosis of the carotid siphons. SKULL: No skull fracture. No significant scalp soft tissue swelling. SINUSES/ORBITS: The mastoid air-cells are clear. The included paranasal sinuses are well-aerated.The included ocular globes and orbital contents are non-suspicious. Well-circumscribed benign-appearing 7 mm rounded bone cyst along the periphery the right orbit. OTHER: None. IMPRESSION: Chronic moderate periventricular and subcortical white matter small vessel ischemic disease. Chronic bilateral small basal ganglial lacunar infarcts. No acute intracranial hemorrhage, midline shift or edema. No acute fracture. Electronically Signed   By: Ashley Royalty M.D.   On: 02/19/2016 23:35    Procedures Procedures (including critical care time)  Medications Ordered in ED Medications  methocarbamol (ROBAXIN) tablet 500 mg (500 mg Oral Given 02/19/16 2308)    acetaminophen (TYLENOL) tablet 1,000 mg (1,000 mg Oral Given 02/19/16 2308)     Initial Impression / Assessment and Plan / ED Course  I have reviewed the triage vital signs and the nursing notes.  Pertinent labs & imaging results that were available during my care of the patient were reviewed by me and considered in my medical decision making (see chart for details).  Clinical Course    82 y.o. female with hx of orthostatic syncope presents several hours after she had an episode identical to her previous. She complains of muscle tightness and soreness in her quadratus lumborum b/l, but more on the right. Exam as above, reassuring with no significant injury noted, no midline tenderness or deformity, neuro intact. CT head was done and showed no acute intracranial abnormality. Labs were drawn and returned showing no significant abnormalities. Feel that she had another episode of orthostatic syncope due to autonomic dysfunction due to not giving her a second to readjust due to her urinary urgency. UA negative for infection. She was given a dose of robaxin for symptomatic relief and her husband was massaging her back while in the ED and significantly helped her sx. She was rx'd the same and was recommended to follow up with her PCP in the next few days to further discuss her back pain, her syncope and her overactive bladder. Return precautions were given. This plan was discussed with the patient and her family at the bedside and they stated both understanding and agreement.   Final Clinical Impressions(s) / ED Diagnoses   Final diagnoses:  Syncope and collapse  Strain of lumbar region, initial encounter    New Prescriptions Discharge Medication List as of 02/19/2016 11:46 PM    START taking these medications   Details  methocarbamol (ROBAXIN) 500 MG tablet Take 1 tablet (500 mg total) by mouth every 8 (eight) hours as needed for muscle spasms., Starting Thu 02/19/2016, [place], DO 02/20/16 2024

## 2016-02-19 NOTE — ED Triage Notes (Signed)
Pt states "I passed out about lunch time, I was going into the bathroom and I passed out frons tanding and fell on the rug in the bathroom". Pt states she wasn't out for very long. Denies hitting head. Pt states she hit her back, c/o back pain right at her waist. Pt states shes on blood thinners xarelto. Pt in NAD, AAOX4 at this time.

## 2016-02-20 NOTE — ED Notes (Signed)
Patient Alert and oriented X4. Stable and ambulatory. Patient verbalized understanding of the discharge instructions.  Patient belongings were taken by the patient.  

## 2016-02-26 ENCOUNTER — Emergency Department (HOSPITAL_COMMUNITY): Payer: Medicare Other

## 2016-02-26 ENCOUNTER — Inpatient Hospital Stay (HOSPITAL_COMMUNITY)
Admission: EM | Admit: 2016-02-26 | Discharge: 2016-03-01 | DRG: 551 | Disposition: A | Discharge status: Skilled Nursing Facility | Payer: Medicare Other | Attending: Family Medicine | Admitting: Family Medicine

## 2016-02-26 ENCOUNTER — Encounter (HOSPITAL_COMMUNITY): Payer: Self-pay

## 2016-02-26 DIAGNOSIS — S32011A Stable burst fracture of first lumbar vertebra, initial encounter for closed fracture: Secondary | ICD-10-CM | POA: Diagnosis not present

## 2016-02-26 DIAGNOSIS — Z951 Presence of aortocoronary bypass graft: Secondary | ICD-10-CM

## 2016-02-26 DIAGNOSIS — I2510 Atherosclerotic heart disease of native coronary artery without angina pectoris: Secondary | ICD-10-CM | POA: Diagnosis present

## 2016-02-26 DIAGNOSIS — Z886 Allergy status to analgesic agent status: Secondary | ICD-10-CM

## 2016-02-26 DIAGNOSIS — Z8249 Family history of ischemic heart disease and other diseases of the circulatory system: Secondary | ICD-10-CM

## 2016-02-26 DIAGNOSIS — I160 Hypertensive urgency: Secondary | ICD-10-CM | POA: Diagnosis present

## 2016-02-26 DIAGNOSIS — Z888 Allergy status to other drugs, medicaments and biological substances status: Secondary | ICD-10-CM

## 2016-02-26 DIAGNOSIS — I214 Non-ST elevation (NSTEMI) myocardial infarction: Secondary | ICD-10-CM

## 2016-02-26 DIAGNOSIS — M545 Low back pain: Secondary | ICD-10-CM | POA: Diagnosis not present

## 2016-02-26 DIAGNOSIS — Y92002 Bathroom of unspecified non-institutional (private) residence single-family (private) house as the place of occurrence of the external cause: Secondary | ICD-10-CM

## 2016-02-26 DIAGNOSIS — I10 Essential (primary) hypertension: Secondary | ICD-10-CM | POA: Diagnosis present

## 2016-02-26 DIAGNOSIS — Z79899 Other long term (current) drug therapy: Secondary | ICD-10-CM

## 2016-02-26 DIAGNOSIS — Z885 Allergy status to narcotic agent status: Secondary | ICD-10-CM

## 2016-02-26 DIAGNOSIS — S32000A Wedge compression fracture of unspecified lumbar vertebra, initial encounter for closed fracture: Secondary | ICD-10-CM | POA: Diagnosis present

## 2016-02-26 DIAGNOSIS — I480 Paroxysmal atrial fibrillation: Secondary | ICD-10-CM | POA: Diagnosis present

## 2016-02-26 DIAGNOSIS — S32010A Wedge compression fracture of first lumbar vertebra, initial encounter for closed fracture: Secondary | ICD-10-CM | POA: Diagnosis present

## 2016-02-26 DIAGNOSIS — W1830XA Fall on same level, unspecified, initial encounter: Secondary | ICD-10-CM | POA: Diagnosis present

## 2016-02-26 DIAGNOSIS — Z7901 Long term (current) use of anticoagulants: Secondary | ICD-10-CM

## 2016-02-26 DIAGNOSIS — I252 Old myocardial infarction: Secondary | ICD-10-CM

## 2016-02-26 DIAGNOSIS — I4891 Unspecified atrial fibrillation: Secondary | ICD-10-CM | POA: Diagnosis present

## 2016-02-26 DIAGNOSIS — R402413 Glasgow coma scale score 13-15, at hospital admission: Secondary | ICD-10-CM | POA: Diagnosis present

## 2016-02-26 DIAGNOSIS — S32019A Unspecified fracture of first lumbar vertebra, initial encounter for closed fracture: Secondary | ICD-10-CM

## 2016-02-26 DIAGNOSIS — Z880 Allergy status to penicillin: Secondary | ICD-10-CM

## 2016-02-26 DIAGNOSIS — Z7982 Long term (current) use of aspirin: Secondary | ICD-10-CM

## 2016-02-26 DIAGNOSIS — S32001A Stable burst fracture of unspecified lumbar vertebra, initial encounter for closed fracture: Secondary | ICD-10-CM

## 2016-02-26 DIAGNOSIS — E785 Hyperlipidemia, unspecified: Secondary | ICD-10-CM | POA: Diagnosis present

## 2016-02-26 DIAGNOSIS — F0390 Unspecified dementia without behavioral disturbance: Secondary | ICD-10-CM | POA: Diagnosis present

## 2016-02-26 DIAGNOSIS — Z85828 Personal history of other malignant neoplasm of skin: Secondary | ICD-10-CM

## 2016-02-26 DIAGNOSIS — M810 Age-related osteoporosis without current pathological fracture: Secondary | ICD-10-CM | POA: Diagnosis present

## 2016-02-26 LAB — URINALYSIS, ROUTINE W REFLEX MICROSCOPIC
Bilirubin Urine: NEGATIVE
Glucose, UA: 100 mg/dL — AB
Hgb urine dipstick: NEGATIVE
KETONES UR: NEGATIVE mg/dL
LEUKOCYTES UA: NEGATIVE
NITRITE: NEGATIVE
PH: 6 (ref 5.0–8.0)
Protein, ur: NEGATIVE mg/dL
Specific Gravity, Urine: 1.007 (ref 1.005–1.030)

## 2016-02-26 MED ORDER — HYDROCODONE-ACETAMINOPHEN 5-325 MG PO TABS
1.0000 | ORAL_TABLET | Freq: Once | ORAL | Status: AC
  Administered 2016-02-26: 1 via ORAL
  Filled 2016-02-26: qty 1

## 2016-02-26 NOTE — ED Notes (Signed)
Scanned Pt bladder post void Pt had 170mL residual

## 2016-02-26 NOTE — ED Notes (Signed)
Patient transported to CT 

## 2016-02-26 NOTE — ED Triage Notes (Signed)
Pt was seen last Thus for a fall with syncope and had MRI of head, has since had back pain which has become worse, PCP is worried about compression fracture. Pain not relieved by home medications.

## 2016-02-26 NOTE — ED Provider Notes (Signed)
[facility] DEPT Provider Note   CSN: [id number] Arrival date & time: 02/26/16  1902  By signing my name below, I, Hansel Feinstein, attest that this documentation has been prepared under the direction and in the presence of Ezequiel Essex, MD. Electronically Signed: Hansel Feinstein, ED Scribe. 02/26/16. 11:33 PM.     History   Chief Complaint Chief Complaint  Patient presents with  . Back Pain    HPI Karen Arroyo is a 82 y.o. female with h/o PAF on Xarelto, CAD, CABG, DM, intermittent syncope secondary to autonomic dysfunction who presents to the Emergency Department complaining of moderate, worsening, persistent lower back pain s/p fall that occurred 7 days ago. Pt was seen in the ED 7 days ago for initial evaluation after a syncopal episode and fall complaining of lower back pain and was prescribed robaxin. Per son, pt stood up quickly after waking up with urgency and had a syncopal episode, likely hyperextended her back and struck her head on the ground. Pt has been ambulatory since falling. Pt was prescribed Tramadol by her PCP via phone this week in additional to robaxin and notes her pain is not adequately controlled with this treatment. Pt denies and falls or trauma since falling 7 days ago. Pt states pain is worsened with movement and in certain positions. Pt has h/o back pain secondary to osteoporosis. Pt ambulates without a cane or walker at baseline. Pt denies dizziness, increased urinary incontinence from baseline, bowel incontinence, pain radiation to buttock or legs, upper back pain, neck pain, constipation, sensation of incomplete voiding.   The history is provided by the patient and a relative. No language interpreter was used.    Past Medical History:  Diagnosis Date  . Coronary artery disease   . Diabetes mellitus   . Diastolic dysfunction   . Dyslipidemia   . Edema   . HX: long term anticoagulant use   . MI, old 54   involving small LCX  . PAF (paroxysmal atrial  fibrillation) ([place])   . Pulmonary hypertension   . S/P CABG (coronary artery bypass graft) 2000  . Skin cancer    nose, leg    Patient Active Problem List   Diagnosis Date Noted  . Hx of CABG 01/07/2016  . Mild cognitive impairment 06/05/2015  . Essential hypertension 06/05/2015  . Hyperlipidemia 06/05/2015  . Type 2 diabetes mellitus without complication, without long-term current use of insulin ([place]) 06/05/2015  . Syncope and collapse   . AKI (acute kidney injury) ([place]) 04/26/2015  . Syncope 04/26/2015  . PAF (paroxysmal atrial fibrillation) ([place])   . Diabetes ([place])   . Encounter for therapeutic drug monitoring 05/02/2013  . Malaise and fatigue 12/14/2010  . Chest pain 11/25/2010  . Atrial fibrillation ([place])   . Coronary artery disease   . Long term (current) use of anticoagulants   . Type II or unspecified type diabetes mellitus without mention of complication, uncontrolled   . Dyslipidemia   . Ischemia of heart, chronic   . Diastolic dysfunction   . Pulmonary hypertension ([place])     Past Surgical History:  Procedure Laterality Date  . CARDIAC CATHETERIZATION  1995   dr Oran Rein  . CORONARY ARTERY BYPASS GRAFT  2000   LIMA to LAD; SVG to intermediate, SVG to PD and RCA per Dr. Servando Snare  . OTHER SURGICAL HISTORY  1999   historectomy  . TUBAL LIGATION  1961    OB History    No data available  Home Medications    Prior to Admission medications   Medication Sig Start Date End Date Taking? Authorizing Provider  ammonium lactate (AMLACTIN) 12 % cream Apply 1 g topically as needed for dry skin.  11/01/11   Historical Provider, MD  aspirin EC 81 MG tablet Take 81 mg by mouth at bedtime.    Historical Provider, MD  atorvastatin (LIPITOR) 10 MG tablet TAKE 1 TABLET BY MOUTH EVERY DAY 12/18/14   Darlin Coco, MD  Calcium-Vitamin D (CALTRATE 600 PLUS-VIT D PO) Take 1 tablet by mouth daily.     Historical Provider, MD  Cholecalciferol (VITAMIN D3) 2000 units TABS  Take 1 tablet by mouth at bedtime.    Historical Provider, MD  citalopram (CELEXA) 20 MG tablet Take 1 tablet by mouth Daily. 10/23/10   Historical Provider, MD  donepezil (ARICEPT) 10 MG tablet Take 1 tablet (10 mg total) by mouth daily. 06/05/15   Cameron Sprang, MD  fexofenadine (ALLEGRA) 60 MG tablet Take 60 mg by mouth daily as needed for allergies or rhinitis.     Historical Provider, MD  furosemide (LASIX) 40 MG tablet Take 1 tablet (40 mg total) by mouth every other day. Alternates with 80 mg by mouth daily Patient taking differently: Take 1/2 tablet daily 10/03/14   Darlin Coco, MD  Loperamide HCl (IMODIUM PO) Take 1 tablet by mouth at bedtime.    Historical Provider, MD  methocarbamol (ROBAXIN) 500 MG tablet Take 1 tablet (500 mg total) by mouth every 8 (eight) hours as needed for muscle spasms. 02/19/16   Zenovia Jarred, DO  metoprolol tartrate (LOPRESSOR) 25 MG tablet Take 12.5 mg by mouth 2 (two) times daily.    Historical Provider, MD  mirabegron ER (MYRBETRIQ) 25 MG TB24 tablet Take 25 mg by mouth daily.    Historical Provider, MD  Misc Natural Products (JOINT SUPPORT PO) Take 1-2 capsules by mouth daily.    Historical Provider, MD  Multiple Vitamins-Minerals (MULTIVITAL PO) Take 1 capsule by mouth daily.     Historical Provider, MD  nitroGLYCERIN (NITROSTAT) 0.4 MG SL tablet Place 0.4 mg under the tongue every 5 (five) minutes as needed for chest pain ((MAX 3 doses)). Reported on 06/05/2015    Historical Provider, MD  pioglitazone (ACTOS) 45 MG tablet TAKE 1/2 TABLET BY MOUTH EVERY DAY Patient not taking: Reported on 02/19/2016 12/22/11   Darlin Coco, MD  potassium chloride (K-DUR) 10 MEQ tablet Take 10 mEq by mouth daily.    Historical Provider, MD  Probiotic Product (ALIGN) 4 MG CAPS Take 1 capsule by mouth daily.    Historical Provider, MD  Rivaroxaban (XARELTO) 15 MG TABS tablet Take 1 tablet (15 mg total) by mouth daily with supper. 04/28/15   [name], MD    Family  History Family History  Problem Relation Age of Onset  . Heart attack Mother   . Transient ischemic attack Father     Social History Social History  Substance Use Topics  . Smoking status: Never Smoker  . Smokeless tobacco: Never Used  . Alcohol use 1.8 oz/week    3 Glasses of wine per week     Allergies   Aspirin; Dimetapp cold-allergy; Ilosone [erythromycin estolate]; Morphine and related; Penicillins; Restoril; and Zolpidem tartrate   Review of Systems Review of Systems A complete 10 system review of systems was obtained and all systems are negative except as noted in the HPI and PMH.    Physical Exam Updated Vital Signs BP 158/95 (BP  Location: Right Arm)   Pulse 78   Temp 99 F (37.2 C) (Oral)   Resp 18   Ht 5\' 4"  (1.626 m)   Wt 130 lb (59 kg)   SpO2 96%   BMI 22.31 kg/m   Physical Exam  Constitutional: She is oriented to person, place, and time. She appears well-developed and well-nourished. No distress.  HENT:  Head: Normocephalic and atraumatic.  Mouth/Throat: Oropharynx is clear and moist. No oropharyngeal exudate.  Eyes: Conjunctivae and EOM are normal. Pupils are equal, round, and reactive to light.  Neck: Normal range of motion. Neck supple.  No meningismus.  Cardiovascular: Normal rate, regular rhythm, normal heart sounds and intact distal pulses.   No murmur heard. Pulmonary/Chest: Effort normal and breath sounds normal. No respiratory distress.  Abdominal: Soft. There is tenderness. There is no rebound and no guarding.  Mild suprapubic tenderness.   Musculoskeletal: Normal range of motion. She exhibits tenderness. She exhibits no edema.  Paraspinal lumbar pain bilaterally. No midline lumbar pain. 5/5 strength in bilateral lower extremities. Ankle plantar and dorsiflexion intact. Great toe extension intact bilaterally. +2 DP and PT pulses. +2 patellar reflexes bilaterally. No C-spine tenderness   Neurological: She is alert and oriented to person,  place, and time. No cranial nerve deficit. She exhibits normal muscle tone. Coordination normal.  No ataxia on finger to nose bilaterally. No pronator drift. 5/5 strength throughout. CN 2-12 intact.Equal grip strength. Sensation intact.   Skin: Skin is warm.  Psychiatric: She has a normal mood and affect. Her behavior is normal.  Nursing note and vitals reviewed.    ED Treatments / Results   DIAGNOSTIC STUDIES: Oxygen Saturation is 96% on RA, adequate by my interpretation.    COORDINATION OF CARE: 11:26 PM Discussed treatment plan with pt at bedside which includes UA, XR, PVR and pt agreed to plan.    Labs (all labs ordered are listed, but only abnormal results are displayed) Labs Reviewed  URINALYSIS, ROUTINE W REFLEX MICROSCOPIC (NOT AT [facility]) - Abnormal; Notable for the following:       Result Value   Glucose, UA 100 (*)    All other components within normal limits  COMPREHENSIVE METABOLIC PANEL - Abnormal; Notable for the following:    Potassium 2.9 (*)    Chloride 100 (*)    Glucose, Bld 296 (*)    Creatinine, Ser 1.11 (*)    Total Protein 6.1 (*)    GFR calc non Af Amer 46 (*)    GFR calc Af Amer 53 (*)    All other components within normal limits  GLUCOSE, CAPILLARY - Abnormal; Notable for the following:    Glucose-Capillary 202 (*)    All other components within normal limits  CBC WITH DIFFERENTIAL/PLATELET  COMPREHENSIVE METABOLIC PANEL  CBC WITH DIFFERENTIAL/PLATELET  HEMOGLOBIN A1C  APTT  HEPARIN LEVEL (UNFRACTIONATED)    EKG  EKG Interpretation None       Radiology No results found.  Procedures Procedures (including critical care time)  Medications Ordered in ED Medications - No data to display   Initial Impression / Assessment and Plan / ED Course  I have reviewed the triage vital signs and the nursing notes.  Pertinent labs & imaging results that were available during my care of the patient were reviewed by me and considered in my medical  decision making (see chart for details).  Clinical Course    Ongoing lumbar back pain after fall 1 week ago. No previous back issues. No  focal weakness, numbness or tingling. Has urinary incontinence at baseline which is unchanged. No bowel incontinence. No fevers or vomiting.  Equal strength and sensation. Equal pulses. Postvoid residual 106. UA is negative.  CT scan confirms L1 compression fracture with retropulsion. Patient has equal strength and sensation in lower extremities or urinary incontinence is at baseline. Postvoid residual is 100.  We'll obtain an MRI. Discussed with neurosurgery. Both patient and son would prefer admission as patient is unable to walk or take care of herself or her husband at home.  D/w Dr. Saintclair Halsted who agrees with MRI, TLSO brace, bed rest, medical admission. He will consult in the morning.  Admission d/w Dr. Hal Hope. Will treat hypokalemia and elevated BP.  Final Clinical Impressions(s) / ED Diagnoses   Final diagnoses:  Compression fracture of first lumbar vertebra (HCC)  Closed compression fracture of first lumbar vertebra, initial encounter [facility])    New Prescriptions New Prescriptions   No medications on file   I personally performed the services described in this documentation, which was scribed in my presence. The recorded information has been reviewed and is accurate.    Ezequiel Essex, MD 02/27/16 [phone]

## 2016-02-27 ENCOUNTER — Encounter (HOSPITAL_COMMUNITY): Payer: Self-pay | Admitting: Internal Medicine

## 2016-02-27 ENCOUNTER — Inpatient Hospital Stay (HOSPITAL_COMMUNITY): Payer: Medicare Other

## 2016-02-27 ENCOUNTER — Observation Stay (HOSPITAL_COMMUNITY): Payer: Medicare Other

## 2016-02-27 DIAGNOSIS — S32010A Wedge compression fracture of first lumbar vertebra, initial encounter for closed fracture: Secondary | ICD-10-CM | POA: Diagnosis present

## 2016-02-27 DIAGNOSIS — I4891 Unspecified atrial fibrillation: Secondary | ICD-10-CM

## 2016-02-27 DIAGNOSIS — S32010D Wedge compression fracture of first lumbar vertebra, subsequent encounter for fracture with routine healing: Secondary | ICD-10-CM

## 2016-02-27 DIAGNOSIS — S32000A Wedge compression fracture of unspecified lumbar vertebra, initial encounter for closed fracture: Secondary | ICD-10-CM | POA: Diagnosis present

## 2016-02-27 LAB — CBC WITH DIFFERENTIAL/PLATELET
BASOS ABS: 0 10*3/uL (ref 0.0–0.1)
BASOS PCT: 1 %
BASOS PCT: 0 %
Basophils Absolute: 0 10*3/uL (ref 0.0–0.1)
EOS ABS: 0.1 10*3/uL (ref 0.0–0.7)
EOS PCT: 1 %
EOS PCT: 1 %
Eosinophils Absolute: 0.1 10*3/uL (ref 0.0–0.7)
HCT: 39.1 % (ref 36.0–46.0)
HCT: 38.9 % (ref 36.0–46.0)
HEMOGLOBIN: 13.2 g/dL (ref 12.0–15.0)
Hemoglobin: 13.4 g/dL (ref 12.0–15.0)
Lymphocytes Relative: 19 %
Lymphocytes Relative: 9 %
Lymphs Abs: 1.2 10*3/uL (ref 0.7–4.0)
Lymphs Abs: 0.7 10*3/uL (ref 0.7–4.0)
MCH: 29.3 pg (ref 26.0–34.0)
MCH: 29.1 pg (ref 26.0–34.0)
MCHC: 34.3 g/dL (ref 30.0–36.0)
MCHC: 33.9 g/dL (ref 30.0–36.0)
MCV: 85.4 fL (ref 78.0–100.0)
MCV: 85.7 fL (ref 78.0–100.0)
MONO ABS: 0.8 10*3/uL (ref 0.1–1.0)
MONOS PCT: 9 %
Monocytes Absolute: 0.5 10*3/uL (ref 0.1–1.0)
Monocytes Relative: 8 %
NEUTROS PCT: 81 %
Neutro Abs: 4.4 10*3/uL (ref 1.7–7.7)
Neutro Abs: 6.9 10*3/uL (ref 1.7–7.7)
Neutrophils Relative %: 71 %
PLATELETS: 206 10*3/uL (ref 150–400)
PLATELETS: 209 10*3/uL (ref 150–400)
RBC: 4.58 MIL/uL (ref 3.87–5.11)
RBC: 4.54 MIL/uL (ref 3.87–5.11)
RDW: 14 % (ref 11.5–15.5)
RDW: 14.3 % (ref 11.5–15.5)
WBC: 6.2 10*3/uL (ref 4.0–10.5)
WBC: 8.5 10*3/uL (ref 4.0–10.5)

## 2016-02-27 LAB — COMPREHENSIVE METABOLIC PANEL
ALBUMIN: 3.7 g/dL (ref 3.5–5.0)
ALK PHOS: 74 U/L (ref 38–126)
ALT: 14 U/L (ref 14–54)
ALT: 16 U/L (ref 14–54)
AST: 19 U/L (ref 15–41)
AST: 20 U/L (ref 15–41)
Albumin: 3.6 g/dL (ref 3.5–5.0)
Alkaline Phosphatase: 83 U/L (ref 38–126)
Anion gap: 12 (ref 5–15)
Anion gap: 8 (ref 5–15)
BUN: 14 mg/dL (ref 6–20)
BUN: 8 mg/dL (ref 6–20)
CALCIUM: 9.2 mg/dL (ref 8.9–10.3)
CHLORIDE: 100 mmol/L — AB (ref 101–111)
CO2: 24 mmol/L (ref 22–32)
CO2: 29 mmol/L (ref 22–32)
CREATININE: 0.86 mg/dL (ref 0.44–1.00)
Calcium: 9.2 mg/dL (ref 8.9–10.3)
Chloride: 101 mmol/L (ref 101–111)
Creatinine, Ser: 1.11 mg/dL — ABNORMAL HIGH (ref 0.44–1.00)
GFR calc Af Amer: 53 mL/min — ABNORMAL LOW (ref >60)
GFR calc non Af Amer: 46 mL/min — ABNORMAL LOW (ref >60)
GFR calc non Af Amer: >60 mL/min (ref >60)
GLUCOSE: 296 mg/dL — AB (ref 65–99)
Glucose, Bld: 162 mg/dL — ABNORMAL HIGH (ref 65–99)
POTASSIUM: 2.9 mmol/L — AB (ref 3.5–5.1)
Potassium: 2.9 mmol/L — ABNORMAL LOW (ref 3.5–5.1)
SODIUM: 136 mmol/L (ref 135–145)
SODIUM: 138 mmol/L (ref 135–145)
Total Bilirubin: 0.7 mg/dL (ref 0.3–1.2)
Total Bilirubin: 1.1 mg/dL (ref 0.3–1.2)
Total Protein: 6.1 g/dL — ABNORMAL LOW (ref 6.5–8.1)
Total Protein: 5.9 g/dL — ABNORMAL LOW (ref 6.5–8.1)

## 2016-02-27 LAB — GLUCOSE, CAPILLARY
GLUCOSE-CAPILLARY: 202 mg/dL — AB (ref 65–99)
GLUCOSE-CAPILLARY: 170 mg/dL — AB (ref 65–99)
Glucose-Capillary: 251 mg/dL — ABNORMAL HIGH (ref 65–99)
Glucose-Capillary: 140 mg/dL — ABNORMAL HIGH (ref 65–99)

## 2016-02-27 LAB — APTT: APTT: 78 s — AB (ref 24–36)

## 2016-02-27 MED ORDER — HYDRALAZINE HCL 20 MG/ML IJ SOLN
5.0000 mg | Freq: Once | INTRAMUSCULAR | Status: AC
  Administered 2016-02-27: 5 mg via INTRAVENOUS
  Filled 2016-02-27: qty 1

## 2016-02-27 MED ORDER — METHOCARBAMOL 500 MG PO TABS
500.0000 mg | ORAL_TABLET | Freq: Three times a day (TID) | ORAL | Status: DC | PRN
  Administered 2016-02-27 – 2016-03-01 (×9): 500 mg via ORAL
  Filled 2016-02-27 (×9): qty 1

## 2016-02-27 MED ORDER — FENTANYL CITRATE (PF) 100 MCG/2ML IJ SOLN
25.0000 ug | INTRAMUSCULAR | Status: DC | PRN
  Administered 2016-02-27 (×2): 25 ug via INTRAVENOUS
  Filled 2016-02-27 (×2): qty 2

## 2016-02-27 MED ORDER — MIRABEGRON ER 25 MG PO TB24
25.0000 mg | ORAL_TABLET | Freq: Every day | ORAL | Status: DC
  Administered 2016-02-27 – 2016-03-01 (×4): 25 mg via ORAL
  Filled 2016-02-27 (×4): qty 1

## 2016-02-27 MED ORDER — ONDANSETRON HCL 4 MG/2ML IJ SOLN
4.0000 mg | Freq: Four times a day (QID) | INTRAMUSCULAR | Status: DC | PRN
  Administered 2016-02-29: 4 mg via INTRAVENOUS

## 2016-02-27 MED ORDER — SODIUM CHLORIDE 0.9 % IV SOLN
INTRAVENOUS | Status: AC
  Administered 2016-02-27: 08:00:00 via INTRAVENOUS

## 2016-02-27 MED ORDER — CITALOPRAM HYDROBROMIDE 20 MG PO TABS
20.0000 mg | ORAL_TABLET | Freq: Every evening | ORAL | Status: DC
  Administered 2016-02-27 – 2016-02-29 (×3): 20 mg via ORAL
  Filled 2016-02-27 (×3): qty 1

## 2016-02-27 MED ORDER — DONEPEZIL HCL 10 MG PO TABS
10.0000 mg | ORAL_TABLET | Freq: Every day | ORAL | Status: DC
  Administered 2016-02-27 – 2016-03-01 (×4): 10 mg via ORAL
  Filled 2016-02-27 (×4): qty 1

## 2016-02-27 MED ORDER — VITAMIN D 1000 UNITS PO TABS
2000.0000 [IU] | ORAL_TABLET | Freq: Every day | ORAL | Status: DC
  Administered 2016-02-27 – 2016-02-29 (×3): 2000 [IU] via ORAL
  Filled 2016-02-27 (×3): qty 2

## 2016-02-27 MED ORDER — ATORVASTATIN CALCIUM 10 MG PO TABS
10.0000 mg | ORAL_TABLET | Freq: Every day | ORAL | Status: DC
  Administered 2016-02-27 – 2016-02-29 (×3): 10 mg via ORAL
  Filled 2016-02-27 (×3): qty 1

## 2016-02-27 MED ORDER — POTASSIUM CHLORIDE CRYS ER 10 MEQ PO TBCR
10.0000 meq | EXTENDED_RELEASE_TABLET | Freq: Every day | ORAL | Status: DC
  Administered 2016-02-27 – 2016-03-01 (×4): 10 meq via ORAL
  Filled 2016-02-27 (×4): qty 1

## 2016-02-27 MED ORDER — ACETAMINOPHEN 650 MG RE SUPP: 650.0000 mg | Freq: Four times a day (QID) | RECTAL | Status: DC | PRN

## 2016-02-27 MED ORDER — FENTANYL CITRATE (PF) 100 MCG/2ML IJ SOLN
50.0000 ug | INTRAMUSCULAR | Status: DC | PRN
  Administered 2016-02-28 – 2016-02-29 (×7): 50 ug via INTRAVENOUS
  Administered 2016-03-01 (×2): 25 ug via INTRAVENOUS
  Filled 2016-02-27 (×9): qty 2

## 2016-02-27 MED ORDER — LORATADINE 10 MG PO TABS
10.0000 mg | ORAL_TABLET | Freq: Every day | ORAL | Status: DC
  Administered 2016-02-27 – 2016-03-01 (×4): 10 mg via ORAL
  Filled 2016-02-27 (×4): qty 1

## 2016-02-27 MED ORDER — HEPARIN (PORCINE) IN NACL 100-0.45 UNIT/ML-% IJ SOLN
900.0000 [IU]/h | INTRAMUSCULAR | Status: DC
  Administered 2016-02-27 – 2016-02-29 (×3): 900 [IU]/h via INTRAVENOUS
  Filled 2016-02-27 (×3): qty 250

## 2016-02-27 MED ORDER — METOPROLOL TARTRATE 12.5 MG HALF TABLET
12.5000 mg | ORAL_TABLET | Freq: Two times a day (BID) | ORAL | Status: DC
  Administered 2016-02-27 – 2016-02-28 (×3): 12.5 mg via ORAL
  Filled 2016-02-27 (×3): qty 1

## 2016-02-27 MED ORDER — ACETAMINOPHEN 325 MG PO TABS
650.0000 mg | ORAL_TABLET | Freq: Four times a day (QID) | ORAL | Status: DC | PRN
  Administered 2016-02-27 – 2016-02-28 (×4): 650 mg via ORAL
  Filled 2016-02-27 (×4): qty 2

## 2016-02-27 MED ORDER — METOPROLOL TARTRATE 25 MG PO TABS
12.5000 mg | ORAL_TABLET | Freq: Once | ORAL | Status: AC
  Administered 2016-02-27: 12.5 mg via ORAL
  Filled 2016-02-27: qty 1

## 2016-02-27 MED ORDER — INSULIN ASPART 100 UNIT/ML ~~LOC~~ SOLN
0.0000 [IU] | Freq: Three times a day (TID) | SUBCUTANEOUS | Status: DC
  Administered 2016-02-27: 2 [IU] via SUBCUTANEOUS
  Administered 2016-02-27: 3 [IU] via SUBCUTANEOUS
  Administered 2016-02-27: 5 [IU] via SUBCUTANEOUS
  Administered 2016-02-28: 7 [IU] via SUBCUTANEOUS
  Administered 2016-02-28 – 2016-02-29 (×3): 2 [IU] via SUBCUTANEOUS
  Administered 2016-02-29: 3 [IU] via SUBCUTANEOUS
  Administered 2016-02-29 – 2016-03-01 (×3): 2 [IU] via SUBCUTANEOUS
  Administered 2016-03-01: 3 [IU] via SUBCUTANEOUS

## 2016-02-27 MED ORDER — ONDANSETRON HCL 4 MG PO TABS: 4.0000 mg | ORAL_TABLET | Freq: Four times a day (QID) | ORAL | Status: DC | PRN

## 2016-02-27 MED ORDER — POTASSIUM CHLORIDE CRYS ER 20 MEQ PO TBCR
40.0000 meq | EXTENDED_RELEASE_TABLET | Freq: Once | ORAL | Status: AC
  Administered 2016-02-27: 40 meq via ORAL
  Filled 2016-02-27: qty 2

## 2016-02-27 MED ORDER — FENTANYL CITRATE (PF) 100 MCG/2ML IJ SOLN
25.0000 ug | Freq: Once | INTRAMUSCULAR | Status: AC
  Administered 2016-02-27: 25 ug via INTRAVENOUS
  Filled 2016-02-27: qty 2

## 2016-02-27 MED ORDER — GLUCERNA SHAKE PO LIQD
237.0000 mL | Freq: Two times a day (BID) | ORAL | Status: DC
  Administered 2016-02-27 – 2016-02-28 (×2): 237 mL via ORAL

## 2016-02-27 MED ORDER — ASPIRIN EC 81 MG PO TBEC
81.0000 mg | DELAYED_RELEASE_TABLET | Freq: Every day | ORAL | Status: DC
Start: 2016-02-27 — End: 2016-03-01
  Administered 2016-02-27 – 2016-02-29 (×3): 81 mg via ORAL
  Filled 2016-02-27 (×3): qty 1

## 2016-02-27 MED ORDER — ONDANSETRON HCL 4 MG/2ML IJ SOLN
4.0000 mg | Freq: Four times a day (QID) | INTRAMUSCULAR | Status: DC | PRN
  Administered 2016-02-27 – 2016-03-01 (×3): 4 mg via INTRAVENOUS
  Filled 2016-02-27 (×4): qty 2

## 2016-02-27 MED ORDER — POTASSIUM CHLORIDE CRYS ER 20 MEQ PO TBCR
40.0000 meq | EXTENDED_RELEASE_TABLET | ORAL | Status: AC
  Administered 2016-02-27 (×2): 40 meq via ORAL
  Filled 2016-02-27: qty 2

## 2016-02-27 MED ORDER — SODIUM CHLORIDE 0.9 % IV SOLN
Freq: Once | INTRAVENOUS | Status: AC
  Administered 2016-02-27: 18:00:00 via INTRAVENOUS
  Filled 2016-02-27: qty 1000

## 2016-02-27 NOTE — Care Management CC44 (Signed)
Condition Code 44 Documentation Completed  Patient Details  Name: CADE WALDROOP MRN: [id number] Date of Birth: 31-Jul-1933   Condition Code 44 given:  Yes Patient signature on Condition Code 44 notice:  Yes Documentation of 2 MD's agreement:  Yes Code 44 added to claim:  Yes    Pollie Friar, RN 02/27/2016, 2:53 PM

## 2016-02-27 NOTE — H&P (Signed)
History and Physical    Karen Arroyo [id number] DOB: 1933/11/07 DOA: 02/26/2016  PCP: Tivis Ringer, MD  Patient coming from: Home.  Chief Complaint: Low back pain.  HPI: Karen Arroyo is a 82 y.o. female with history of CAD status post CABG, chronic atrial fibrillation, diabetes mellitus, memory issues presents to the ER because of persistent low back pain since fall last week. As per patient's son who provided the history patient had a fall last week while going to the bathroom. Patient had a syncopal episode and has had recurrent syncopal episode previously with extensive workup with cardiology. After last week's syncopal episode and fall, as per the patient's son patient had come to the ER and had CT head done which was unremarkable and patient was discharged home on Robaxin for pain. Subsequent to that patient started developing worsening pain in the low back and patient's primary care prescribed tramadol. Despite taking tramadol and Robaxin patient's pain worsened and came to the ER. X-rays reveal lumbar fracture and on-call neurosurgeon Dr. Saintclair Halsted was consulted and patient is being placed on TLSO brace and neurosurgery will be seeing patient in consult.   ED Course: See history of presenting illness. Patient was given hydralazine for elevated blood pressure.  Review of Systems: As per HPI, rest all negative.   Past Medical History:  Diagnosis Date  . Coronary artery disease   . Diabetes mellitus   . Diastolic dysfunction   . Dyslipidemia   . Edema   . HX: long term anticoagulant use   . 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 PAF (paroxysmal atrial fibrillation) ([place])   . Pulmonary hypertension   . S/P CABG (coronary artery bypass graft) 2000  . Skin cancer    nose, leg    Past Surgical History:  Procedure Laterality Date  . CARDIAC CATHETERIZATION  1995   dr Oran Rein  . CORONARY ARTERY BYPASS GRAFT  2000   LIMA to LAD; SVG to intermediate, SVG to PD and RCA per Dr.  Servando Snare  . OTHER SURGICAL HISTORY  1999   historectomy  . TUBAL LIGATION  1961     reports that she has never smoked. She has never used smokeless tobacco. She reports that she drinks about 1.8 oz of alcohol per week . She reports that she does not use drugs.  Allergies  Allergen Reactions  . Aspirin     Rash - only in high doses  . Dimetapp Cold-Allergy     Rash   . Ilosone [Erythromycin Estolate]     rash  . Morphine And Related     rash  . Penicillins     Rash Has patient had a PCN reaction causing immediate rash, facial/tongue/throat swelling, SOB or lightheadedness with hypotension: YES Has patient had a PCN reaction causing severe rash involving mucus membranes or skin necrosis:NO Has patient had a PCN reaction that required hospitalization NO Has patient had a PCN reaction occurring within the last 10 years:NO If all of the above answers are "NO", then may proceed with Cephalosporin use.  Marland Kitchen Restoril     rash  . Zolpidem Tartrate     rash    Family History  Problem Relation Age of Onset  . Heart attack Mother   . Transient ischemic attack Father     Prior to Admission medications   Medication Sig Start Date End Date Taking? Authorizing Provider  ammonium lactate (AMLACTIN) 12 % cream Apply 1 g topically as needed  for dry skin.  11/01/11  Yes Historical Provider, MD  aspirin EC 81 MG tablet Take 81 mg by mouth at bedtime.   Yes Historical Provider, MD  atorvastatin (LIPITOR) 10 MG tablet TAKE 1 TABLET BY MOUTH EVERY DAY 12/18/14  Yes Darlin Coco, MD  Calcium-Vitamin D (CALTRATE 600 PLUS-VIT D PO) Take 1 tablet by mouth daily.    Yes Historical Provider, MD  Cholecalciferol (VITAMIN D3) 2000 units TABS Take 1 tablet by mouth at bedtime.   Yes Historical Provider, MD  citalopram (CELEXA) 20 MG tablet Take 1 tablet by mouth every evening.  10/23/10  Yes Historical Provider, MD  donepezil (ARICEPT) 10 MG tablet Take 1 tablet (10 mg total) by mouth daily. 06/05/15  Yes  Cameron Sprang, MD  fexofenadine (ALLEGRA) 60 MG tablet Take 60 mg by mouth daily as needed for allergies or rhinitis.    Yes Historical Provider, MD  loperamide (IMODIUM) 2 MG capsule Take 2 mg by mouth daily as needed for diarrhea or loose stools.   Yes Historical Provider, MD  methocarbamol (ROBAXIN) 500 MG tablet Take 1 tablet (500 mg total) by mouth every 8 (eight) hours as needed for muscle spasms. 02/19/16  Yes Zenovia Jarred, DO  metoprolol tartrate (LOPRESSOR) 25 MG tablet Take 12.5 mg by mouth 2 (two) times daily.   Yes Historical Provider, MD  mirabegron ER (MYRBETRIQ) 25 MG TB24 tablet Take 25 mg by mouth daily.   Yes Historical Provider, MD  Misc Natural Products (JOINT SUPPORT PO) Take 1-2 capsules by mouth daily.   Yes Historical Provider, MD  Multiple Vitamins-Minerals (MULTIVITAL PO) Take 1 capsule by mouth daily.    Yes Historical Provider, MD  nitroGLYCERIN (NITROSTAT) 0.4 MG SL tablet Place 0.4 mg under the tongue every 5 (five) minutes as needed for chest pain ((MAX 3 doses)). Reported on 06/05/2015   Yes Historical Provider, MD  potassium chloride (K-DUR) 10 MEQ tablet Take 10 mEq by mouth daily.   Yes Historical Provider, MD  Probiotic Product (ALIGN) 4 MG CAPS Take 1 capsule by mouth daily.   Yes Historical Provider, MD  Rivaroxaban (XARELTO) 15 MG TABS tablet Take 1 tablet (15 mg total) by mouth daily with supper. 04/28/15  Yes [name], MD  traMADol (ULTRAM) 50 MG tablet Take 50 mg by mouth every 6 (six) hours as needed for moderate pain.   Yes Historical Provider, MD    Physical Exam: Vitals:   02/27/16 0215 02/27/16 0230 02/27/16 0500 02/27/16 0545  BP: (!) 202/101 (!) 173/105 (!) 187/86   Pulse:   77   Resp: 19 14 20    Temp:    98.7 F (37.1 C)  TempSrc:    Oral  SpO2:   97%   Weight:    60.4 kg (133 lb 3.2 oz)  Height:    5\' 4"  (1.626 m)      Constitutional: Moderately built and nourished. Vitals:   02/27/16 0215 02/27/16 0230 02/27/16 0500 02/27/16 0545    BP: (!) 202/101 (!) 173/105 (!) 187/86   Pulse:   77   Resp: 19 14 20    Temp:    98.7 F (37.1 C)  TempSrc:    Oral  SpO2:   97%   Weight:    60.4 kg (133 lb 3.2 oz)  Height:    5\' 4"  (1.626 m)   Eyes: Anicteric no pallor. ENMT: No discharge from the ears eyes nose or mouth. Neck: No mass felt. No neck rigidity. Respiratory:  No rhonchi or crepitations. Cardiovascular: S1 and S2 heard. Abdomen: Soft nontender bowel sounds present. Musculoskeletal: No edema. Skin: No rash. Neurologic: Alert awake oriented to time place and person. Moves all extremities. Psychiatric: Appears normal. Normal exam.   Labs on Admission: I have personally reviewed following labs and imaging studies  CBC:  Recent Labs Lab 02/26/16 2324  WBC 6.2  NEUTROABS 4.4  HGB 13.4  HCT 39.1  MCV 85.4  PLT 99991111   Basic Metabolic Panel:  Recent Labs Lab 02/26/16 2324  NA 136  K 2.9*  CL 100*  CO2 24  GLUCOSE 296*  BUN 14  CREATININE 1.11*  CALCIUM 9.2   GFR: Estimated Creatinine Clearance: 34.9 mL/min (by C-G formula based on SCr of 1.11 mg/dL (H)). Liver Function Tests:  Recent Labs Lab 02/26/16 2324  AST 19  ALT 14  ALKPHOS 83  BILITOT 0.7  PROT 6.1*  ALBUMIN 3.7   No results for input(s): LIPASE, AMYLASE in the last 168 hours. No results for input(s): AMMONIA in the last 168 hours. Coagulation Profile: No results for input(s): INR, PROTIME in the last 168 hours. Cardiac Enzymes: No results for input(s): CKTOTAL, CKMB, CKMBINDEX, TROPONINI in the last 168 hours. BNP (last 3 results) No results for input(s): PROBNP in the last 8760 hours. HbA1C: No results for input(s): HGBA1C in the last 72 hours. CBG: No results for input(s): GLUCAP in the last 168 hours. Lipid Profile: No results for input(s): CHOL, HDL, LDLCALC, TRIG, CHOLHDL, LDLDIRECT in the last 72 hours. Thyroid Function Tests: No results for input(s): TSH, T4TOTAL, FREET4, T3FREE, THYROIDAB in the last 72  hours. Anemia Panel: No results for input(s): VITAMINB12, FOLATE, FERRITIN, TIBC, IRON, RETICCTPCT in the last 72 hours. Urine analysis:    Component Value Date/Time   COLORURINE YELLOW 02/26/2016 2309   APPEARANCEUR CLEAR 02/26/2016 2309   LABSPEC 1.007 02/26/2016 2309   PHURINE 6.0 02/26/2016 2309   GLUCOSEU 100 (A) 02/26/2016 2309   HGBUR NEGATIVE 02/26/2016 2309   BILIRUBINUR NEGATIVE 02/26/2016 2309   KETONESUR NEGATIVE 02/26/2016 2309   PROTEINUR NEGATIVE 02/26/2016 2309   NITRITE NEGATIVE 02/26/2016 2309   LEUKOCYTESUR NEGATIVE 02/26/2016 2309   Sepsis Labs: @LABRCNTIP (procalcitonin:4,lacticidven:4) )No results found for this or any previous visit (from the past 240 hour(s)).   Radiological Exams on Admission: Ct Lumbar Spine Wo Contrast  Result Date: 02/27/2016 CLINICAL DATA:  82 y/o  F; back pain after a fall. EXAM: CT LUMBAR SPINE WITHOUT CONTRAST TECHNIQUE: Multidetector CT imaging of the lumbar spine was performed without intravenous contrast administration. Multiplanar CT image reconstructions were also generated. COMPARISON:  CT abdomen and pelvis 07/09/2005 FINDINGS: Segmentation: 5 lumbar type vertebrae. Alignment: Normal. Vertebrae: Moderate L1 superior endplate compression deformity within acute appearance. There is retropulsion of the posterior margin and lateral margins of the superior endplate into the central, left subarticular, left foraminal, and left extra foraminal zones. Mild resultant bony canal stenosis. No other fracture is identified. Paraspinal and other soft tissues: Severe calcific atherosclerosis of the abdominal aorta. Gallstones. There is mild paraspinal edema at the level of the L1 vertebral body. No hematoma identified. Punctate densities within the right renal hilum probably represent vascular calcifications, less likely nephrolithiasis. Disc levels: Lumbar spondylosis and facet arthropathy without high-grade bony canal stenosis or foraminal  narrowing. IMPRESSION: Moderate L1 superior endplate compression deformity with acute appearance and retropulsion of the posterior aspect of the superior endplate into the spinal canal with mild resultant bony canal stenosis. MRI can better assess degree  of neural impingement if clinically indicated. No other fracture identified. Electronically Signed   By: Kristine Garbe M.D.   On: 02/27/2016 00:31   Mr Lumbar Spine Wo Contrast  Result Date: 02/27/2016 CLINICAL DATA:  82 y/o  F; L1 compression deformity. EXAM: MRI LUMBAR SPINE WITHOUT CONTRAST TECHNIQUE: Multiplanar, multisequence MR imaging of the lumbar spine was performed. No intravenous contrast was administered. COMPARISON:  02/26/2016 lumbar CT. FINDINGS: Segmentation:  Standard. Alignment:  Physiologic. Vertebrae: Superior endplate deformity of L1 vertebral body with moderate loss of height and mild retropulsion of the posterior aspect of the superior endplate. There is edema within the vertebral body extending into the pedicles. No evidence for additional fracture, diskitis, or suspicious osseous lesion. Prominent S2 Tarlov cysts. Conus medullaris: Extends to the L1-2 level and appears normal. Paraspinal and other soft tissues: Left kidney lower pole T2 hyperintense structure measuring 18 mm probably representing a cyst. Disc levels: T12-L1: The retropulsed superior endplate of L1 effaces the anterior thecal sac. There is no cord impingement or significant canal stenosis. Neural foramen are patent. L1-2: No significant disc displacement, foraminal narrowing, or canal stenosis. L2-3: Minimal disc bulge without significant foraminal narrowing or canal stenosis. L3-4: Minimal disc bulge without significant foraminal narrowing or canal stenosis. L4-5: Small disc bulge and mild facet/ligamentum flavum hypertrophy. Mild bilateral foraminal and lateral recess narrowing. No significant canal stenosis. Trace facet effusions. L5-S1: Minimal disc bulge  mild bilateral facet hypertrophy. No significant foraminal narrowing or canal stenosis. IMPRESSION: L1 superior end plate acute moderate compression deformity. Mild retropulsion of the superior endplate effaces the anterior thecal sac of the spinal canal at that level without significant canal stenosis. There is no evidence for neural impingement. Electronically Signed   By: Kristine Garbe M.D.   On: 02/27/2016 05:08     Assessment/Plan Principal Problem:   Compression fracture of first lumbar vertebra (HCC) Active Problems:   Atrial fibrillation (HCC)   PAF (paroxysmal atrial fibrillation) (HCC)   Essential hypertension   Hx of CABG   Lumbar compression fracture ([place])    1. Compression fracture of the L1 vertebra with retropulsion - Dr. Saintclair Halsted neurosurgeon was consulted by ER physician and had recommended TLSO brace and will be seeing patient in consult. I have requested physical therapy consult. Will place patient on fentanyl 50 g IV every 2 hourly when necessary with Robaxin when necessary. 2. Uncontrolled hypertension with hypertensive urgency - probably related to pain. I have placed patient on when necessary IV hydralazine. Continue metoprolol. Positive for blood pressure trends. 3. Diabetes mellitus type 2 - patient is not on any home medications. I have placed patient on sliding scale coverage. Check hemoglobin A1c. 4. Atrial fibrillation - patient is on metoprolol which will be continued. Since patient may need procedures I will place patient on heparin for now and if no procedures anticipated may change to xarelto (home dose). 5. CAD status post CABG - denies any chest pain. Patient is on aspirin and metoprolol and statins. 6. Memory issues - on Aricept.   DVT prophylaxis: Heparin. Code Status: Full code.  Family Communication: Patient's son.  Disposition Plan: To be determined.  Consults called: Neurosurgery.  Admission status: Inpatient.    Rise Patience  MD [facility] Pager [phone].  If 7PM-7AM, please contact night-coverage [email or web address] Password [facility]  02/27/2016, 7:53 AM

## 2016-02-27 NOTE — Consult Note (Signed)
Reason for Consult L1 burst fracture Referring Physician: [facility]  Karen Arroyo is an 82 y.o. female.  HPI: Patient is a 82 year old female who sustained a fall from a syncopal episode last week while going to the bathroom. Since then the patient been ambulating with progressive back pain. Denies any pain into her legs denies any numbness and tingling in her feet denies any new difficulties with her bowel or bladder she apparently has some chronic issues with bladder incontinence.  Past Medical History:  Diagnosis Date  . Coronary artery disease   . Diabetes mellitus   . Diastolic dysfunction   . Dyslipidemia   . Edema   . HX: long term anticoagulant use   . MI, old 15   involving small LCX  . PAF (paroxysmal atrial fibrillation) ([place])   . Pulmonary hypertension   . S/P CABG (coronary artery bypass graft) 2000  . Skin cancer    nose, leg    Past Surgical History:  Procedure Laterality Date  . CARDIAC CATHETERIZATION  1995   dr Oran Rein  . CORONARY ARTERY BYPASS GRAFT  2000   LIMA to LAD; SVG to intermediate, SVG to PD and RCA per Dr. Servando Snare  . OTHER SURGICAL HISTORY  1999   historectomy  . TUBAL LIGATION  1961    Family History  Problem Relation Age of Onset  . Heart attack Mother   . Transient ischemic attack Father     Social History:  reports that she has never smoked. She has never used smokeless tobacco. She reports that she drinks about 1.8 oz of alcohol per week . She reports that she does not use drugs.  Allergies:  Allergies  Allergen Reactions  . Aspirin     Rash - only in high doses  . Dimetapp Cold-Allergy     Rash   . Ilosone [Erythromycin Estolate]     rash  . Morphine And Related     rash  . Penicillins     Rash Has patient had a PCN reaction causing immediate rash, facial/tongue/throat swelling, SOB or lightheadedness with hypotension: YES Has patient had a PCN reaction causing severe rash involving mucus membranes or skin  necrosis:NO Has patient had a PCN reaction that required hospitalization NO Has patient had a PCN reaction occurring within the last 10 years:NO If all of the above answers are "NO", then may proceed with Cephalosporin use.  Marland Kitchen Restoril     rash  . Zolpidem Tartrate     rash    Medications: I have reviewed the patient's current medications.  Results for orders placed or performed during the hospital encounter of 02/26/16 (from the past 48 hour(s))  Urinalysis, Routine w reflex microscopic (not at [facility])     Status: Abnormal   Collection Time: 02/26/16 11:09 PM  Result Value Ref Range   Color, Urine YELLOW YELLOW   APPearance CLEAR CLEAR   Specific Gravity, Urine 1.007 1.005 - 1.030   pH 6.0 5.0 - 8.0   Glucose, UA 100 (A) NEGATIVE mg/dL   Hgb urine dipstick NEGATIVE NEGATIVE   Bilirubin Urine NEGATIVE NEGATIVE   Ketones, ur NEGATIVE NEGATIVE mg/dL   Protein, ur NEGATIVE NEGATIVE mg/dL   Nitrite NEGATIVE NEGATIVE   Leukocytes, UA NEGATIVE NEGATIVE    Comment: MICROSCOPIC NOT DONE ON URINES WITH NEGATIVE PROTEIN, BLOOD, LEUKOCYTES, NITRITE, OR GLUCOSE <1000 mg/dL.  CBC with Differential/Platelet     Status: None   Collection Time: 02/26/16 11:24 PM  Result Value Ref Range  WBC 6.2 4.0 - 10.5 K/uL   RBC 4.58 3.87 - 5.11 MIL/uL   Hemoglobin 13.4 12.0 - 15.0 g/dL   HCT 39.1 36.0 - 46.0 %   MCV 85.4 78.0 - 100.0 fL   MCH 29.3 26.0 - 34.0 pg   MCHC 34.3 30.0 - 36.0 g/dL   RDW 14.0 11.5 - 15.5 %   Platelets 206 150 - 400 K/uL   Neutrophils Relative % 71 %   Neutro Abs 4.4 1.7 - 7.7 K/uL   Lymphocytes Relative 19 %   Lymphs Abs 1.2 0.7 - 4.0 K/uL   Monocytes Relative 8 %   Monocytes Absolute 0.5 0.1 - 1.0 K/uL   Eosinophils Relative 1 %   Eosinophils Absolute 0.1 0.0 - 0.7 K/uL   Basophils Relative 1 %   Basophils Absolute 0.0 0.0 - 0.1 K/uL  Comprehensive metabolic panel     Status: Abnormal   Collection Time: 02/26/16 11:24 PM  Result Value Ref Range   Sodium 136 135 -  145 mmol/L   Potassium 2.9 (L) 3.5 - 5.1 mmol/L   Chloride 100 (L) 101 - 111 mmol/L   CO2 24 22 - 32 mmol/L   Glucose, Bld 296 (H) 65 - 99 mg/dL   BUN 14 6 - 20 mg/dL   Creatinine, Ser 1.11 (H) 0.44 - 1.00 mg/dL   Calcium 9.2 8.9 - 10.3 mg/dL   Total Protein 6.1 (L) 6.5 - 8.1 g/dL   Albumin 3.7 3.5 - 5.0 g/dL   AST 19 15 - 41 U/L   ALT 14 14 - 54 U/L   Alkaline Phosphatase 83 38 - 126 U/L   Total Bilirubin 0.7 0.3 - 1.2 mg/dL   GFR calc non Af Amer 46 (L) >60 mL/min   GFR calc Af Amer 53 (L) >60 mL/min    Comment: (NOTE) The eGFR has been calculated using the CKD EPI equation. This calculation has not been validated in all clinical situations. eGFR's persistently <60 mL/min signify possible Chronic Kidney Disease.    Anion gap 12 5 - 15  Glucose, capillary     Status: Abnormal   Collection Time: 02/27/16  8:08 AM  Result Value Ref Range   Glucose-Capillary 202 (H) 65 - 99 mg/dL    Ct Lumbar Spine Wo Contrast  Result Date: 02/27/2016 CLINICAL DATA:  82 y/o  F; back pain after a fall. EXAM: CT LUMBAR SPINE WITHOUT CONTRAST TECHNIQUE: Multidetector CT imaging of the lumbar spine was performed without intravenous contrast administration. Multiplanar CT image reconstructions were also generated. COMPARISON:  CT abdomen and pelvis 07/09/2005 FINDINGS: Segmentation: 5 lumbar type vertebrae. Alignment: Normal. Vertebrae: Moderate L1 superior endplate compression deformity within acute appearance. There is retropulsion of the posterior margin and lateral margins of the superior endplate into the central, left subarticular, left foraminal, and left extra foraminal zones. Mild resultant bony canal stenosis. No other fracture is identified. Paraspinal and other soft tissues: Severe calcific atherosclerosis of the abdominal aorta. Gallstones. There is mild paraspinal edema at the level of the L1 vertebral body. No hematoma identified. Punctate densities within the right renal hilum probably  represent vascular calcifications, less likely nephrolithiasis. Disc levels: Lumbar spondylosis and facet arthropathy without high-grade bony canal stenosis or foraminal narrowing. IMPRESSION: Moderate L1 superior endplate compression deformity with acute appearance and retropulsion of the posterior aspect of the superior endplate into the spinal canal with mild resultant bony canal stenosis. MRI can better assess degree of neural impingement if clinically indicated. No other  fracture identified. Electronically Signed   By: Kristine Garbe M.D.   On: 02/27/2016 00:31   Mr Lumbar Spine Wo Contrast  Result Date: 02/27/2016 CLINICAL DATA:  82 y/o  F; L1 compression deformity. EXAM: MRI LUMBAR SPINE WITHOUT CONTRAST TECHNIQUE: Multiplanar, multisequence MR imaging of the lumbar spine was performed. No intravenous contrast was administered. COMPARISON:  02/26/2016 lumbar CT. FINDINGS: Segmentation:  Standard. Alignment:  Physiologic. Vertebrae: Superior endplate deformity of L1 vertebral body with moderate loss of height and mild retropulsion of the posterior aspect of the superior endplate. There is edema within the vertebral body extending into the pedicles. No evidence for additional fracture, diskitis, or suspicious osseous lesion. Prominent S2 Tarlov cysts. Conus medullaris: Extends to the L1-2 level and appears normal. Paraspinal and other soft tissues: Left kidney lower pole T2 hyperintense structure measuring 18 mm probably representing a cyst. Disc levels: T12-L1: The retropulsed superior endplate of L1 effaces the anterior thecal sac. There is no cord impingement or significant canal stenosis. Neural foramen are patent. L1-2: No significant disc displacement, foraminal narrowing, or canal stenosis. L2-3: Minimal disc bulge without significant foraminal narrowing or canal stenosis. L3-4: Minimal disc bulge without significant foraminal narrowing or canal stenosis. L4-5: Small disc bulge and mild  facet/ligamentum flavum hypertrophy. Mild bilateral foraminal and lateral recess narrowing. No significant canal stenosis. Trace facet effusions. L5-S1: Minimal disc bulge mild bilateral facet hypertrophy. No significant foraminal narrowing or canal stenosis. IMPRESSION: L1 superior end plate acute moderate compression deformity. Mild retropulsion of the superior endplate effaces the anterior thecal sac of the spinal canal at that level without significant canal stenosis. There is no evidence for neural impingement. Electronically Signed   By: Kristine Garbe M.D.   On: 02/27/2016 05:08    Review of Systems  Musculoskeletal: Positive for back pain and myalgias.  All other systems reviewed and are negative.  Blood pressure (!) 187/86, pulse 77, temperature 98.7 F (37.1 C), temperature source Oral, resp. rate 20, height 5' 4"  (1.626 m), weight 60.4 kg (133 lb 3.2 oz), SpO2 97 %. Physical Exam  Constitutional: She is oriented to person, place, and time.  Neurological: She is alert and oriented to person, place, and time. She has normal strength. GCS eye subscore is 4. GCS verbal subscore is 5. GCS motor subscore is 6.  Awake alert pupils equal strength 5 out of 5 lower extremities iliopsoas, quads, hip she's, gastric, into tibialis, and EHL.    Assessment/Plan: 82 year old female with an L1 burst fracture mild burst component moderate compression fracture component with loss of height and no significant angulation or kyphosis. Certainly in the setting of an 82 year old with multiple medical problems I recommended bracing so we'll obtain upright x-rays in her brace and if his upper x-ray shows stable alignment she can be mobilized with physical occupational therapy.  Lochlann Mastrangelo P 02/27/2016, 10:27 AM

## 2016-02-27 NOTE — Progress Notes (Signed)
Interval Note  [facility]  Patient seen and examined this morning at bedside. Patient complaining of back pain. Pain medication helping. No other complaints. She was seen by neurosurgery who recommended conservative treatment given multiple medical issues and age. Recommended brace and get x-ray if there is alignment patient can be mobilized and have physical therapy.  Patient denies fecal incontinence or lower extremity weakness. Patient with baseline with urinary incontinence, not worse.  BP 149/83 Vital signs stable Physical exam Appears calm and comfortable in bed No extremity weakness, no numbness or tingling sensation.  A&P Complexion burst fracture on the L1 vertebral Neurologically intact. Neurosurgery recommendations appreciated Brace TLSO Repeat x-ray with brace in place. Alignment patient can participate in physical therapy and mobilize. No surgical interventions recommended at this time. If there is alignment with brace likely to be discharged home with physical therapy.  Hypokalemia K 2.9 Replete Check magnesium BMP in the a.m. Check EKG  Hypertension BP in the borderline high Likely secondary to pain Pain control when necessary Continue home medications   For full analysis and plan refer to H&P  Chipper Oman, MD

## 2016-02-27 NOTE — Progress Notes (Signed)
[facility] for Heparin Indication: atrial fibrillation  Allergies  Allergen Reactions  . Aspirin     Rash - only in high doses  . Dimetapp Cold-Allergy     Rash   . Ilosone [Erythromycin Estolate]     rash  . Morphine And Related     rash  . Penicillins     Rash Has patient had a PCN reaction causing immediate rash, facial/tongue/throat swelling, SOB or lightheadedness with hypotension: YES Has patient had a PCN reaction causing severe rash involving mucus membranes or skin necrosis:NO Has patient had a PCN reaction that required hospitalization NO Has patient had a PCN reaction occurring within the last 10 years:NO If all of the above answers are "NO", then may proceed with Cephalosporin use.  Marland Kitchen Restoril     rash  . Zolpidem Tartrate     rash    Patient Measurements: Height: 5\' 4"  (162.6 cm) Weight: 133 lb 3.2 oz (60.4 kg) IBW/kg (Calculated) : 54.7 Heparin Dosing Weight: 60.4 kg  Vital Signs: Temp: 98.1 F (36.7 C) (11/24 1320) Temp Source: Oral (11/24 1320) BP: 149/83 (11/24 1320) Pulse Rate: 77 (11/24 1320)   Assessment: 82 yo F presents on 11/23 with low back pain. Has a history of Afib on Xarelto at home. Last dose of Xarelto taken on 11/23 and unsure of what time but earlier in the day.  Pharmacy asked to dose heparin gtt until decision on procedures made- appears Neurosurgery will not be pursuing procedure, but follow for final decision.   APTT in range tonight at 78 seconds. No bleeding noted, CBC within normal limits this morning.  Goal of Therapy:  APTT 66-102 Monitor platelets by anticoagulation protocol: Yes   Plan:  Continue heparin gtt at 900 units/hr Monitor daily heparin level and aPTT until correlating Daily CBC Follow for signs/symptoms of bleed  Oneka Parada D. Donya Hitch, PharmD, BCPS Clinical Pharmacist Pager: [phone] 02/27/2016 9:28 PM

## 2016-02-27 NOTE — Care Management Note (Signed)
Case Management Note  Patient Details  Name: Karen Arroyo MRN: [id number] Date of Birth: January 19, 1934  Subjective/Objective:                    Action/Plan: CM met with the patient, her husband, and her son. CM inquired about the plan at discharge. Per the husband the patient and he live alone and do not have any assistance. The son is from out of town and has tried to get his parents to move into his home with resistance. The patient's husband is interested in taking her home but not sure of the care she will need. Son does not want to further discuss d/c plans until he has spoken with the MD and knows her limitations and needs. PT is also ordered to see pt. CM following.   Expected Discharge Date:                  Expected Discharge Plan:     In-House Referral:     Discharge planning Services     Post Acute Care Choice:    Choice offered to:     DME Arranged:    DME Agency:     HH Arranged:    HH Agency:     Status of Service:     If discussed at [employer], dates discussed:    Additional Comments:  Pollie Friar, RN 02/27/2016, 2:53 PM

## 2016-02-27 NOTE — Progress Notes (Signed)
Initial Nutrition Assessment  DOCUMENTATION CODES:   Not applicable  INTERVENTION:   -Glucerna Shake po BID, each supplement provides 220 kcal and 10 grams of protein  NUTRITION DIAGNOSIS:   Inadequate oral intake related to poor appetite as evidenced by per patient/family report.  GOAL:   Patient will meet greater than or equal to 90% of their needs  MONITOR:   PO intake, Supplement acceptance, Labs, Weight trends, Skin, I & O's  REASON FOR ASSESSMENT:   Malnutrition Screening Tool    ASSESSMENT:   Karen Arroyo is a 82 y.o. female with history of CAD status post CABG, chronic atrial fibrillation, diabetes mellitus, memory issues presents to the ER because of persistent low back pain since fall last week.  Pt admitted with compression fx of the L1 vertabra with retropulsion. Neurosurgery recommending bracing.   Spoke with pt at bedside, who only provided minimal hx. She answered most questions with "I just don't know". She reports that she has had a poor appetite for an unspecific amount of time. Pt consumed about one peanut butter cracker and a few sips of soft drink at bedside. She shares that she was using supplements 1-2 times daily PTA to assist with adequate nutrition.   Pt shares she has lost weight, however, unable to provide details. Reviewed documented wt hx; pt appears fairly stable. No significant wt changes noted per time frame.   Nutrition-Focused physical exam completed. Findings are mild fat depletion, mild muscle depletion, and no edema. Pt admits to limited mobility due to pain ("I broke my back last week and didn't know it"). Suspect depletions (found on upper and lower extremities) are related to decline in mobility and advanced age.   Discussed importance of good meal intake to promote healing. Pt amenable to supplements; RD to order.   Labs reviewed: K: 2.9, CBG: 170-202.   Diet Order:  Diet heart healthy/carb modified Room service appropriate? Yes;  Fluid consistency: Thin  Skin:  Reviewed, no issues  Last BM:  02/26/16  Height:   Ht Readings from Last 1 Encounters:  02/27/16 5\' 4"  (1.626 m)    Weight:   Wt Readings from Last 1 Encounters:  02/27/16 133 lb 3.2 oz (60.4 kg)    Ideal Body Weight:  54.5 kg  BMI:  Body mass index is 22.86 kg/m.  Estimated Nutritional Needs:   Kcal:  1500-1700  Protein:  70-85 grams  Fluid:  1.5-1.7 L  EDUCATION NEEDS:   Education needs addressed  Amalio Loe A. Jimmye Norman, RD, LDN, CDE Pager: [phone] After hours Pager: [phone]

## 2016-02-27 NOTE — Care Management Obs Status (Signed)
[facility] NOTIFICATION   Patient Details  Name: Karen Arroyo MRN: [id number] Date of Birth: 07/13/1933   Medicare Observation Status Notification Given:  Yes    Pollie Friar, RN 02/27/2016, 2:52 PM

## 2016-02-27 NOTE — Progress Notes (Addendum)
ANTICOAGULATION CONSULT NOTE - Initial Consult  Pharmacy Consult for Heparin Indication: atrial fibrillation  Allergies  Allergen Reactions  . Aspirin     Rash - only in high doses  . Dimetapp Cold-Allergy     Rash   . Ilosone [Erythromycin Estolate]     rash  . Morphine And Related     rash  . Penicillins     Rash Has patient had a PCN reaction causing immediate rash, facial/tongue/throat swelling, SOB or lightheadedness with hypotension: YES Has patient had a PCN reaction causing severe rash involving mucus membranes or skin necrosis:NO Has patient had a PCN reaction that required hospitalization NO Has patient had a PCN reaction occurring within the last 10 years:NO If all of the above answers are "NO", then may proceed with Cephalosporin use.  Marland Kitchen Restoril     rash  . Zolpidem Tartrate     rash    Patient Measurements: Height: 5\' 4"  (162.6 cm) Weight: 133 lb 3.2 oz (60.4 kg) IBW/kg (Calculated) : 54.7 Heparin Dosing Weight: 60.4 kg  Vital Signs: Temp: 98.7 F (37.1 C) (11/24 0545) Temp Source: Oral (11/24 0545) BP: 187/86 (11/24 0500) Pulse Rate: 77 (11/24 0500)   Assessment: 82 yo F presents on 11/23 with low back pain. Has a history of Afib on Xarelto at home. Last dose of Xarelto taken on 11/23 and unsure of what time but earlier in the day. Neurosurgery be consulted to see if any procedure will be down so pharmacy consulted to start heparin. CBC stable, no s/s of bleed.   Goal of Therapy:  APTT 66-102 Monitor platelets by anticoagulation protocol: Yes   Plan:  No heparin bolus Start heparin gtt at 900 units/hr at noon today Check 8 hr aPTT Monitor daily HL / aPTT, CBC, s/s of bleed   Elenor Quinones, PharmD, [facility] Clinical Pharmacist Pager [phone] 02/27/2016 8:08 AM

## 2016-02-27 NOTE — Progress Notes (Signed)
Orthopedic Tech Progress Note Patient Details:  Karen Arroyo [birth year] [id number]  Patient ID: Karen Arroyo, female   DOB: [birth year], 82 y.o.   MRN: [id number]   Karen Arroyo 02/27/2016, 9:23 AMCalled [employer] for TLSO brace.

## 2016-02-28 ENCOUNTER — Other Ambulatory Visit: Payer: Self-pay

## 2016-02-28 DIAGNOSIS — I10 Essential (primary) hypertension: Secondary | ICD-10-CM | POA: Diagnosis not present

## 2016-02-28 DIAGNOSIS — I482 Chronic atrial fibrillation: Secondary | ICD-10-CM | POA: Diagnosis not present

## 2016-02-28 DIAGNOSIS — R748 Abnormal levels of other serum enzymes: Secondary | ICD-10-CM | POA: Diagnosis not present

## 2016-02-28 DIAGNOSIS — R079 Chest pain, unspecified: Secondary | ICD-10-CM | POA: Diagnosis not present

## 2016-02-28 DIAGNOSIS — I214 Non-ST elevation (NSTEMI) myocardial infarction: Secondary | ICD-10-CM | POA: Diagnosis not present

## 2016-02-28 DIAGNOSIS — I480 Paroxysmal atrial fibrillation: Secondary | ICD-10-CM | POA: Diagnosis not present

## 2016-02-28 DIAGNOSIS — S32010D Wedge compression fracture of first lumbar vertebra, subsequent encounter for fracture with routine healing: Secondary | ICD-10-CM | POA: Diagnosis not present

## 2016-02-28 LAB — TROPONIN I
TROPONIN I: 0.03 ng/mL — AB (ref <0.03)
TROPONIN I: 5.14 ng/mL — AB (ref <0.03)
Troponin I: 2.23 ng/mL (ref <0.03)
Troponin I: 4.15 ng/mL (ref <0.03)

## 2016-02-28 LAB — BASIC METABOLIC PANEL
Anion gap: 8 (ref 5–15)
BUN: 10 mg/dL (ref 6–20)
CHLORIDE: 106 mmol/L (ref 101–111)
CO2: 23 mmol/L (ref 22–32)
Calcium: 9.1 mg/dL (ref 8.9–10.3)
Creatinine, Ser: 0.81 mg/dL (ref 0.44–1.00)
GFR calc non Af Amer: >60 mL/min (ref >60)
Glucose, Bld: 178 mg/dL — ABNORMAL HIGH (ref 65–99)
POTASSIUM: 4.9 mmol/L (ref 3.5–5.1)
SODIUM: 137 mmol/L (ref 135–145)

## 2016-02-28 LAB — GLUCOSE, CAPILLARY
GLUCOSE-CAPILLARY: 186 mg/dL — AB (ref 65–99)
GLUCOSE-CAPILLARY: 200 mg/dL — AB (ref 65–99)
GLUCOSE-CAPILLARY: 157 mg/dL — AB (ref 65–99)
Glucose-Capillary: 327 mg/dL — ABNORMAL HIGH (ref 65–99)

## 2016-02-28 LAB — CBC
HEMATOCRIT: 38.5 % (ref 36.0–46.0)
HEMOGLOBIN: 12.9 g/dL (ref 12.0–15.0)
MCH: 28.9 pg (ref 26.0–34.0)
MCHC: 33.5 g/dL (ref 30.0–36.0)
MCV: 86.1 fL (ref 78.0–100.0)
Platelets: 216 10*3/uL (ref 150–400)
RBC: 4.47 MIL/uL (ref 3.87–5.11)
RDW: 14.5 % (ref 11.5–15.5)
WBC: 6.7 10*3/uL (ref 4.0–10.5)

## 2016-02-28 LAB — CK TOTAL AND CKMB (NOT AT ARMC)
CK TOTAL: 36 U/L — AB (ref 38–234)
CK, MB: 2.3 ng/mL (ref 0.5–5.0)
RELATIVE INDEX: INVALID (ref 0.0–2.5)

## 2016-02-28 LAB — HEMOGLOBIN A1C
Hgb A1c MFr Bld: 9.3 % — ABNORMAL HIGH (ref 4.8–5.6)
Mean Plasma Glucose: 220 mg/dL

## 2016-02-28 LAB — MAGNESIUM: MAGNESIUM: 1.8 mg/dL (ref 1.7–2.4)

## 2016-02-28 LAB — APTT: aPTT: 82 seconds — ABNORMAL HIGH (ref 24–36)

## 2016-02-28 LAB — HEPARIN LEVEL (UNFRACTIONATED): Heparin Unfractionated: 0.77 IU/mL — ABNORMAL HIGH (ref 0.30–0.70)

## 2016-02-28 MED ORDER — HYDRALAZINE HCL 20 MG/ML IJ SOLN
20.0000 mg | Freq: Four times a day (QID) | INTRAMUSCULAR | Status: DC | PRN
  Administered 2016-02-28: 20 mg via INTRAVENOUS
  Filled 2016-02-28: qty 1

## 2016-02-28 MED ORDER — LOPERAMIDE HCL 2 MG PO CAPS
4.0000 mg | ORAL_CAPSULE | ORAL | Status: DC | PRN
  Administered 2016-02-28: 4 mg via ORAL
  Filled 2016-02-28: qty 2

## 2016-02-28 MED ORDER — METOPROLOL TARTRATE 25 MG PO TABS
25.0000 mg | ORAL_TABLET | Freq: Three times a day (TID) | ORAL | Status: DC
  Administered 2016-02-28 – 2016-02-29 (×3): 25 mg via ORAL
  Filled 2016-02-28 (×3): qty 1

## 2016-02-28 MED ORDER — METOPROLOL TARTRATE 25 MG PO TABS: 25.0000 mg | ORAL_TABLET | Freq: Two times a day (BID) | ORAL | Status: DC

## 2016-02-28 MED ORDER — DILTIAZEM HCL 25 MG/5ML IV SOLN
5.0000 mg | Freq: Once | INTRAVENOUS | Status: AC
Start: 2016-02-28 — End: 2016-02-28
  Administered 2016-02-28: 5 mg via INTRAVENOUS
  Filled 2016-02-28: qty 5

## 2016-02-28 MED ORDER — OXYCODONE HCL 5 MG PO TABS
5.0000 mg | ORAL_TABLET | ORAL | Status: DC | PRN
  Administered 2016-02-28 – 2016-03-01 (×9): 5 mg via ORAL
  Filled 2016-02-28 (×9): qty 1

## 2016-02-28 NOTE — NC FL2 (Signed)
[place] LEVEL OF CARE SCREENING TOOL     IDENTIFICATION  Patient Name: Karen Arroyo Birthdate: 12-23-1933 Sex: female Admission Date (Current Location): 02/26/2016  [facility] and [place] Number:  Herbalist and Address:  [facility] [place] [address], [place], [place] [address]      Provider Number: [id number]  Attending Physician Name and Address:  Doreatha Lew, MD  Relative Name and Phone Number:       Current Level of Care: Hospital Recommended Level of Care: [place] Prior Approval Number:    Date Approved/Denied: 02/28/16 PASRR Number: [id number]  Discharge Plan: SNF    Current Diagnoses: Patient Active Problem List   Diagnosis Date Noted  . Compression fracture of first lumbar vertebra ([place]) 02/27/2016  . Lumbar compression fracture ([place]) 02/27/2016  . Hx of CABG 01/07/2016  . Mild cognitive impairment 06/05/2015  . Essential hypertension 06/05/2015  . Hyperlipidemia 06/05/2015  . Type 2 diabetes mellitus without complication, without long-term current use of insulin ([place]) 06/05/2015  . Syncope and collapse   . AKI (acute kidney injury) ([place]) 04/26/2015  . Syncope 04/26/2015  . PAF (paroxysmal atrial fibrillation) ([place])   . Diabetes ([place])   . Encounter for therapeutic drug monitoring 05/02/2013  . Malaise and fatigue 12/14/2010  . Chest pain 11/25/2010  . Atrial fibrillation ([place])   . Coronary artery disease   . Long term (current) use of anticoagulants   . Type II or unspecified type diabetes mellitus without mention of complication, uncontrolled   . Dyslipidemia   . Ischemia of heart, chronic   . Diastolic dysfunction   . Pulmonary hypertension ([facility])     Orientation RESPIRATION BLADDER Height & Weight     Self, Time, Place  O2 (Nasual Cannula) Continent Weight: 133 lb 3.2 oz (60.4 kg) Height:  5\' 4"  (162.6 cm)  BEHAVIORAL SYMPTOMS/MOOD NEUROLOGICAL BOWEL NUTRITION STATUS       Continent Diet (Heart healthy/ carb modified; thin liquids)  AMBULATORY STATUS COMMUNICATION OF NEEDS Skin   Limited Assist Verbally Normal                       Personal Care Assistance Level of Assistance  Bathing, Feeding, Dressing Bathing Assistance: Limited assistance Feeding assistance: Independent Dressing Assistance: Limited assistance     Functional Limitations Info  Sight, Hearing, Speech Sight Info: Adequate Hearing Info: Adequate Speech Info: Adequate    SPECIAL CARE FACTORS FREQUENCY  PT (By licensed PT), OT (By licensed OT)     PT Frequency: 3x week OT Frequency: 3x week            Contractures Contractures Info: Not present    Additional Factors Info  Code Status, Allergies Code Status Info: Full  Allergies Info: Aspirin, Dimetapp Cold-allergy, Ilosone Erythromycin Estolate, Morphine And Related, Penicillins, Restoril, Zolpidem Tartrate           Current Medications (02/28/2016):  This is the current hospital active medication list Current Facility-Administered Medications  Medication Dose Route Frequency Provider Last Rate Last Dose  . acetaminophen (TYLENOL) tablet 650 mg  650 mg Oral Q6H PRN Rise Patience, MD   650 mg at 02/28/16 0731   Or  . acetaminophen (TYLENOL) suppository 650 mg  650 mg Rectal Q6H PRN Rise Patience, MD      . aspirin EC tablet 81 mg  81 mg Oral QHS Rise Patience, MD   81 mg at 02/27/16  2143  . atorvastatin (LIPITOR) tablet 10 mg  10 mg Oral Daily Rise Patience, MD   10 mg at 02/28/16 1037  . cholecalciferol (VITAMIN D) tablet 2,000 Units  2,000 Units Oral QHS Rise Patience, MD   2,000 Units at 02/27/16 2143  . citalopram (CELEXA) tablet 20 mg  20 mg Oral QPM Rise Patience, MD   20 mg at 02/27/16 1806  . donepezil (ARICEPT) tablet 10 mg  10 mg Oral Daily Rise Patience, MD   10 mg at 02/28/16 1037  . feeding supplement (GLUCERNA SHAKE) (GLUCERNA SHAKE) liquid 237 mL  237 mL Oral  BID BM Doreatha Lew, MD   237 mL at 02/28/16 1400  . fentaNYL (SUBLIMAZE) injection 50 mcg  50 mcg Intravenous Q2H PRN Rise Patience, MD   50 mcg at 02/28/16 0847  . heparin ADULT infusion 100 units/mL (25000 units/254mL sodium chloride 0.45%)  900 Units/hr Intravenous Continuous Reginia Naas, RPH 9 mL/hr at 02/28/16 1442 900 Units/hr at 02/28/16 1442  . hydrALAZINE (APRESOLINE) injection 20 mg  20 mg Intravenous Q6H PRN Lily Kocher, MD   20 mg at 02/28/16 0732  . insulin aspart (novoLOG) injection 0-9 Units  0-9 Units Subcutaneous TID WC Rise Patience, MD   2 Units at 02/28/16 1304  . loperamide (IMODIUM) capsule 4 mg  4 mg Oral PRN Doreatha Lew, MD   4 mg at 02/28/16 1555  . loratadine (CLARITIN) tablet 10 mg  10 mg Oral Daily Rise Patience, MD   10 mg at 02/28/16 1037  . methocarbamol (ROBAXIN) tablet 500 mg  500 mg Oral Q8H PRN Rise Patience, MD   500 mg at 02/28/16 0732  . metoprolol tartrate (LOPRESSOR) tablet 25 mg  25 mg Oral BID Doreatha Lew, MD      . mirabegron [facility] tablet 25 mg  25 mg Oral Daily Rise Patience, MD   25 mg at 02/28/16 1037  . ondansetron (ZOFRAN) injection 4 mg  4 mg Intravenous Q6H PRN Rise Patience, MD   4 mg at 02/28/16 [id number]  . ondansetron (ZOFRAN) tablet 4 mg  4 mg Oral Q6H PRN Rise Patience, MD       Or  . ondansetron [facility]) injection 4 mg  4 mg Intravenous Q6H PRN Rise Patience, MD      . oxyCODONE (Oxy IR/ROXICODONE) immediate release tablet 5 mg  5 mg Oral Q4H PRN Doreatha Lew, MD   5 mg at 02/28/16 1556  . potassium chloride (K-DUR,KLOR-CON) CR tablet 10 mEq  10 mEq Oral Daily Rise Patience, MD   10 mEq at 02/28/16 1037     Discharge Medications: Please see discharge summary for a list of discharge medications.  Relevant Imaging Results:  Relevant Lab Results:   Additional Information SSN: [id number]  Alla German, LCSW

## 2016-02-28 NOTE — Progress Notes (Signed)
PROGRESS NOTE [facility]   Karen Arroyo   [id number] DOB: 08-21-1933  DOA: 02/26/2016 PCP: Tivis Ringer, MD   Brief Narrative:  82 y/o F with PMHx of CAD, s/p CABG, Chronic Afib, DM type II, Dementia presented to ED with persistent back pain since a fall last week. Patient had  A syncopal episode causing the fall, in the ER had a CT which was unremarkable and was discharge home on Robaxin for pain. Pain got worse came to the ED and lumbar xray revealed a L1 compression fracture, Neurosurgery was consulted whom recommended conservative treatment with TLSO. Repeat xray shows better alignment when patient wearing the brace. On day 2 of hospital stay patient developed chest pain, SOB and went into Afib with RVR with HR up to 160's, Cardizem was given and cardiac enzymes were obtained.   Subjective: Patient seen and examined with son at bedside. Patient c/o of SOB, and chest tightness, also complaining of back and b/l arm pain. Patient's son report that her dementia is more predominant today. Patient is alert and oriented x 3. Afebrile.   Assessment & Plan: Afib with RVR - rates when up to the 130's-160's Cardizem x 1 given - rate was controlled - patient remained in afib Metoprolol dose was increased to 25 mg BID  Cardio consult  Continue tele monitoring  L1 Compression Fracture - xray with brace better alignment  Conservative treatment with TLSO brace  Neurosurgery recommendations appreciated  Wear brace most time even when lying down in bed  Pain control as needed   Hypertension - slight elevated possible due to pain  BB increased Monitor BP  Hydralazine PRN   DM type 2 - CBG's stable  Continue SSI  Monitor CBG   Dementia - per son slight worsen - most likely due to delirium from pain Pain control  Increase room lighting   CAD s/p CABG - complaining of chest pain, EKG with slight downward ST on the lateral leads, TNI's trending up  NSTEMI  On ASA and heparin  drip  O2 PRN  Metoprolol increased to 25mg  BID  Cardiology consulted  Trend TNI's repeat EKG    DVT prophylaxis:  Heparin Drip  Code Status: Full  Family Communication: Son at bedside  Disposition Plan: Home vs SNF when medically stable   Consultants:   Neurosurgery   Procedures:   None   Antimicrobials:  None    Objective: Vitals:   02/27/16 2122 02/28/16 0635 02/28/16 0814 02/28/16 0835  BP: (!) 165/88 (!) 176/100 (!) 141/76 137/62  Pulse: 96 93 (!) 129 94  Resp: 18 18    Temp: 98.5 F (36.9 C) 98.5 F (36.9 C)    TempSrc: Oral Oral    SpO2: 94% 94%  99%  Weight:      Height:        Intake/Output Summary (Last 24 hours) at 02/28/16 0909 Last data filed at 02/28/16 0135  Gross per 24 hour  Intake           991.67 ml  Output              400 ml  Net           591.67 ml   Filed Weights   02/26/16 1927 02/27/16 0545  Weight: 59 kg (130 lb) 60.4 kg (133 lb 3.2 oz)    Examination:  General exam: Mild distress due to pain  Respiratory system: Clear to auscultation. No wheezes,crackle or rhonchi Cardiovascular system: S1 &  S2 heard, IRR IRR @ 128. No JVD, + systolic murmurs, no rubs or gallop Gastrointestinal system: Soft NT ND +BS Central nervous system: Alert and oriented. No focal neurological deficits. Extremities: No pedal edema.  Skin: No rashes, lesions or ulcers Psychiatry: Pleasantly with dementia    Data Reviewed: I have personally reviewed following labs and imaging studies  CBC:  Recent Labs Lab 02/26/16 2324 02/27/16 1048 02/28/16 0343  WBC 6.2 8.5 6.7  NEUTROABS 4.4 6.9  --   HGB 13.4 13.2 12.9  HCT 39.1 38.9 38.5  MCV 85.4 85.7 86.1  PLT 206 209 [id number]   Basic Metabolic Panel:  Recent Labs Lab 02/26/16 2324 02/27/16 1048 02/28/16 0343  NA 136 138 137  K 2.9* 2.9* 4.9  CL 100* 101 106  CO2 24 29 23   GLUCOSE 296* 162* 178*  BUN 14 8 10   CREATININE 1.11* 0.86 0.81  CALCIUM 9.2 9.2 9.1  MG  --   --  1.8    GFR: Estimated Creatinine Clearance: 47.8 mL/min (by C-G formula based on SCr of 0.81 mg/dL). Liver Function Tests:  Recent Labs Lab 02/26/16 2324 02/27/16 1048  AST 19 20  ALT 14 16  ALKPHOS 83 74  BILITOT 0.7 1.1  PROT 6.1* 5.9*  ALBUMIN 3.7 3.6   No results for input(s): LIPASE, AMYLASE in the last 168 hours. No results for input(s): AMMONIA in the last 168 hours. Coagulation Profile: No results for input(s): INR, PROTIME in the last 168 hours. Cardiac Enzymes: No results for input(s): CKTOTAL, CKMB, CKMBINDEX, TROPONINI in the last 168 hours. BNP (last 3 results) No results for input(s): PROBNP in the last 8760 hours. HbA1C:  Recent Labs  02/27/16 1048  HGBA1C 9.3*   CBG:  Recent Labs Lab 02/27/16 0808 02/27/16 1258 02/27/16 1757 02/27/16 2125 02/28/16 0744  GLUCAP 202* 170* 251* 140* 186*   Lipid Profile: No results for input(s): CHOL, HDL, LDLCALC, TRIG, CHOLHDL, LDLDIRECT in the last 72 hours. Thyroid Function Tests: No results for input(s): TSH, T4TOTAL, FREET4, T3FREE, THYROIDAB in the last 72 hours. Anemia Panel: No results for input(s): VITAMINB12, FOLATE, FERRITIN, TIBC, IRON, RETICCTPCT in the last 72 hours. Sepsis Labs: No results for input(s): PROCALCITON, LATICACIDVEN in the last 168 hours.  No results found for this or any previous visit (from the past 240 hour(s)).     Radiology Studies: Ct Lumbar Spine Wo Contrast  Result Date: 02/27/2016 CLINICAL DATA:  82 y/o  F; back pain after a fall. EXAM: CT LUMBAR SPINE WITHOUT CONTRAST TECHNIQUE: Multidetector CT imaging of the lumbar spine was performed without intravenous contrast administration. Multiplanar CT image reconstructions were also generated. COMPARISON:  CT abdomen and pelvis 07/09/2005 FINDINGS: Segmentation: 5 lumbar type vertebrae. Alignment: Normal. Vertebrae: Moderate L1 superior endplate compression deformity within acute appearance. There is retropulsion of the posterior  margin and lateral margins of the superior endplate into the central, left subarticular, left foraminal, and left extra foraminal zones. Mild resultant bony canal stenosis. No other fracture is identified. Paraspinal and other soft tissues: Severe calcific atherosclerosis of the abdominal aorta. Gallstones. There is mild paraspinal edema at the level of the L1 vertebral body. No hematoma identified. Punctate densities within the right renal hilum probably represent vascular calcifications, less likely nephrolithiasis. Disc levels: Lumbar spondylosis and facet arthropathy without high-grade bony canal stenosis or foraminal narrowing. IMPRESSION: Moderate L1 superior endplate compression deformity with acute appearance and retropulsion of the posterior aspect of the superior endplate into the spinal canal with  mild resultant bony canal stenosis. MRI can better assess degree of neural impingement if clinically indicated. No other fracture identified. Electronically Signed   By: Kristine Garbe M.D.   On: 02/27/2016 00:31   Mr Lumbar Spine Wo Contrast  Result Date: 02/27/2016 CLINICAL DATA:  82 y/o  F; L1 compression deformity. EXAM: MRI LUMBAR SPINE WITHOUT CONTRAST TECHNIQUE: Multiplanar, multisequence MR imaging of the lumbar spine was performed. No intravenous contrast was administered. COMPARISON:  02/26/2016 lumbar CT. FINDINGS: Segmentation:  Standard. Alignment:  Physiologic. Vertebrae: Superior endplate deformity of L1 vertebral body with moderate loss of height and mild retropulsion of the posterior aspect of the superior endplate. There is edema within the vertebral body extending into the pedicles. No evidence for additional fracture, diskitis, or suspicious osseous lesion. Prominent S2 Tarlov cysts. Conus medullaris: Extends to the L1-2 level and appears normal. Paraspinal and other soft tissues: Left kidney lower pole T2 hyperintense structure measuring 18 mm probably representing a cyst.  Disc levels: T12-L1: The retropulsed superior endplate of L1 effaces the anterior thecal sac. There is no cord impingement or significant canal stenosis. Neural foramen are patent. L1-2: No significant disc displacement, foraminal narrowing, or canal stenosis. L2-3: Minimal disc bulge without significant foraminal narrowing or canal stenosis. L3-4: Minimal disc bulge without significant foraminal narrowing or canal stenosis. L4-5: Small disc bulge and mild facet/ligamentum flavum hypertrophy. Mild bilateral foraminal and lateral recess narrowing. No significant canal stenosis. Trace facet effusions. L5-S1: Minimal disc bulge mild bilateral facet hypertrophy. No significant foraminal narrowing or canal stenosis. IMPRESSION: L1 superior end plate acute moderate compression deformity. Mild retropulsion of the superior endplate effaces the anterior thecal sac of the spinal canal at that level without significant canal stenosis. There is no evidence for neural impingement. Electronically Signed   By: Kristine Garbe M.D.   On: 02/27/2016 05:08   Dg Lumbar Spine 1vclearing  Result Date: 02/27/2016 CLINICAL DATA:  L1 fracture. EXAM: LIMITED LUMBAR SPINE FOR TRAUMA CLEARING - 1 VIEW COMPARISON:  Radiographs and MRI of same day. FINDINGS: Moderate compression deformity of L1 vertebral body is noted consistent with fracture. Atherosclerosis of thoracic aorta is noted. Mild diffuse osteopenia is noted. No spondylolisthesis is noted. Remaining vertebral bodies appear intact. Disc spaces are well-maintained. IMPRESSION: Aortic atherosclerosis. Moderate compression deformity of L1 vertebral body consistent with fracture. Electronically Signed   By: Marijo Conception, M.D.   On: 02/27/2016 18:01   Dg Lumbar Spine 1vclearing  Result Date: 02/27/2016 CLINICAL DATA:  Known L1 compression deformity EXAM: LIMITED LUMBAR SPINE FOR TRAUMA CLEARING - 1 VIEW COMPARISON:  CT from 02/26/2016 and MRI from 02/27/2016 FINDINGS:  L1 compression deformity is noted similar to that seen on prior CT and MRI. Slight increase kyphosis is noted at L1. Remainder of the lumbar vertebra show normal vertebral height. Mild anterolisthesis of L4 on L5 is noted. Facet hypertrophic changes are seen. Diffuse aortic calcifications are noted. IMPRESSION: L1 compression deformity similar to that noted on prior CT and MR. Degenerative changes as described. Electronically Signed   By: Inez Catalina M.D.   On: 02/27/2016 11:33      Scheduled Meds: . aspirin EC  81 mg Oral QHS  . atorvastatin  10 mg Oral Daily  . cholecalciferol  2,000 Units Oral QHS  . citalopram  20 mg Oral QPM  . diltiazem  5 mg Intravenous Once  . donepezil  10 mg Oral Daily  . feeding supplement (GLUCERNA SHAKE)  237 mL Oral BID BM  .  insulin aspart  0-9 Units Subcutaneous TID WC  . loratadine  10 mg Oral Daily  . metoprolol tartrate  25 mg Oral BID  . mirabegron ER  25 mg Oral Daily  . potassium chloride  10 mEq Oral Daily   Continuous Infusions: . heparin 900 Units/hr (02/27/16 1027)     LOS: 1 day    Chipper Oman, MD [facility] Pager [phone]  If 7PM-7AM, please contact night-coverage [email or web address] Password [email or web address] 02/28/2016, 9:09 AM

## 2016-02-28 NOTE — Progress Notes (Addendum)
CRITICAL VALUE STICKER  CRITICAL VALUE: Troponin 2.23  RECEIVER (on-site recipient of call): Hulan Amato  DATE & TIME NOTIFIED: 02/28/16 1529  MESSENGER (representative from lab):  MD NOTIFIED: Quincy Simmonds   TIME OF NOTIFICATION:02/28/16 [id number]  RESPONSE: Get another EKG now

## 2016-02-28 NOTE — Progress Notes (Signed)
ANTICOAGULATION CONSULT NOTE - Initial Consult  Pharmacy Consult for Heparin Indication: atrial fibrillation  Allergies  Allergen Reactions  . Aspirin     Rash - only in high doses  . Dimetapp Cold-Allergy     Rash   . Ilosone [Erythromycin Estolate]     rash  . Morphine And Related     rash  . Penicillins     Rash Has patient had a PCN reaction causing immediate rash, facial/tongue/throat swelling, SOB or lightheadedness with hypotension: YES Has patient had a PCN reaction causing severe rash involving mucus membranes or skin necrosis:NO Has patient had a PCN reaction that required hospitalization NO Has patient had a PCN reaction occurring within the last 10 years:NO If all of the above answers are "NO", then may proceed with Cephalosporin use.  Marland Kitchen Restoril     rash  . Zolpidem Tartrate     rash    Patient Measurements: Height: 5\' 4"  (162.6 cm) Weight: 133 lb 3.2 oz (60.4 kg) IBW/kg (Calculated) : 54.7 Heparin Dosing Weight: 60.4 kg  Vital Signs: Temp: 98.5 F (36.9 C) (11/25 0635) Temp Source: Oral (11/25 0635) BP: 137/62 (11/25 0835) Pulse Rate: 94 (11/25 0835)   Assessment: 82 yo F presents on 11/23 with low back pain. Has a history of Afib on Xarelto at home. Last dose of Xarelto taken on 11/23 and unsure if what time. Neurosurgery be consulted to see if any procedure will be down so pharmacy consulted to start heparin. HL remains elevated at 0.77, but aPTT therapeutic at 82. CBC stable, no s/s of bleed.   Goal of Therapy:  APTT 66-102 Monitor platelets by anticoagulation protocol: Yes   Plan:  Continue heparin gtt at 900 units/hr Monitor daily HL / aPTT, CBC, s/s of bleed  Elenor Quinones, PharmD, [facility] Clinical Pharmacist Pager [phone] 02/28/2016 10:49 AM

## 2016-02-28 NOTE — Clinical Social Work Note (Signed)
Clinical Social Work Assessment  Patient Details  Name: Karen Arroyo MRN: [id number] Date of Birth: 05/09/1933  Date of referral:  02/28/16               Reason for consult:  Facility Placement                Permission sought to share information with:  Family Supports Permission granted to share information::     Name::     Legrand Como  Agency::     Relationship::  Son  Contact Information:  [phone]  Housing/Transportation Living arrangements for the past 2 months:  Single Family Home Source of Information:  Adult Children Patient Interpreter Needed:  None Criminal Activity/Legal Involvement Pertinent to Current Situation/Hospitalization:  No - Comment as needed Significant Relationships:  Adult Children, Spouse Lives with:  Spouse Do you feel safe going back to the place where you live?  Yes Need for family participation in patient care:  Yes (Comment)  Care giving concerns:  CSW spoke with pt's son. Pt has early signs of Dementia. Per RN pt is less oriented today. RN suggest CSW speak with family. Pt's son reports he is headed to the hospital and would be speaking with the MD regarding his mothers condition. Pt's son is agreeable to SNF placement at this time.   Social Worker assessment / plan:  CSW spoke with pt's son via telephone. Pt lives at home with her husband, however pt's son reports his father is not able to help with pt's care. Pt's son reports pt's father was responsible for providing pt with medications at the right time and pt's son believes this is the cause of the syncopal episode--pt's son states his father was not doing so. Pt's son reports pt has early stages of dementia and can not remember what medication to take and when. Pt's son reports he has made renovations at their home to make it safe for the stage of life his parents are going through. Pt's son previously inquired about home health. Pt's son reports patient will need home health after rehab. Pt's son is  agreeable to SNF placement and verbalized permission for CSW to send referral to [facility] facilities. CSW will provide bed offers once available.  Employment status:  Retired Nurse, adult PT Recommendations:  [place] / Referral to community resources:  [place]  Patient/Family's Response to care:  Pt's son verbalized understanding of CSW role and appreciation of support.  Patient/Family's Understanding of and Emotional Response to Diagnosis, Current Treatment, and Prognosis:  Pt's son understanding of pt's physical and cognitive limitations. Pt's son is agreeable to SNF placement for pt at this time. Pt's son denies any further questions or concerns at this time. Pt's son recorded CSW number incase questions or concerns come up.  Emotional Assessment Appearance:  Appears stated age Attitude/Demeanor/Rapport:  Unable to Assess Affect (typically observed):  Unable to Assess Orientation:  Oriented to Self, Oriented to Place, Oriented to  Time, Fluctuating Orientation (Suspected and/or reported Sundowners) Alcohol / Substance use:  Not Applicable Psych involvement (Current and /or in the community):  No (Comment)  Discharge Needs  Concerns to be addressed:  No discharge needs identified Readmission within the last 30 days:  No Current discharge risk:  Dependent with Mobility Barriers to Discharge:  Continued Medical Work up   [employer], [place]

## 2016-02-28 NOTE — Clinical Social Work Note (Signed)
CSW acknowledges CSW consult for SNF placement. Please order PT eval.  Sela Hilding, [place]

## 2016-02-28 NOTE — Progress Notes (Signed)
Patient ID: Karen Arroyo, female   DOB: Nov 13, 1933, 82 y.o.   MRN: [id number] Subjective:  The patient is alert and pleasant. She complains of chest pain, bilateral arm pain, back pain, etc. Her chest pain is being worked up with EKG and cardiac enzymes.  Objective: Vital signs in last 24 hours: Temp:  [98.1 F (36.7 C)-98.5 F (36.9 C)] 98.5 F (36.9 C) (11/25 0635) Pulse Rate:  [77-129] 94 (11/25 0835) Resp:  [18] 18 (11/25 0635) BP: (137-176)/(62-100) 137/62 (11/25 0835) SpO2:  [91 %-99 %] 99 % (11/25 0835)  Intake/Output from previous day: 11/24 0701 - 11/25 0700 In: 991.7 [P.O.:120; I.V.:871.7] Out: 400 [Urine:400] Intake/Output this shift: No intake/output data recorded.  Physical exam the patient is alert and pleasant. She is moving all 4 extremities well.  I reviewed the patient's lumbar x-rays performed in her brace yesterday. It demonstrates her L1 fracture hasn't changed much.  Lab Results:  Recent Labs  02/27/16 1048 02/28/16 0343  WBC 8.5 6.7  HGB 13.2 12.9  HCT 38.9 38.5  PLT 209 216   BMET  Recent Labs  02/27/16 1048 02/28/16 0343  NA 138 137  K 2.9* 4.9  CL 101 106  CO2 29 23  GLUCOSE 162* 178*  BUN 8 10  CREATININE 0.86 0.81  CALCIUM 9.2 9.1    Studies/Results: Ct Lumbar Spine Wo Contrast  Result Date: 02/27/2016 CLINICAL DATA:  82 y/o  F; back pain after a fall. EXAM: CT LUMBAR SPINE WITHOUT CONTRAST TECHNIQUE: Multidetector CT imaging of the lumbar spine was performed without intravenous contrast administration. Multiplanar CT image reconstructions were also generated. COMPARISON:  CT abdomen and pelvis 07/09/2005 FINDINGS: Segmentation: 5 lumbar type vertebrae. Alignment: Normal. Vertebrae: Moderate L1 superior endplate compression deformity within acute appearance. There is retropulsion of the posterior margin and lateral margins of the superior endplate into the central, left subarticular, left foraminal, and left extra foraminal zones.  Mild resultant bony canal stenosis. No other fracture is identified. Paraspinal and other soft tissues: Severe calcific atherosclerosis of the abdominal aorta. Gallstones. There is mild paraspinal edema at the level of the L1 vertebral body. No hematoma identified. Punctate densities within the right renal hilum probably represent vascular calcifications, less likely nephrolithiasis. Disc levels: Lumbar spondylosis and facet arthropathy without high-grade bony canal stenosis or foraminal narrowing. IMPRESSION: Moderate L1 superior endplate compression deformity with acute appearance and retropulsion of the posterior aspect of the superior endplate into the spinal canal with mild resultant bony canal stenosis. MRI can better assess degree of neural impingement if clinically indicated. No other fracture identified. Electronically Signed   By: Kristine Garbe M.D.   On: 02/27/2016 00:31   Mr Lumbar Spine Wo Contrast  Result Date: 02/27/2016 CLINICAL DATA:  82 y/o  F; L1 compression deformity. EXAM: MRI LUMBAR SPINE WITHOUT CONTRAST TECHNIQUE: Multiplanar, multisequence MR imaging of the lumbar spine was performed. No intravenous contrast was administered. COMPARISON:  02/26/2016 lumbar CT. FINDINGS: Segmentation:  Standard. Alignment:  Physiologic. Vertebrae: Superior endplate deformity of L1 vertebral body with moderate loss of height and mild retropulsion of the posterior aspect of the superior endplate. There is edema within the vertebral body extending into the pedicles. No evidence for additional fracture, diskitis, or suspicious osseous lesion. Prominent S2 Tarlov cysts. Conus medullaris: Extends to the L1-2 level and appears normal. Paraspinal and other soft tissues: Left kidney lower pole T2 hyperintense structure measuring 18 mm probably representing a cyst. Disc levels: T12-L1: The retropulsed superior endplate of L1  effaces the anterior thecal sac. There is no cord impingement or significant  canal stenosis. Neural foramen are patent. L1-2: No significant disc displacement, foraminal narrowing, or canal stenosis. L2-3: Minimal disc bulge without significant foraminal narrowing or canal stenosis. L3-4: Minimal disc bulge without significant foraminal narrowing or canal stenosis. L4-5: Small disc bulge and mild facet/ligamentum flavum hypertrophy. Mild bilateral foraminal and lateral recess narrowing. No significant canal stenosis. Trace facet effusions. L5-S1: Minimal disc bulge mild bilateral facet hypertrophy. No significant foraminal narrowing or canal stenosis. IMPRESSION: L1 superior end plate acute moderate compression deformity. Mild retropulsion of the superior endplate effaces the anterior thecal sac of the spinal canal at that level without significant canal stenosis. There is no evidence for neural impingement. Electronically Signed   By: Kristine Garbe M.D.   On: 02/27/2016 05:08   Dg Lumbar Spine 1vclearing  Result Date: 02/27/2016 CLINICAL DATA:  L1 fracture. EXAM: LIMITED LUMBAR SPINE FOR TRAUMA CLEARING - 1 VIEW COMPARISON:  Radiographs and MRI of same day. FINDINGS: Moderate compression deformity of L1 vertebral body is noted consistent with fracture. Atherosclerosis of thoracic aorta is noted. Mild diffuse osteopenia is noted. No spondylolisthesis is noted. Remaining vertebral bodies appear intact. Disc spaces are well-maintained. IMPRESSION: Aortic atherosclerosis. Moderate compression deformity of L1 vertebral body consistent with fracture. Electronically Signed   By: Marijo Conception, M.D.   On: 02/27/2016 18:01   Dg Lumbar Spine 1vclearing  Result Date: 02/27/2016 CLINICAL DATA:  Known L1 compression deformity EXAM: LIMITED LUMBAR SPINE FOR TRAUMA CLEARING - 1 VIEW COMPARISON:  CT from 02/26/2016 and MRI from 02/27/2016 FINDINGS: L1 compression deformity is noted similar to that seen on prior CT and MRI. Slight increase kyphosis is noted at L1. Remainder of the  lumbar vertebra show normal vertebral height. Mild anterolisthesis of L4 on L5 is noted. Facet hypertrophic changes are seen. Diffuse aortic calcifications are noted. IMPRESSION: L1 compression deformity similar to that noted on prior CT and MR. Degenerative changes as described. Electronically Signed   By: Inez Catalina M.D.   On: 02/27/2016 11:33    Assessment/Plan: L1 fracture: The patient can be mobilized in her TLSO. She should die on her TLSO while in bed and wear it whenever she is out of bed or the head of bed is more than 30. I discussed this with the patient's son.  LOS: 1 day     Honey Zakarian D 02/28/2016, 9:23 AM

## 2016-02-28 NOTE — Progress Notes (Signed)
Family requested consultation with [employer] to learn how to apply brace for pt.  Called Ortho Tech and he called [employer] and they will come on Monday, 03/01/16 at 11:00 am to consult with family on brace.  Family informed and this is time they suggested.

## 2016-02-28 NOTE — Progress Notes (Signed)
Pt's BP this am is 176/101. Pt denies any chest pain. Paged on call. Awaiting for call back. Will continue to monitor pt.

## 2016-02-28 NOTE — Progress Notes (Signed)
Notified from central telemetry patient was in uncontrolled A.Fib up to the 160's HR. Now patient in low 100's Hr,  BP 141/76 , pt feeling faint, feels chest pain and having nausea. Notified MD Quincy Simmonds, he said order troponin and another EKG and he would come see her in 15 min.

## 2016-02-28 NOTE — Progress Notes (Signed)
Notified Dr. Tana Coast of patient's latest troponin 4.15. Pt asymptomatic. VSS. Dr. Tana Coast will order more troponins.

## 2016-02-28 NOTE — Progress Notes (Signed)
CRITICAL VALUE STICKER  CRITICAL VALUE: Troponin 0.03  RECEIVER (on-site recipient of call):Ivone Licht  DATE & TIME NOTIFIED: 02/28/16 0943  MESSENGER (representative from lab):  MD NOTIFIED: Quincy Simmonds  TIME OF NOTIFICATION:0945  RESPONSE:

## 2016-02-28 NOTE — Consult Note (Addendum)
Cardiology Consult    Patient ID: Karen Arroyo MRN: [id number], DOB/AGE: May 20, 1933   Admit date: 02/26/2016 Date of Consult: 02/28/2016  Primary Physician: Tivis Ringer, MD Primary Cardiologist: Dr. Debara Pickett Requesting Provider: Dr. Patrecia Pour Reason for Consultation: AF, elevated Trop  Patient Profile    82 yo female with PMH of permanent AF, CAD s/p CABG (2000), pulmonary HTN, and HLD who presented c/o lower back pain after having a fall on 02/20/16.   Past Medical History   Past Medical History:  Diagnosis Date  . Coronary artery disease   . Diabetes mellitus   . Diastolic dysfunction   . Dyslipidemia   . Edema   . HX: long term anticoagulant use   . 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 PAF (paroxysmal atrial fibrillation) ([place])   . Pulmonary hypertension   . S/P CABG (coronary artery bypass graft) 2000  . Skin cancer    nose, leg    Past Surgical History:  Procedure Laterality Date  . CARDIAC CATHETERIZATION  1995   dr Oran Rein  . CORONARY ARTERY BYPASS GRAFT  2000   LIMA to LAD; SVG to intermediate, SVG to PD and RCA per Dr. Servando Snare  . OTHER SURGICAL HISTORY  1999   historectomy  . TUBAL LIGATION  1961     Allergies  Allergies  Allergen Reactions  . Aspirin     Rash - only in high doses  . Dimetapp Cold-Allergy     Rash   . Ilosone [Erythromycin Estolate]     rash  . Morphine And Related     rash  . Penicillins     Rash Has patient had a PCN reaction causing immediate rash, facial/tongue/throat swelling, SOB or lightheadedness with hypotension: YES Has patient had a PCN reaction causing severe rash involving mucus membranes or skin necrosis:NO Has patient had a PCN reaction that required hospitalization NO Has patient had a PCN reaction occurring within the last 10 years:NO If all of the above answers are "NO", then may proceed with Cephalosporin use.  Marland Kitchen Restoril     rash  . Zolpidem Tartrate     rash    History of Present Illness      Karen Arroyo is a 82 yo female with PMH of permanent AF on Xarelto, CAD s/p CABG (2000), pulmonary HTN, and HLD. She is a former patient of Dr. Mare Ferrari, now seeing Dr. Debara Pickett. She has a remote history of CAD with CABG in 2000. Also has a hx of syncope which she was admitted for back in 1/17, but noted to be orthostatic. Appears she has a low risk myoview back in 2012.   States she has been doing well, until about a week ago. Reports she was getting up out of bed on 11/17 and had a syncopal episode. Presented to the ED. CT of the head was negative, and she was discharged home on pain medications for her lower back. Followed up with her PCP and was given tramadol. Her pain continued to get worse and she presented back to the ED. X-rays this admission showed lumbar burst fracture and neurosurgery was called for consultation.   Beta blockers were held on admission. Rivaroxaban was discontinued and heparin initiated. She has been on low-dose aspirin.  She's had episodes recurrently of "A. fib spells" presuming that these refer to more rapid rates. According to her sons they are  associated with chest pain  This morning around 6:30am she was woken from sleep  with severe chest pain and palpitations. Reports pain radiated down into her bilateral arms. Telemetry showed AF RVR with rates into the 160s. She was given 5mg  IV diltiazem, and metoprolol 12.5mg  PO. Troponins showed 0.03>>2.23 today. Her rate has since improved, but remains elevated into the 110s. Currently no longer having any chest pain. EKG this morning shows AF rate-100, with ST depression in v3-v6. Follow up EKG shows rate remaining in the 110s, with improvement in ST segments. \  Is relevant that she struggles with dementia. She is variably lucid. She has hallucinations.  Furthermore, one son says that when Dr. Mare Ferrari talked most recently about catheterization, they felt that it was not possible to do  Inpatient Medications    . aspirin EC   81 mg Oral QHS  . atorvastatin  10 mg Oral Daily  . cholecalciferol  2,000 Units Oral QHS  . citalopram  20 mg Oral QPM  . donepezil  10 mg Oral Daily  . feeding supplement (GLUCERNA SHAKE)  237 mL Oral BID BM  . insulin aspart  0-9 Units Subcutaneous TID WC  . loratadine  10 mg Oral Daily  . metoprolol tartrate  25 mg Oral BID  . mirabegron ER  25 mg Oral Daily  . potassium chloride  10 mEq Oral Daily    Family History    Family History  Problem Relation Age of Onset  . Heart attack Mother   . Transient ischemic attack Father     Social History    Social History   Social History  . Marital status: Married    Spouse name: N/A  . Number of children: 3  . Years of education: N/A   Occupational History  . Retired    Social History Main Topics  . Smoking status: Never Smoker  . Smokeless tobacco: Never Used  . Alcohol use 1.8 oz/week    3 Glasses of wine per week  . Drug use: No  . Sexual activity: Not on file   Other Topics Concern  . Not on file   Social History Narrative  . No narrative on file     Review of Systems    General:  No chills, fever, night sweats or weight changes.  Cardiovascular: See HPI Dermatological: No rash, lesions/masses Respiratory: No cough, dyspnea Urologic: No hematuria, dysuria Abdominal:   No nausea, vomiting, diarrhea, bright red blood per rectum, melena, or hematemesis Neurologic:  No visual changes, wkns, changes in mental status, +back pain All other systems reviewed and are otherwise negative except as noted above.  Physical Exam    Blood pressure 137/62, pulse 94, temperature 98.5 F (36.9 C), temperature source Oral, resp. rate 18, height 5\' 4"  (1.626 m), weight 133 lb 3.2 oz (60.4 kg), SpO2 99 %.  Alert and oriented X 2  Complaining of moderate back pain  HENT- normal Eyes- EOMI, without scleral icterus Skin- warm and dry; without rashes LN-neg Neck- supple without thyromegaly, JVP-8-10, carotids brisk and full  without bruits Back-without CVAT or kyphosis Lungs-clear to auscultation CV-Irregularly irregular rate and rhythm, nl S1 and S2, no murmurs gallops or rubs, S4-absent Abd-soft with active bowel sounds; no midline pulsation or hepatomegaly Pulses-intact femoral and distal MKS-without gross deformity Neuro- Ax O, CN3-12 intact, grossly normal motor and sensory function Affect engaging   Labs    Troponin (Point of Care Test) No results for input(s): TROPIPOC in the last 72 hours.  Recent Labs  02/28/16 0822 02/28/16 0947 02/28/16 1418  CKTOTAL  --  36*  --   CKMB  --  2.3  --   TROPONINI 0.03*  --  2.23*   Lab Results  Component Value Date   WBC 6.7 02/28/2016   HGB 12.9 02/28/2016   HCT 38.5 02/28/2016   MCV 86.1 02/28/2016   PLT 216 02/28/2016    Recent Labs Lab 02/27/16 1048 02/28/16 0343  NA 138 137  K 2.9* 4.9  CL 101 106  CO2 29 23  BUN 8 10  CREATININE 0.86 0.81  CALCIUM 9.2 9.1  PROT 5.9*  --   BILITOT 1.1  --   ALKPHOS 74  --   ALT 16  --   AST 20  --   GLUCOSE 162* 178*   Lab Results  Component Value Date   CHOL 208 (H) 10/03/2012   HDL 67.00 10/03/2012   TRIG 83.0 10/03/2012   No results found for: [facility]   Radiology Studies    Ct Lumbar Spine Wo Contrast  Result Date: 02/27/2016 CLINICAL DATA:  82 y/o  F; back pain after a fall. EXAM: CT LUMBAR SPINE WITHOUT CONTRAST TECHNIQUE: Multidetector CT imaging of the lumbar spine was performed without intravenous contrast administration. Multiplanar CT image reconstructions were also generated. COMPARISON:  CT abdomen and pelvis 07/09/2005 FINDINGS: Segmentation: 5 lumbar type vertebrae. Alignment: Normal. Vertebrae: Moderate L1 superior endplate compression deformity within acute appearance. There is retropulsion of the posterior margin and lateral margins of the superior endplate into the central, left subarticular, left foraminal, and left extra foraminal zones. Mild resultant bony canal stenosis.  No other fracture is identified. Paraspinal and other soft tissues: Severe calcific atherosclerosis of the abdominal aorta. Gallstones. There is mild paraspinal edema at the level of the L1 vertebral body. No hematoma identified. Punctate densities within the right renal hilum probably represent vascular calcifications, less likely nephrolithiasis. Disc levels: Lumbar spondylosis and facet arthropathy without high-grade bony canal stenosis or foraminal narrowing. IMPRESSION: Moderate L1 superior endplate compression deformity with acute appearance and retropulsion of the posterior aspect of the superior endplate into the spinal canal with mild resultant bony canal stenosis. MRI can better assess degree of neural impingement if clinically indicated. No other fracture identified. Electronically Signed   By: Kristine Garbe M.D.   On: 02/27/2016 00:31   Mr Lumbar Spine Wo Contrast  Result Date: 02/27/2016 CLINICAL DATA:  82 y/o  F; L1 compression deformity. EXAM: MRI LUMBAR SPINE WITHOUT CONTRAST TECHNIQUE: Multiplanar, multisequence MR imaging of the lumbar spine was performed. No intravenous contrast was administered. COMPARISON:  02/26/2016 lumbar CT. FINDINGS: Segmentation:  Standard. Alignment:  Physiologic. Vertebrae: Superior endplate deformity of L1 vertebral body with moderate loss of height and mild retropulsion of the posterior aspect of the superior endplate. There is edema within the vertebral body extending into the pedicles. No evidence for additional fracture, diskitis, or suspicious osseous lesion. Prominent S2 Tarlov cysts. Conus medullaris: Extends to the L1-2 level and appears normal. Paraspinal and other soft tissues: Left kidney lower pole T2 hyperintense structure measuring 18 mm probably representing a cyst. Disc levels: T12-L1: The retropulsed superior endplate of L1 effaces the anterior thecal sac. There is no cord impingement or significant canal stenosis. Neural foramen are  patent. L1-2: No significant disc displacement, foraminal narrowing, or canal stenosis. L2-3: Minimal disc bulge without significant foraminal narrowing or canal stenosis. L3-4: Minimal disc bulge without significant foraminal narrowing or canal stenosis. L4-5: Small disc bulge and mild facet/ligamentum flavum hypertrophy. Mild bilateral foraminal and lateral recess narrowing. No  significant canal stenosis. Trace facet effusions. L5-S1: Minimal disc bulge mild bilateral facet hypertrophy. No significant foraminal narrowing or canal stenosis. IMPRESSION: L1 superior end plate acute moderate compression deformity. Mild retropulsion of the superior endplate effaces the anterior thecal sac of the spinal canal at that level without significant canal stenosis. There is no evidence for neural impingement. Electronically Signed   By: Kristine Garbe M.D.   On: 02/27/2016 05:08   Dg Lumbar Spine 1vclearing  Result Date: 02/27/2016 CLINICAL DATA:  L1 fracture. EXAM: LIMITED LUMBAR SPINE FOR TRAUMA CLEARING - 1 VIEW COMPARISON:  Radiographs and MRI of same day. FINDINGS: Moderate compression deformity of L1 vertebral body is noted consistent with fracture. Atherosclerosis of thoracic aorta is noted. Mild diffuse osteopenia is noted. No spondylolisthesis is noted. Remaining vertebral bodies appear intact. Disc spaces are well-maintained. IMPRESSION: Aortic atherosclerosis. Moderate compression deformity of L1 vertebral body consistent with fracture. Electronically Signed   By: Marijo Conception, M.D.   On: 02/27/2016 18:01   Dg Lumbar Spine 1vclearing  Result Date: 02/27/2016 CLINICAL DATA:  Known L1 compression deformity EXAM: LIMITED LUMBAR SPINE FOR TRAUMA CLEARING - 1 VIEW COMPARISON:  CT from 02/26/2016 and MRI from 02/27/2016 FINDINGS: L1 compression deformity is noted similar to that seen on prior CT and MRI. Slight increase kyphosis is noted at L1. Remainder of the lumbar vertebra show normal vertebral  height. Mild anterolisthesis of L4 on L5 is noted. Facet hypertrophic changes are seen. Diffuse aortic calcifications are noted. IMPRESSION: L1 compression deformity similar to that noted on prior CT and MR. Degenerative changes as described. Electronically Signed   By: Inez Catalina M.D.   On: 02/27/2016 11:33    ECG & Cardiac Imaging    EKG: AF with ST depression in v3-v6.   Echo: 04/27/15  Study Conclusions  - Left ventricle: The cavity size was normal. Wall thickness was   increased increased in a pattern of mild to moderate LVH.   Systolic function was normal. The estimated ejection fraction was   in the range of 50% to 55%. Wall motion was normal; there were no   regional wall motion abnormalities. - Aortic valve: Valve area (Vmax): 2.05 cm^2. - Left atrium: The atrium was moderately dilated. - Right atrium: The atrium was mildly to moderately dilated. - Pulmonary arteries: Systolic pressure was mildly increased. PA   peak pressure: 38 mm Hg (S).  Assessment & Plan    82 yo female with PMH of permanent AF, CAD s/p CABG (2000), pulmonary HTN, and HLD who presented c/o lower back pain after having a fall on 02/20/16.   1. AF RVR: Noted to develop elevated HR early this morning with rates into the 160s. Improved with IV diltiazem 5mg  bolus. Metoprolol dose was increased from 12.5mg >>25mg  BID. Rates improved but remain somewhat elevated.  -- Was on Xarelto, but this had been held and started on IV heparin for anticoagulation.  -- Will further increase metoprolol to q8hrs for better rate control -- Also noted to have syncope when she presented to the ED on 11/17, may benefit from loop recorder given she issues with recurrent syncope.   2. Elevated Trop: Initial was neg, but follow up noted at 2.23. Did have chest pain this morning while rate was elevated, but no longer having pain.  -- Continue to trend enzymes, check echo. Further recommendations pending labs and echo results.   3.  Lumbar Burst Fracture: Neurosurgery following,has a TLSO brace placed.   Signed, Reino Bellis,  NP-C Pager [phone] 02/28/2016, 4:38 PM     The patient has an elevated troponin in the setting of an episode of atrial fibrillation with a rapid rate. This occurred in the context of her burst fracture, beta blockers being held.  It is not yet clear whether the positive troponin is a consequence i.e. demand related to rapid atrial fibrillation, or is the trigger i.e. with an ischemic event/Tako Tsubo event.    In any case, a first-order is slowing the heart rate down. We will increase her beta blockers gently.  I have had discussions with her husband and 2 brothers. There seems to me to be significant tension between the 2 brothers. A younger brother says that he has been taking care of  parents for the last 69 years and that his older brother has been uninvolved; that having been said the older brother interpolated himself and interrupted on a number of occasions conversations with his younger brother. I suggested to the family related to the big picture of caring for their family member with progressive dementia and concerns of loss of independence that the book [employer] may help frame the conversation  In the context of the above, we did reach the following plan. A non-interventional approach will be pursued. The patient's husband recalls that Dr. TB at suggested that repeat catheterization would not be appropriate. Hence, Myoview scanning is unnecessary. And we have elected to pursue empiric medical therapy.  We will attempt to try to elucidate the mechanism. We will follow her enzymes. We will undertake an echo to exclude Tako Tsubo. We will continue her on aspirin and heparin with the plan to resume her NOAC in relatively short order.  We will increase her beta blockers as her blood pressure allows to help control heart rate.  I have spoke with Dr. Arnoldo Morale from neurosurgery. As her  fracture was a week ago, he has no problems using heparin and aspirin as needed for her heart.  At the time of disposition planning, the family is anticipating rehabilitation and then possibly going home with 24/7 coverage

## 2016-02-28 NOTE — Progress Notes (Signed)
Lab called with a critical troponin level of 2.23.

## 2016-02-28 NOTE — Progress Notes (Signed)
Patient feeling like having a heart attack, pain in right arm, HR elevated in 130's. Calling Rapid Response, MD on his way.

## 2016-02-28 NOTE — Progress Notes (Signed)
Called by Julious Payer to see patient with HAR 160's. Arrived to bedside late due to another situation.  By the time I got to bedside, MD present, talking with family.  EKG was done, orders received by MD.  No RRT interventions at this time.  RN to call if assistance needed

## 2016-02-28 NOTE — Evaluation (Addendum)
Physical Therapy Evaluation Patient Details Name: Karen Arroyo MRN: [id number] DOB: 07-31-1933 Today's Date: 02/28/2016   History of Present Illness  Pt is an 82 y/o female admitted secondary to a fall from a syncopal episode at home with worsening back pain. PMH including but not limited to DM, A-fib, CAD s/p CABG in 2000 and "memory issues".  Clinical Impression  Pt presented supine in bed with HOB elevated and willing to participate in therapy session. Pt was very confused throughout and not a reliable historian. Some information was provided by pt but no caregiver or family member present to confirm the information. Pt required max A to don/doff her TLSO. She performed bed mobility with min guard, transfers with min A and ambulated with RW ~25' with min guard. Pt would continue to benefit from skilled physical therapy services at this time while admitted and after d/c to address her below listed limitations in order to improve her overall safety and independence with functional mobility.  Of note: Pt's troponin slightly elevated to 0.03 prior to session; however, pt's nurse and MD both aware and requesting that PT evaluate pt. Pt's HR and SPO2 were stable throughout.     Follow Up Recommendations SNF;Other (comment) (if pt refuses, will need 24 hr supervision, HH PT and RW)    Equipment Recommendations  Rolling walker with 5" wheels    Recommendations for Other Services       Precautions / Restrictions Precautions Precautions: Fall Required Braces or Orthoses: Spinal Brace Spinal Brace: Thoracolumbosacral orthotic Restrictions Weight Bearing Restrictions: No      Mobility  Bed Mobility Overal bed mobility: Needs Assistance Bed Mobility: Rolling;Sidelying to Sit;Sit to Sidelying Rolling: Min guard Sidelying to sit: Min guard     Sit to sidelying: Min guard General bed mobility comments: pt required increased time, use of bed rails, VC'ing for sequencing and min guard for  safety  Transfers Overall transfer level: Needs assistance Equipment used: Rolling walker (2 wheeled);None Transfers: Sit to/from Stand Sit to Stand: Min assist         General transfer comment: pt initially standing from bed with no AD with min A but reaching for therapist to hold onto. pt sat back down at EOB and then performed sit-to-stand with min A and use of RW; however, despite VC'ing for bilateral hand placement pt pulled up with both hands from RW to stand.  Ambulation/Gait Ambulation/Gait assistance: Min guard Ambulation Distance (Feet): 25 Feet Assistive device: Rolling walker (2 wheeled) Gait Pattern/deviations: Step-through pattern;Decreased stride length Gait velocity: decreased Gait velocity interpretation: <1.8 ft/sec, indicative of risk for recurrent falls General Gait Details: pt demonstrated safety with RW, min guard for safety, no LOB.  Stairs            Wheelchair Mobility    Modified Rankin (Stroke Patients Only)       Balance Overall balance assessment: Needs assistance;History of Falls Sitting-balance support: Feet supported;No upper extremity supported Sitting balance-Leahy Scale: Good Sitting balance - Comments: max A to don/doff TLSO   Standing balance support: During functional activity;Bilateral upper extremity supported Standing balance-Leahy Scale: Poor Standing balance comment: pt reliant on bilateral UEs on RW                             Pertinent Vitals/Pain Pain Assessment: No/denies pain    Home Living Family/patient expects to be discharged to:: Private residence Living Arrangements: Spouse/significant other Available Help at Discharge: Family;Available  PRN/intermittently           Home Equipment: None Additional Comments: Pt not a good historian and no caregiver present. Pt was very confused and some what agitated throughout stating "I just don't know what to tell you"    Prior Function Level of  Independence: Independent         Comments: Pt stated she was independent but no caregiver present to confirm this information     Hand Dominance        Extremity/Trunk Assessment   Upper Extremity Assessment: Overall WFL for tasks assessed           Lower Extremity Assessment: Generalized weakness      Cervical / Trunk Assessment: Kyphotic;Other exceptions  Communication   Communication: No difficulties  Cognition Arousal/Alertness: Lethargic Behavior During Therapy: WFL for tasks assessed/performed;Agitated Overall Cognitive Status: No family/caregiver present to determine baseline cognitive functioning Area of Impairment: Attention;Memory;Following commands;Safety/judgement;Problem solving   Current Attention Level: Selective Memory: Decreased short-term memory Following Commands: Follows one step commands inconsistently;Follows one step commands with increased time Safety/Judgement: Decreased awareness of safety;Decreased awareness of deficits   Problem Solving: Slow processing;Decreased initiation;Difficulty sequencing;Requires verbal cues;Requires tactile cues General Comments: per pt's nurse, she has mild dementia at baseline that has worsened acutely secondary to lack of sleep and delirium     General Comments      Exercises     Assessment/Plan    PT Assessment Patient needs continued PT services  PT Problem List Decreased strength;Decreased activity tolerance;Decreased balance;Decreased mobility;Decreased coordination;Decreased knowledge of use of DME;Decreased safety awareness;Decreased cognition          PT Treatment Interventions DME instruction;Gait training;Stair training;Functional mobility training;Therapeutic activities;Therapeutic exercise;Balance training;Neuromuscular re-education;Patient/family education    PT Goals (Current goals can be found in the Care Plan section)  Acute Rehab PT Goals Patient Stated Goal: return home PT Goal  Formulation: With patient Time For Goal Achievement: 03/06/16 Potential to Achieve Goals: Fair    Frequency Min 3X/week   Barriers to discharge Decreased caregiver support      Co-evaluation               End of Session Equipment Utilized During Treatment: Back brace;Gait belt Activity Tolerance: Patient tolerated treatment well Patient left: in bed;with call bell/phone within reach;with bed alarm set Nurse Communication: Mobility status    Functional Assessment Tool Used: clinical judgement Functional Limitation: Mobility: Walking and moving around Mobility: Walking and Moving Around Current Status [id number]): At least 1 percent but less than 20 percent impaired, limited or restricted Mobility: Walking and Moving Around Goal Status [phone]): 0 percent impaired, limited or restricted Mobility: Walking and Moving Around Discharge Status [phone]): At least 1 percent but less than 20 percent impaired, limited or restricted    Time: [id number] PT Time Calculation (min) (ACUTE ONLY): 22 min   Charges:   PT Evaluation $PT Eval Moderate Complexity: 1 Procedure     PT G Codes:   PT G-Codes **NOT FOR INPATIENT CLASS** Functional Assessment Tool Used: clinical judgement Functional Limitation: Mobility: Walking and moving around Mobility: Walking and Moving Around Current Status [id number]): At least 1 percent but less than 20 percent impaired, limited or restricted Mobility: Walking and Moving Around Goal Status [phone]): 0 percent impaired, limited or restricted Mobility: Walking and Moving Around Discharge Status [phone]): At least 1 percent but less than 20 percent impaired, limited or restricted    [facility] 02/28/2016, 1:27 PM Sherie Don, [place], DPT [phone]

## 2016-02-28 NOTE — Clinical Social Work Placement (Signed)
   CLINICAL SOCIAL WORK PLACEMENT  NOTE  Date:  02/28/2016  Patient Details  Name: CHLORENE ANDERS MRN: [id number] Date of Birth: [birth year]  Clinical Social Work is seeking post-discharge placement for this patient at the [place] level of care (*CSW will initial, date and re-position this form in  chart as items are completed):      Patient/family provided with [facility] Work Department's list of facilities offering this level of care within the geographic area requested by the patient (or if unable, by the patient's family).  Yes   Patient/family informed of their freedom to choose among providers that offer the needed level of care, that participate in Medicare, Medicaid or managed care program needed by the patient, have an available bed and are willing to accept the patient.      Patient/family informed of [facility]'s ownership interest in [facility] and [facility], as well as of the fact that they are under no obligation to receive care at these facilities.  PASRR submitted to EDS on       PASRR number received on       Existing PASRR number confirmed on       FL2 transmitted to all facilities in geographic area requested by pt/family on       FL2 transmitted to all facilities within larger geographic area on       Patient informed that his/her managed care company has contracts with or will negotiate with certain facilities, including the following:            Patient/family informed of bed offers received.  Patient chooses bed at       Physician recommends and patient chooses bed at      Patient to be transferred to   on  .  Patient to be transferred to facility by       Patient family notified on   of transfer.  Name of family member notified:        PHYSICIAN       Additional Comment:    _______________________________________________ Alla German, LCSW 02/28/2016, 4:13 PM

## 2016-02-28 NOTE — Progress Notes (Signed)
Physical Therapy Treatment Patient Details Name: Karen Arroyo MRN: [id number] DOB: Jul 31, 1933 Today's Date: 02/28/2016    History of Present Illness Pt is an 82 y/o female admitted secondary to a fall from a syncopal episode at home with worsening back pain. PMH including but not limited to DM, A-fib, CAD s/p CABG in 2000 and "memory issues".    PT Comments    Pt presented sitting EOB with pt's husband and son present. PT returned at the request of pt's nurse and family re: donning/doffing TLSO. PT demonstrated and instructed pt and her family in donning and doffing TLSO in sitting EOB. Pt then requesting to lie back down in bed and PT reviewed log roll technique with family and pt. Pt would continue to benefit from skilled physical therapy services at this time while admitted and after d/c to address her limitations in order to improve her overall safety and independence with functional mobility.   Follow Up Recommendations  SNF (pt's son in agreement with pt d/c'ing to SNF)     Equipment Recommendations  None recommended by PT (defer to next venue)    Recommendations for Other Services       Precautions / Restrictions Precautions Precautions: Fall Required Braces or Orthoses: Spinal Brace Spinal Brace: Thoracolumbosacral orthotic Restrictions Weight Bearing Restrictions: No    Mobility  Bed Mobility Overal bed mobility: Needs Assistance Bed Mobility: Rolling;Sit to Sidelying Rolling: Min guard Sidelying to sit: Min guard     Sit to sidelying: Min assist General bed mobility comments: pt required increased time, use of bed rails, VC'ing for sequencing and min A to lift bilateral LEs onto bed  Transfers  Ambulation/Gait   Stairs            Wheelchair Mobility    Modified Rankin (Stroke Patients Only)       Balance Overall balance assessment: Needs assistance Sitting-balance support: Feet supported;No upper extremity supported Sitting balance-Leahy  Scale: Good Sitting balance - Comments: max A to don/doff TLSO; PT demonstrating and instructing pt, pt's husband and pt's son in donning/doffing TLSO   Standing balance support: During functional activity;Bilateral upper extremity supported Standing balance-Leahy Scale: Poor Standing balance comment: pt reliant on bilateral UEs on RW                    Cognition Arousal/Alertness: Awake/alert Behavior During Therapy: WFL for tasks assessed/performed Overall Cognitive Status: History of cognitive impairments - at baseline Area of Impairment: Attention;Memory;Following commands;Safety/judgement;Problem solving   Current Attention Level: Selective Memory: Decreased short-term memory Following Commands: Follows one step commands inconsistently;Follows one step commands with increased time Safety/Judgement: Decreased awareness of safety;Decreased awareness of deficits   Problem Solving: Slow processing;Decreased initiation;Difficulty sequencing;Requires verbal cues;Requires tactile cues General Comments: per pt's nurse, she has mild dementia at baseline that has worsened acutely secondary to lack of sleep and delirium     Exercises      General Comments        Pertinent Vitals/Pain Pain Assessment: No/denies pain    Home Living Family/patient expects to be discharged to:: Private residence Living Arrangements: Spouse/significant other Available Help at Discharge: Family;Available PRN/intermittently         Home Equipment: None Additional Comments: Pt not a good historian and no caregiver present. Pt was very confused and some what agitated throughout stating "I just don't know what to tell you"    Prior Function Level of Independence: Independent      Comments: Pt stated she was independent  but no caregiver present to confirm this information   PT Goals (current goals can now be found in the care plan section) Acute Rehab PT Goals Patient Stated Goal: return  home PT Goal Formulation: With patient Time For Goal Achievement: 03/06/16 Potential to Achieve Goals: Fair Progress towards PT goals: Progressing toward goals    Frequency    Min 3X/week      PT Plan Current plan remains appropriate    Co-evaluation             End of Session Equipment Utilized During Treatment: Back brace Activity Tolerance: Patient tolerated treatment well Patient left: in bed;with call bell/phone within reach;with family/visitor present     Time: 1414-1431 PT Time Calculation (min) (ACUTE ONLY): 17 min  Charges:  $Therapeutic Activity: 8-22 mins                    G Codes:  Functional Assessment Tool Used: clinical judgement Functional Limitation: Mobility: Walking and moving around Mobility: Walking and Moving Around Current Status [id number]): At least 1 percent but less than 20 percent impaired, limited or restricted Mobility: Walking and Moving Around Goal Status [phone]): 0 percent impaired, limited or restricted Mobility: Walking and Moving Around Discharge Status [phone]): At least 1 percent but less than 20 percent impaired, limited or restricted   [facility] 02/28/2016, 4:06 PM Sherie Don, [place], DPT [phone]

## 2016-02-29 DIAGNOSIS — I482 Chronic atrial fibrillation: Secondary | ICD-10-CM | POA: Diagnosis not present

## 2016-02-29 DIAGNOSIS — I214 Non-ST elevation (NSTEMI) myocardial infarction: Secondary | ICD-10-CM | POA: Diagnosis not present

## 2016-02-29 DIAGNOSIS — S32010A Wedge compression fracture of first lumbar vertebra, initial encounter for closed fracture: Secondary | ICD-10-CM | POA: Diagnosis not present

## 2016-02-29 DIAGNOSIS — F0390 Unspecified dementia without behavioral disturbance: Secondary | ICD-10-CM | POA: Diagnosis present

## 2016-02-29 DIAGNOSIS — I10 Essential (primary) hypertension: Secondary | ICD-10-CM | POA: Diagnosis present

## 2016-02-29 DIAGNOSIS — M810 Age-related osteoporosis without current pathological fracture: Secondary | ICD-10-CM | POA: Diagnosis present

## 2016-02-29 DIAGNOSIS — Z7982 Long term (current) use of aspirin: Secondary | ICD-10-CM | POA: Diagnosis not present

## 2016-02-29 DIAGNOSIS — Z880 Allergy status to penicillin: Secondary | ICD-10-CM | POA: Diagnosis not present

## 2016-02-29 DIAGNOSIS — Y92002 Bathroom of unspecified non-institutional (private) residence single-family (private) house as the place of occurrence of the external cause: Secondary | ICD-10-CM | POA: Diagnosis not present

## 2016-02-29 DIAGNOSIS — Z885 Allergy status to narcotic agent status: Secondary | ICD-10-CM | POA: Diagnosis not present

## 2016-02-29 DIAGNOSIS — Z951 Presence of aortocoronary bypass graft: Secondary | ICD-10-CM | POA: Diagnosis not present

## 2016-02-29 DIAGNOSIS — I2510 Atherosclerotic heart disease of native coronary artery without angina pectoris: Secondary | ICD-10-CM | POA: Diagnosis present

## 2016-02-29 DIAGNOSIS — R402413 Glasgow coma scale score 13-15, at hospital admission: Secondary | ICD-10-CM | POA: Diagnosis present

## 2016-02-29 DIAGNOSIS — Z85828 Personal history of other malignant neoplasm of skin: Secondary | ICD-10-CM | POA: Diagnosis not present

## 2016-02-29 DIAGNOSIS — I480 Paroxysmal atrial fibrillation: Secondary | ICD-10-CM | POA: Diagnosis present

## 2016-02-29 DIAGNOSIS — Z888 Allergy status to other drugs, medicaments and biological substances status: Secondary | ICD-10-CM | POA: Diagnosis not present

## 2016-02-29 DIAGNOSIS — S32011A Stable burst fracture of first lumbar vertebra, initial encounter for closed fracture: Secondary | ICD-10-CM | POA: Diagnosis present

## 2016-02-29 DIAGNOSIS — Z886 Allergy status to analgesic agent status: Secondary | ICD-10-CM | POA: Diagnosis not present

## 2016-02-29 DIAGNOSIS — Z8249 Family history of ischemic heart disease and other diseases of the circulatory system: Secondary | ICD-10-CM | POA: Diagnosis not present

## 2016-02-29 DIAGNOSIS — I252 Old myocardial infarction: Secondary | ICD-10-CM | POA: Diagnosis not present

## 2016-02-29 DIAGNOSIS — I369 Nonrheumatic tricuspid valve disorder, unspecified: Secondary | ICD-10-CM | POA: Diagnosis not present

## 2016-02-29 DIAGNOSIS — E785 Hyperlipidemia, unspecified: Secondary | ICD-10-CM | POA: Diagnosis present

## 2016-02-29 DIAGNOSIS — M545 Low back pain: Secondary | ICD-10-CM | POA: Diagnosis present

## 2016-02-29 DIAGNOSIS — Z79899 Other long term (current) drug therapy: Secondary | ICD-10-CM | POA: Diagnosis not present

## 2016-02-29 DIAGNOSIS — Z7901 Long term (current) use of anticoagulants: Secondary | ICD-10-CM | POA: Diagnosis not present

## 2016-02-29 DIAGNOSIS — W1830XA Fall on same level, unspecified, initial encounter: Secondary | ICD-10-CM | POA: Diagnosis present

## 2016-02-29 DIAGNOSIS — I160 Hypertensive urgency: Secondary | ICD-10-CM | POA: Diagnosis present

## 2016-02-29 DIAGNOSIS — S32010D Wedge compression fracture of first lumbar vertebra, subsequent encounter for fracture with routine healing: Secondary | ICD-10-CM | POA: Diagnosis not present

## 2016-02-29 LAB — CBC
HCT: 37.2 % (ref 36.0–46.0)
Hemoglobin: 12.4 g/dL (ref 12.0–15.0)
MCH: 29 pg (ref 26.0–34.0)
MCHC: 33.3 g/dL (ref 30.0–36.0)
MCV: 86.9 fL (ref 78.0–100.0)
PLATELETS: 221 10*3/uL (ref 150–400)
RBC: 4.28 MIL/uL (ref 3.87–5.11)
RDW: 15 % (ref 11.5–15.5)
WBC: 7.8 10*3/uL (ref 4.0–10.5)

## 2016-02-29 LAB — GLUCOSE, CAPILLARY
GLUCOSE-CAPILLARY: 179 mg/dL — AB (ref 65–99)
GLUCOSE-CAPILLARY: 207 mg/dL — AB (ref 65–99)
Glucose-Capillary: 172 mg/dL — ABNORMAL HIGH (ref 65–99)
Glucose-Capillary: 188 mg/dL — ABNORMAL HIGH (ref 65–99)

## 2016-02-29 LAB — HEPARIN LEVEL (UNFRACTIONATED): HEPARIN UNFRACTIONATED: 0.55 [IU]/mL (ref 0.30–0.70)

## 2016-02-29 LAB — APTT: APTT: 84 s — AB (ref 24–36)

## 2016-02-29 LAB — TROPONIN I
TROPONIN I: 4.07 ng/mL — AB (ref <0.03)
TROPONIN I: 2.44 ng/mL — AB (ref <0.03)

## 2016-02-29 MED ORDER — ATORVASTATIN CALCIUM 80 MG PO TABS
80.0000 mg | ORAL_TABLET | Freq: Every day | ORAL | Status: DC
  Administered 2016-03-01: 80 mg via ORAL
  Filled 2016-02-29: qty 1

## 2016-02-29 MED ORDER — METOPROLOL TARTRATE 50 MG PO TABS
50.0000 mg | ORAL_TABLET | Freq: Two times a day (BID) | ORAL | Status: DC
  Administered 2016-02-29 – 2016-03-01 (×2): 50 mg via ORAL
  Filled 2016-02-29 (×2): qty 1

## 2016-02-29 MED ORDER — OXYCODONE-ACETAMINOPHEN 5-325 MG PO TABS
2.0000 | ORAL_TABLET | Freq: Once | ORAL | Status: AC
  Administered 2016-02-29: 2 via ORAL
  Filled 2016-02-29: qty 2

## 2016-02-29 MED ORDER — DIPHENHYDRAMINE HCL 25 MG PO CAPS
25.0000 mg | ORAL_CAPSULE | ORAL | Status: DC | PRN
  Administered 2016-02-29: 25 mg via ORAL
  Filled 2016-02-29: qty 1

## 2016-02-29 NOTE — Progress Notes (Signed)
Patient ID: Karen Arroyo, female   DOB: Aug 27, 1933, 82 y.o.   MRN: [id number] Subjective:  The patient is alert and pleasant. She appears much more comfortable today. There is another son in the room with her.  Objective: Vital signs in last 24 hours: Temp:  [98.2 F (36.8 C)-99.4 F (37.4 C)] 98.2 F (36.8 C) (11/26 0505) Pulse Rate:  [88-101] 90 (11/26 0859) Resp:  [18] 18 (11/26 0505) BP: (135-151)/(54-94) 151/94 (11/26 0505) SpO2:  [94 %-96 %] 96 % (11/26 0505)  Intake/Output from previous day: 11/25 0701 - 11/26 0700 In: 591.6 [P.O.:240; I.V.:351.6] Out: 150 [Urine:150] Intake/Output this shift: No intake/output data recorded.  Physical exam the patient is alert and oriented. She is moving her lower extremities well.  Lab Results:  Recent Labs  02/28/16 0343 02/29/16 0136  WBC 6.7 7.8  HGB 12.9 12.4  HCT 38.5 37.2  PLT 216 221   BMET  Recent Labs  02/27/16 1048 02/28/16 0343  NA 138 137  K 2.9* 4.9  CL 101 106  CO2 29 23  GLUCOSE 162* 178*  BUN 8 10  CREATININE 0.86 0.81  CALCIUM 9.2 9.1    Studies/Results: Dg Lumbar Spine 1vclearing  Result Date: 02/27/2016 CLINICAL DATA:  L1 fracture. EXAM: LIMITED LUMBAR SPINE FOR TRAUMA CLEARING - 1 VIEW COMPARISON:  Radiographs and MRI of same day. FINDINGS: Moderate compression deformity of L1 vertebral body is noted consistent with fracture. Atherosclerosis of thoracic aorta is noted. Mild diffuse osteopenia is noted. No spondylolisthesis is noted. Remaining vertebral bodies appear intact. Disc spaces are well-maintained. IMPRESSION: Aortic atherosclerosis. Moderate compression deformity of L1 vertebral body consistent with fracture. Electronically Signed   By: Marijo Conception, M.D.   On: 02/27/2016 18:01   Dg Lumbar Spine 1vclearing  Result Date: 02/27/2016 CLINICAL DATA:  Known L1 compression deformity EXAM: LIMITED LUMBAR SPINE FOR TRAUMA CLEARING - 1 VIEW COMPARISON:  CT from 02/26/2016 and MRI from 02/27/2016  FINDINGS: L1 compression deformity is noted similar to that seen on prior CT and MRI. Slight increase kyphosis is noted at L1. Remainder of the lumbar vertebra show normal vertebral height. Mild anterolisthesis of L4 on L5 is noted. Facet hypertrophic changes are seen. Diffuse aortic calcifications are noted. IMPRESSION: L1 compression deformity similar to that noted on prior CT and MR. Degenerative changes as described. Electronically Signed   By: Inez Catalina M.D.   On: 02/27/2016 11:33    Assessment/Plan: L1 compression fracture, lumbago: I have again discussed the situation with the patient and now with another son. She is going to continue to wear her TLSO when out of bed. She is not a candidate for surgery presently. She will likely slowly improved.  A. fib, elevated cardiac enzymes: The patient has been started on anticoagulants.  LOS: 1 day     Ritaj Dullea D 02/29/2016, 9:14 AM

## 2016-02-29 NOTE — Progress Notes (Addendum)
PROGRESS NOTE [facility]   Karen Arroyo   [id number] DOB: 05/05/1933  DOA: 02/26/2016 PCP: Tivis Ringer, MD   Brief Narrative:  82 y/o F with PMHx of CAD, s/p CABG, Chronic Afib, DM type II, Dementia presented to ED with persistent back pain since a fall last week. Patient had  A syncopal episode causing the fall, in the ER had a CT which was unremarkable and was discharge home on Robaxin for pain. Pain got worse came to the ED and lumbar xray revealed a L1 compression fracture, Neurosurgery was consulted whom recommended conservative treatment with TLSO. Repeat xray shows better alignment when patient wearing the brace. On day 2 of hospital stay patient developed chest pain, SOB and went into Afib with RVR with HR up to 160's, Cardizem was given and cardiac enzymes were obtained.   Subjective: Patient doing much better today denies chest pain and shortness of breath. TNI peaked at 5.14 now trending down. Son is concerned about itchiness. Pain is well controlled with current pain medications.  Assessment & Plan: CAD s/p CABG -  NSTEMI - no chest pain today TNI peaked at 5.14 now trending down Cardiology recommendations appreciated On ASA and heparin drip - plan to switch to oral [facility] tomorrow, will keep on Xarelto and ASA O2 PRN  Metoprolol increased to 50mg  BID  Increase statin  ECHO pending  D/c IVF   Afib with RVR - remains in afib, rated better controled now  Cardizem x 1 given - rate was controlled - patient remained in afib Metoprolol dose was increased to 25 mg BID  Cardio consult  Continue tele monitoring  L1 Compression Fracture - xray with brace better alignment - no changes  Conservative treatment with TLSO brace  Neurosurgery recommendations appreciated  Pain control as needed   Hypertension - stable  BB increased Monitor BP  Hydralazine PRN   DM type 2 - CBG's stable  Continue SSI  Monitor CBG   Dementia - much better today - back to baseline    Pain control  Increase room lighting  Continue Aricept  DVT prophylaxis:  Heparin Drip  Code Status: Full  Family Communication: Son at bedside plan of care discussed in details  Disposition Plan: SNF when medically stable prob 24-48 hrs   Consultants:   Neurosurgery   Procedures:   None   Antimicrobials:  None    Objective: Vitals:   02/28/16 2041 02/29/16 0131 02/29/16 0505 02/29/16 0859  BP: (!) 135/54 140/82 (!) 151/94   Pulse: 98 97 88 90  Resp: 18  18   Temp: 99.4 F (37.4 C)  98.2 F (36.8 C)   TempSrc: Oral  Oral   SpO2: 94%  96%   Weight:      Height:        Intake/Output Summary (Last 24 hours) at 02/29/16 1351 Last data filed at 02/29/16 0131  Gross per 24 hour  Intake            591.6 ml  Output                0 ml  Net            591.6 ml   Filed Weights   02/26/16 1927 02/27/16 0545  Weight: 59 kg (130 lb) 60.4 kg (133 lb 3.2 oz)    Examination:  General exam: Lying in bed comfortable  Respiratory system: Clear to auscultation.  Cardiovascular system: S1 & S2 heard, IRR IRR, + systolic  murmurs Gastrointestinal system: Soft NT ND +BS Central nervous system: Alert and oriented. No focal neurological deficits. Extremities: No pedal edema.  Psychiatry: Pleasantly with dementia    Data Reviewed: I have personally reviewed following labs and imaging studies  CBC:  Recent Labs Lab 02/26/16 2324 02/27/16 1048 02/28/16 0343 02/29/16 0136  WBC 6.2 8.5 6.7 7.8  NEUTROABS 4.4 6.9  --   --   HGB 13.4 13.2 12.9 12.4  HCT 39.1 38.9 38.5 37.2  MCV 85.4 85.7 86.1 86.9  PLT 206 209 216 [id number]   Basic Metabolic Panel:  Recent Labs Lab 02/26/16 2324 02/27/16 1048 02/28/16 0343  NA 136 138 137  K 2.9* 2.9* 4.9  CL 100* 101 106  CO2 24 29 23   GLUCOSE 296* 162* 178*  BUN 14 8 10   CREATININE 1.11* 0.86 0.81  CALCIUM 9.2 9.2 9.1  MG  --   --  1.8   GFR: Estimated Creatinine Clearance: 47.8 mL/min (by C-G formula based on SCr of 0.81  mg/dL). Liver Function Tests:  Recent Labs Lab 02/26/16 2324 02/27/16 1048  AST 19 20  ALT 14 16  ALKPHOS 83 74  BILITOT 0.7 1.1  PROT 6.1* 5.9*  ALBUMIN 3.7 3.6   No results for input(s): LIPASE, AMYLASE in the last 168 hours. No results for input(s): AMMONIA in the last 168 hours. Coagulation Profile: No results for input(s): INR, PROTIME in the last 168 hours. Cardiac Enzymes:  Recent Labs Lab 02/28/16 0947 02/28/16 1418 02/28/16 1848 02/28/16 2136 02/29/16 0136 02/29/16 0819  CKTOTAL 36*  --   --   --   --   --   CKMB 2.3  --   --   --   --   --   TROPONINI  --  2.23* 4.15* 5.14* 4.07* 2.44*   BNP (last 3 results) No results for input(s): PROBNP in the last 8760 hours. HbA1C:  Recent Labs  02/27/16 1048  HGBA1C 9.3*   CBG:  Recent Labs Lab 02/28/16 1221 02/28/16 1748 02/28/16 2314 02/29/16 0746 02/29/16 1200  GLUCAP 200* 327* 157* 179* 172*   Lipid Profile: No results for input(s): CHOL, HDL, LDLCALC, TRIG, CHOLHDL, LDLDIRECT in the last 72 hours. Thyroid Function Tests: No results for input(s): TSH, T4TOTAL, FREET4, T3FREE, THYROIDAB in the last 72 hours. Anemia Panel: No results for input(s): VITAMINB12, FOLATE, FERRITIN, TIBC, IRON, RETICCTPCT in the last 72 hours. Sepsis Labs: No results for input(s): PROCALCITON, LATICACIDVEN in the last 168 hours.  No results found for this or any previous visit (from the past 240 hour(s)).     Radiology Studies: Dg Lumbar Spine 1vclearing  Result Date: 02/27/2016 CLINICAL DATA:  L1 fracture. EXAM: LIMITED LUMBAR SPINE FOR TRAUMA CLEARING - 1 VIEW COMPARISON:  Radiographs and MRI of same day. FINDINGS: Moderate compression deformity of L1 vertebral body is noted consistent with fracture. Atherosclerosis of thoracic aorta is noted. Mild diffuse osteopenia is noted. No spondylolisthesis is noted. Remaining vertebral bodies appear intact. Disc spaces are well-maintained. IMPRESSION: Aortic atherosclerosis.  Moderate compression deformity of L1 vertebral body consistent with fracture. Electronically Signed   By: Marijo Conception, M.D.   On: 02/27/2016 18:01      Scheduled Meds: . aspirin EC  81 mg Oral QHS  . [START ON 03/01/2016] atorvastatin  80 mg Oral Daily  . cholecalciferol  2,000 Units Oral QHS  . citalopram  20 mg Oral QPM  . donepezil  10 mg Oral Daily  . feeding supplement (GLUCERNA  SHAKE)  237 mL Oral BID BM  . insulin aspart  0-9 Units Subcutaneous TID WC  . loratadine  10 mg Oral Daily  . metoprolol tartrate  50 mg Oral BID  . mirabegron ER  25 mg Oral Daily  . potassium chloride  10 mEq Oral Daily   Continuous Infusions: . heparin 900 Units/hr (02/28/16 1442)     LOS: 1 day    Chipper Oman, MD [facility] Pager [phone]  If 7PM-7AM, please contact night-coverage [email or web address] Password [email or web address] 02/29/2016, 1:51 PM

## 2016-02-29 NOTE — Clinical Social Work Note (Signed)
MSW contacted patient's son, Legrand Como to present bed offers for SNF placement.   MSW encouraged pt's son, Legrand Como to visit facilities.   Patient will likely discharge on Mon, 11/27 vs Tues, 11/28.  MSW remains available as needed.   Glendon Axe, MSW [phone] 02/29/2016 1:20 PM

## 2016-02-29 NOTE — Progress Notes (Signed)
ANTICOAGULATION CONSULT NOTE - Initial Consult  Pharmacy Consult for Heparin Indication: atrial fibrillation  Allergies  Allergen Reactions  . Aspirin     Rash - only in high doses  . Dimetapp Cold-Allergy     Rash   . Ilosone [Erythromycin Estolate]     rash  . Morphine And Related     rash  . Penicillins     Rash Has patient had a PCN reaction causing immediate rash, facial/tongue/throat swelling, SOB or lightheadedness with hypotension: YES Has patient had a PCN reaction causing severe rash involving mucus membranes or skin necrosis:NO Has patient had a PCN reaction that required hospitalization NO Has patient had a PCN reaction occurring within the last 10 years:NO If all of the above answers are "NO", then may proceed with Cephalosporin use.  Marland Kitchen Restoril     rash  . Zolpidem Tartrate     rash    Patient Measurements: Height: 5\' 4"  (162.6 cm) Weight: 133 lb 3.2 oz (60.4 kg) IBW/kg (Calculated) : 54.7 Heparin Dosing Weight: 60.4 kg  Vital Signs: Temp: 98.2 F (36.8 C) (11/26 0505) Temp Source: Oral (11/26 0505) BP: 151/94 (11/26 0505) Pulse Rate: 88 (11/26 0505)   Assessment: 82 yo F presents on 11/23 with low back pain. Has a history of Afib on Xarelto at home. Last dose of Xarelto taken on 11/23 and unsure if what time. Neurosurgery consulted to see if any procedure will be done so pharmacy consulted to start heparin. HL not drawn this am, but aPTT therapeutic at 84. Neurosurgery does not plan on any surgery but Cards working up further before switching back to Xarelto. CBC stable, no s/s of bleed.   Goal of Therapy:  APTT 66-102 Monitor platelets by anticoagulation protocol: Yes   Plan:  Continue heparin gtt at 900 units/hr Monitor daily HL / aPTT, CBC, s/s of bleed F/U restart of Xarelto per Cards  Elenor Quinones, PharmD, BCPS Clinical Pharmacist Pager [phone] 02/29/2016 7:10 AM

## 2016-02-29 NOTE — Progress Notes (Addendum)
.       Patient Name: Karen Arroyo      SUBJECTIVE: 82 year old woman admitted following a syncopal episode week ago when she hurt her back. She was readmitted because of progressive back pain. Neurosurgery has seen her. She is found to have a burst fracture.  She has persistent/permanent atrial fibrillation with intermittent "spells" presuming related to rapid rates.  Rivaroxaban had been changed to heparin   11/20 5 AM awakened with atrial fibrillation and a rapid rate complaining of chest pain. Troponins were abnormal 0.03>> 2.23>>5.14 now decreasing   Medical therapy has been recommended  No interval chest pain     Past Medical History:  Diagnosis Date  . Coronary artery disease   . Diabetes mellitus   . Diastolic dysfunction   . Dyslipidemia   . Edema   . HX: long term anticoagulant use   . MI, old 48   involving small LCX  . PAF (paroxysmal atrial fibrillation) ([place])   . Pulmonary hypertension   . S/P CABG (coronary artery bypass graft) 2000  . Skin cancer    nose, leg    Scheduled Meds:  Scheduled Meds: . aspirin EC  81 mg Oral QHS  . atorvastatin  10 mg Oral Daily  . cholecalciferol  2,000 Units Oral QHS  . citalopram  20 mg Oral QPM  . donepezil  10 mg Oral Daily  . feeding supplement (GLUCERNA SHAKE)  237 mL Oral BID BM  . insulin aspart  0-9 Units Subcutaneous TID WC  . loratadine  10 mg Oral Daily  . metoprolol tartrate  25 mg Oral Q8H  . mirabegron ER  25 mg Oral Daily  . potassium chloride  10 mEq Oral Daily   Continuous Infusions: . heparin 900 Units/hr (02/28/16 1442)   acetaminophen **OR** acetaminophen, fentaNYL (SUBLIMAZE) injection, hydrALAZINE, loperamide, methocarbamol, ondansetron (ZOFRAN) IV, ondansetron **OR** ondansetron (ZOFRAN) IV, oxyCODONE    PHYSICAL EXAM Vitals:   02/28/16 2041 02/29/16 0131 02/29/16 0505 02/29/16 0859  BP: (!) 135/54 140/82 (!) 151/94   Pulse: 98 97 88 90  Resp: 18  18   Temp: 99.4 F (37.4 C)   98.2 F (36.8 C)   TempSrc: Oral  Oral   SpO2: 94%  96%   Weight:      Height:       Well developed and nourished in no acute distress   HENT normal Neck supple with JVP-7-8 t Clear Irregularly irregular rate and rhythm, no murmurs or gallops Abd-soft with active BS No Clubbing cyanosis edema Skin-warm and dry A & Oriented X1  Grossly normal sensory and motor function   TELEMETRY: Reviewed personnally pt in * afib with rare VT NS :   *   Intake/Output Summary (Last 24 hours) at 02/29/16 1159 Last data filed at 02/29/16 0131  Gross per 24 hour  Intake            591.6 ml  Output                0 ml  Net            591.6 ml    LABS: Basic Metabolic Panel:  Recent Labs Lab 02/26/16 2324 02/27/16 1048 02/28/16 0343  NA 136 138 137  K 2.9* 2.9* 4.9  CL 100* 101 106  CO2 24 29 23   GLUCOSE 296* 162* 178*  BUN 14 8 10   CREATININE 1.11* 0.86 0.81  CALCIUM 9.2 9.2 9.1  MG  --   --  1.8   Cardiac Enzymes:  Recent Labs  02/28/16 0947  02/28/16 2136 02/29/16 0136 02/29/16 0819  CKTOTAL 36*  --   --   --   --   CKMB 2.3  --   --   --   --   TROPONINI  --   < > 5.14* 4.07* 2.44*  < > = values in this interval not displayed. CBC:  Recent Labs Lab 02/26/16 2324 02/27/16 1048 02/28/16 0343 02/29/16 0136  WBC 6.2 8.5 6.7 7.8  NEUTROABS 4.4 6.9  --   --   HGB 13.4 13.2 12.9 12.4  HCT 39.1 38.9 38.5 37.2  MCV 85.4 85.7 86.1 86.9  PLT 206 209 216 221   PROTIME: No results for input(s): LABPROT, INR in the last 72 hours. Liver Function Tests:  Recent Labs  02/26/16 2324 02/27/16 1048  AST 19 20  ALT 14 16  ALKPHOS 83 74  BILITOT 0.7 1.1  PROT 6.1* 5.9*  ALBUMIN 3.7 3.6   No results for input(s): LIPASE, AMYLASE in the last 72 hours. BNP: BNP (last 3 results) No results for input(s): BNP in the last 8760 hours.  ProBNP (last 3 results) No results for input(s): PROBNP in the last 8760 hours.  D-Dimer: No results for input(s): DDIMER in the  last 72 hours. Hemoglobin A1C:  Recent Labs  02/27/16 1048  HGBA1C 9.3*      ASSESSMENT AND PLAN:  Principal Problem:   Compression fracture of first lumbar vertebra (HCC) Active Problems:   Atrial fibrillation (HCC)   Essential hypertension   Hx of CABG   Lumbar compression fracture (HCC)   NSTEMI (non-ST elevated myocardial infarction) ([place])  Will continue hep and ASA for now Anticipate resumption of   anticoagulation in am Plavix would be reasonable but triple therapy in this old lady is fraught with risk, so would probably hold;  She has had modest bleeding problems on current regime, so will use ASA and Rivaroxaban and not add plavix or substitute plavix for ASA  Reviewed the chpoce of therapy with son and husband    Change BB to bid as AFib rate better today and regime simpler  Increase atorvastatin for early hazard benefit    Will need case maanger consult (plz read my note from 11/25)   Signed, Virl Axe MD  02/29/2016

## 2016-03-01 ENCOUNTER — Inpatient Hospital Stay (HOSPITAL_COMMUNITY): Payer: Medicare Other

## 2016-03-01 DIAGNOSIS — S32010A Wedge compression fracture of first lumbar vertebra, initial encounter for closed fracture: Secondary | ICD-10-CM

## 2016-03-01 DIAGNOSIS — I369 Nonrheumatic tricuspid valve disorder, unspecified: Secondary | ICD-10-CM

## 2016-03-01 DIAGNOSIS — Z951 Presence of aortocoronary bypass graft: Secondary | ICD-10-CM

## 2016-03-01 LAB — GLUCOSE, CAPILLARY
GLUCOSE-CAPILLARY: 189 mg/dL — AB (ref 65–99)
GLUCOSE-CAPILLARY: 207 mg/dL — AB (ref 65–99)
GLUCOSE-CAPILLARY: 161 mg/dL — AB (ref 65–99)

## 2016-03-01 LAB — BASIC METABOLIC PANEL
Anion gap: 9 (ref 5–15)
BUN: 19 mg/dL (ref 6–20)
CALCIUM: 9.2 mg/dL (ref 8.9–10.3)
CO2: 21 mmol/L — ABNORMAL LOW (ref 22–32)
CREATININE: 0.99 mg/dL (ref 0.44–1.00)
Chloride: 105 mmol/L (ref 101–111)
GFR calc Af Amer: >60 mL/min (ref >60)
GFR, EST NON AFRICAN AMERICAN: 52 mL/min — AB (ref >60)
Glucose, Bld: 191 mg/dL — ABNORMAL HIGH (ref 65–99)
POTASSIUM: 4.5 mmol/L (ref 3.5–5.1)
SODIUM: 135 mmol/L (ref 135–145)

## 2016-03-01 LAB — CBC
HCT: 36.6 % (ref 36.0–46.0)
Hemoglobin: 12.2 g/dL (ref 12.0–15.0)
MCH: 28.8 pg (ref 26.0–34.0)
MCHC: 33.3 g/dL (ref 30.0–36.0)
MCV: 86.5 fL (ref 78.0–100.0)
PLATELETS: 203 10*3/uL (ref 150–400)
RBC: 4.23 MIL/uL (ref 3.87–5.11)
RDW: 15.1 % (ref 11.5–15.5)
WBC: 6.8 10*3/uL (ref 4.0–10.5)

## 2016-03-01 LAB — APTT: aPTT: 127 seconds — ABNORMAL HIGH (ref 24–36)

## 2016-03-01 LAB — TROPONIN I: TROPONIN I: 1.13 ng/mL — AB (ref <0.03)

## 2016-03-01 LAB — HEPARIN LEVEL (UNFRACTIONATED): Heparin Unfractionated: 0.42 IU/mL (ref 0.30–0.70)

## 2016-03-01 MED ORDER — OXYCODONE HCL 5 MG PO TABS: 5.0000 mg | ORAL_TABLET | ORAL | 0 refills | Status: DC | PRN

## 2016-03-01 MED ORDER — DOCUSATE SODIUM 100 MG PO CAPS: 200.0000 mg | ORAL_CAPSULE | Freq: Two times a day (BID) | ORAL | 0 refills | Status: DC

## 2016-03-01 MED ORDER — TRAMADOL HCL 50 MG PO TABS: 50.0000 mg | ORAL_TABLET | Freq: Four times a day (QID) | ORAL | 0 refills | Status: DC | PRN

## 2016-03-01 MED ORDER — GLUCERNA SHAKE PO LIQD: 237.0000 mL | Freq: Two times a day (BID) | ORAL | 0 refills | Status: DC

## 2016-03-01 MED ORDER — METOPROLOL TARTRATE 50 MG PO TABS: 50.0000 mg | ORAL_TABLET | Freq: Two times a day (BID) | ORAL | Status: AC

## 2016-03-01 MED ORDER — OXYCODONE HCL 5 MG PO TABS
5.0000 mg | ORAL_TABLET | Freq: Once | ORAL | Status: AC
  Administered 2016-03-01: 5 mg via ORAL
  Filled 2016-03-01: qty 1

## 2016-03-01 MED ORDER — HEPARIN (PORCINE) IN NACL 100-0.45 UNIT/ML-% IJ SOLN
850.0000 [IU]/h | INTRAMUSCULAR | Status: DC
  Filled 2016-03-01: qty 250

## 2016-03-01 MED ORDER — INSULIN GLARGINE 100 UNIT/ML ~~LOC~~ SOLN: 8.0000 [IU] | Freq: Every day | SUBCUTANEOUS | 0 refills | Status: DC

## 2016-03-01 MED ORDER — ATORVASTATIN CALCIUM 80 MG PO TABS: 80.0000 mg | ORAL_TABLET | Freq: Every day | ORAL | Status: AC

## 2016-03-01 MED ORDER — RIVAROXABAN 15 MG PO TABS
15.0000 mg | ORAL_TABLET | Freq: Every day | ORAL | Status: DC
  Administered 2016-03-01: 15 mg via ORAL
  Filled 2016-03-01: qty 1

## 2016-03-01 MED ORDER — INSULIN ASPART 100 UNIT/ML ~~LOC~~ SOLN
2.0000 [IU] | Freq: Three times a day (TID) | SUBCUTANEOUS | 0 refills | Status: DC
Start: 2016-03-01 — End: 2016-03-04

## 2016-03-01 NOTE — Progress Notes (Signed)
  Echocardiogram 2D Echocardiogram has been performed.  Karen Arroyo 03/01/2016, 3:50 PM

## 2016-03-01 NOTE — Discharge Instructions (Signed)
Lumbar Fracture Introduction A lumbar fracture is a break in one of the bones of the lower back. Lumbar fractures range in severity. Severe fractures can damage the spinal cord. What are the causes? This condition may be caused by:  A fall (common).  A car accident (common).  A gunshot wound.  A hard, direct hit to the back.  Osteoporosis. What are the signs or symptoms? The main symptom of this condition is severe pain in the lower back. If a fracture is complex or severe, there may also be:  A misshapen or swollen area on the lower back.  A limited ability to move an area of the lower back.  An inability to empty the bladder or bowel.  A loss of strength or sensation in the legs, feet, and toes.  Paralysis. How is this diagnosed? This condition is diagnosed based on:  A physical exam.  Symptoms and what happened just before they developed.  The results of imaging tests, such as an X-ray, CT scan, or MRI. If your nerves have been damaged, you may also have other tests to find out how much damage there is. How is this treated? Treatment for this condition depends on the specifics of the injury. Most fractures can be treated with:  A back brace.  Bed rest and activity restrictions.  Pain medicine.  Physical therapy. Fractures that are complex, involve multiple bones, or make the spine unstable may require surgery to remove pressure from the nerves or spinal cord and to stabilize the broken pieces of bone. During recovery, it is normal to have pain and stiffness in the back for weeks. Follow these instructions at home: Medicines  Take medicines only as directed by your health care provider.  Do not drive or operate heavy machinery while taking pain medicine. Activity  Stay in bed for as long as directed by your health care provider.  If you were shown how to do any exercises to improve motion and strength in your back, do them as directed by your health care  provider.  Return to your normal activities as directed by your health care provider. Ask your health care provider what activities are safe for you. General instructions  If you were given a neck brace or back brace, wear it as directed by your health care provider.  Keep all follow-up visits as directed by your health care provider. This is important. Failure to follow-up as recommended could result in permanent injury, disability, and long-lasting (chronic) pain. Contact a health care provider if:  Your pain does not improve over time.  You have a persistent cough.  You cannot return to your normal activities as planned or expected. Get help right away if:  You have severe pain or your pain suddenly gets worse.  You are unable to move.  You have numbness, tingling, weakness, or paralysis in any part of your body.  You cannot control your bladder or bowel.  You have difficulty breathing.  You have a fever.  You have pain in your chest or abdomen.  You vomit. This information is not intended to replace advice given to you by your health care provider. Make sure you discuss any questions you have with your health care provider. Document Released: 07/07/2006 Document Revised: 08/28/2015 Document Reviewed: 03/18/2014  2017 Elsevier   Vertebral Fracture Introduction A vertebral fracture means that one of the bones in the spine is broken (fractured). These bones are called vertebrae. You may have back pain that gets worse  when you move. Vertebral fractures can be mild or severe. Many will get better without surgery. Surgery may be needed for more severe breaks. Follow these instructions at home: General instructions  Take medicines only as told by your doctor.  Do not drive or use heavy machinery while taking pain medicine.  If told, put ice on the injured area:  Put ice in a plastic bag.  Place a towel between your skin and the bag.  Leave the ice on for 30 minutes  every 2 hours at first. Then use the ice as needed.  Wear your neck brace or back brace as told by your doctor.  Do not drink alcohol.  Keep all follow-up visits as told by your doctor. This is important. Activity  Stay in bed (on bed rest) only as told by your doctor. Being on bed rest for too long can make your condition worse.  Return to your normal activities as told by your doctor. Ask your doctor what is safe for you to do.  Do exercises for your back (physical therapy) as told by your doctor.  Exercise often as told by your doctor. Contact a doctor if:  You have a fever.  You have a cough that makes your pain worse.  Your pain medicine is not helping.  Your pain does not get better over time.  You cannot return to your normal activities as planned. Get help right away if:  Your pain is very bad and it suddenly gets worse.  You are not able to move any body part (paralysis) that is below the level of your injury.  You have numbness, tingling, or weakness in any body part that is below the level of your injury.  You cannot control when you pee (urinate) or when you poop (have bowel movements). This information is not intended to replace advice given to you by your health care provider. Make sure you discuss any questions you have with your health care provider. Document Released: 09/09/2009 Document Revised: 08/28/2015 Document Reviewed: 03/27/2014  2017 Elsevier    Hypoglycemia  Hypoglycemia is when the sugar (glucose) level in the blood is too low. Symptoms of low blood sugar may include:  Feeling:  Hungry.  Worried or nervous (anxious).  Sweaty and clammy.  Confused.  Dizzy.  Sleepy.  Sick to your stomach (nauseous).  Having:  A fast heartbeat.  A headache.  A change in your vision.  Jerky movements that you cannot control (seizure).  Nightmares.  Tingling or no feeling (numbness) around the mouth, lips, or tongue.  Having trouble  with:  Talking.  Paying attention (concentrating).  Moving (coordination).  Sleeping.  Shaking.  Passing out (fainting).  Getting upset easily (irritability). Low blood sugar can happen to people who have diabetes and people who do not have diabetes. Low blood sugar can happen quickly, and it can be an emergency. Treating Low Blood Sugar  Low blood sugar is often treated by eating or drinking something sugary right away. If you can think clearly and swallow safely, follow the 15:15 rule:  Take 15 grams of a fast-acting carb (carbohydrate). Some fast-acting carbs are:  1 tube of glucose gel.  3 sugar tablets (glucose pills).  6-8 pieces of hard candy.  4 oz (120 mL) of fruit juice.  4 oz (120 mL) of regular (not diet) soda.  Check your blood sugar 15 minutes after you take the carb.  If your blood sugar is still at or below 70 mg/dL (3.9  mmol/L), take 15 grams of a carb again.  If your blood sugar does not go above 70 mg/dL (3.9 mmol/L) after 3 tries, get help right away.  After your blood sugar goes back to normal, eat a meal or a snack within 1 hour. Treating Very Low Blood Sugar  If your blood sugar is at or below 54 mg/dL (3 mmol/L), you have very low blood sugar (severe hypoglycemia). This is an emergency. Do not wait to see if the symptoms will go away. Get medical help right away. Call your local emergency services (911 in the [place] Do not drive yourself to the hospital. If you have very low blood sugar and you cannot eat or drink, you may need a glucagon shot (injection). A family member or friend should learn how to check your blood sugar and how to give you a glucagon shot. Ask your doctor if you need to have a glucagon shot kit at home. Follow these instructions at home: General instructions  Avoid any diets that cause you to not eat enough food. Talk with your doctor before you start any new diet.  Take over-the-counter and prescription medicines only as told  by your doctor.  Limit alcohol to no more than 1 drink per day for nonpregnant women and 2 drinks per day for men. One drink equals 12 oz of beer, 5 oz of wine, or 1 oz of hard liquor.  Keep all follow-up visits as told by your doctor. This is important. If You Have Diabetes:   Make sure you know the symptoms of low blood sugar.  Always keep a source of sugar with you, such as:  Sugar.  Sugar tablets.  Glucose gel.  Fruit juice.  Regular soda (not diet soda).  Milk.  Hard candy.  Honey.  Take your medicines as told.  Follow your exercise and meal plan.  Eat on time. Do not skip meals.  Follow your sick day plan when you cannot eat or drink normally. Make this plan ahead of time with your doctor.  Check your blood sugar as often as told by your doctor. Always check before and after exercise.  Share your diabetes care plan with:  Your work or school.  People you live with.  Check your pee (urine) for ketones:  When you are sick.  As told by your doctor.  Carry a card or wear jewelry that says you have diabetes. If You Have Low Blood Sugar From Other Causes:   Check your blood sugar as often as told by your doctor.  Follow instructions from your doctor about what you cannot eat or drink. Contact a doctor if:  You have trouble keeping your blood sugar in your target range.  You have low blood sugar often. Get help right away if:  You still have symptoms after you eat or drink something sugary.  Your blood sugar is at or below 54 mg/dL (3 mmol/L).  You have jerky movements that you cannot control.  You pass out. These symptoms may be an emergency. Do not wait to see if the symptoms will go away. Get medical help right away. Call your local emergency services (911 in the [place] Do not drive yourself to the hospital.  This information is not intended to replace advice given to you by your health care provider. Make sure you discuss any questions you  have with your health care provider. Document Released: 06/16/2009 Document Revised: 08/28/2015 Document Reviewed: 04/25/2015 Elsevier Interactive Patient Education  2017 [employer]  Blood Glucose Monitoring, Adult Monitoring your blood sugar (glucose) helps you manage your diabetes. It also helps you and your health care provider determine how well your diabetes management plan is working. Blood glucose monitoring involves checking your blood glucose as often as directed, and keeping a record (log) of your results over time. Why should I monitor my blood glucose? Checking your blood glucose regularly can:  Help you understand how food, exercise, illnesses, and medicines affect your blood glucose.  Let you know what your blood glucose is at any time. You can quickly tell if you are having low blood glucose (hypoglycemia) or high blood glucose (hyperglycemia).  Help you and your health care provider adjust your medicines as needed. When should I check my blood glucose? Follow instructions from your health care provider about how often to check your blood glucose. This may depend on:  The type of diabetes you have.  How well-controlled your diabetes is.  Medicines you are taking. If you have type 1 diabetes:  Check your blood glucose at least 2 times a day.  Also check your blood glucose:  Before every insulin injection.  Before and after exercise.  Between meals.  2 hours after a meal.  Occasionally between 2:00 a.m. and 3:00 a.m., as directed.  Before potentially dangerous tasks, like driving or using heavy machinery.  At bedtime.  You may need to check your blood glucose more often, up to 6-10 times a day:  If you use an insulin pump.  If you need multiple daily injections (MDI).  If your diabetes is not well-controlled.  If you are ill.  If you have a history of severe hypoglycemia.  If you have a history of not knowing when your blood glucose is getting  low (hypoglycemia unawareness). If you have type 2 diabetes:  If you take insulin or other diabetes medicines, check your blood glucose at least 2 times a day.  If you are on intensive insulin therapy, check your blood glucose at least 4 times a day. Occasionally, you may also need to check between 2:00 a.m. and 3:00 a.m., as directed.  Also check your blood glucose:  Before and after exercise.  Before potentially dangerous tasks, like driving or using heavy machinery.  You may need to check your blood glucose more often if:  Your medicine is being adjusted.  Your diabetes is not well-controlled.  You are ill. What is a blood glucose log?  A blood glucose log is a record of your blood glucose readings. It helps you and your health care provider:  Look for patterns in your blood glucose over time.  Adjust your diabetes management plan as needed.  Every time you check your blood glucose, write down your result and notes about things that may be affecting your blood glucose, such as your diet and exercise for the day.  Most glucose meters store a record of glucose readings in the meter. Some meters allow you to download your records to a computer. How do I check my blood glucose? Follow these steps to get accurate readings of your blood glucose: Supplies needed   Blood glucose meter.  Test strips for your meter. Each meter has its own strips. You must use the strips that come with your meter.  A needle to prick your finger (lancet). Do not use lancets more than once.  A device that holds the lancet (lancing device).  A journal or log book to write down your results. Procedure  Wash your hands with soap and water.  Prick the side of your finger (not the tip) with the lancet. Use a different finger each time.  Gently rub the finger until a small drop of blood appears.  Follow instructions that come with your meter for inserting the test strip, applying blood to the  strip, and using your blood glucose meter.  Write down your result and any notes. Alternative testing sites  Some meters allow you to use areas of your body other than your finger (alternative sites) to test your blood.  If you think you may have hypoglycemia, or if you have hypoglycemia unawareness, do not use alternative sites. Use your finger instead.  Alternative sites may not be as accurate as the fingers, because blood flow is slower in these areas. This means that the result you get may be delayed, and it may be different from the result that you would get from your finger.  The most common alternative sites are:  Forearm.  Thigh.  Palm of the hand. Additional tips  Always keep your supplies with you.  If you have questions or need help, all blood glucose meters have a 24-hour hotline number that you can call. You may also contact your health care provider.  After you use a few boxes of test strips, adjust (calibrate) your blood glucose meter by following instructions that came with your meter. This information is not intended to replace advice given to you by your health care provider. Make sure you discuss any questions you have with your health care provider. Document Released: 03/25/2003 Document Revised: 10/10/2015 Document Reviewed: 09/01/2015 Elsevier Interactive Patient Education  2017 [employer].

## 2016-03-01 NOTE — Progress Notes (Signed)
Inpatient Diabetes Program Recommendations  AACE/ADA: New Consensus Statement on Inpatient Glycemic Control (2015)  Target Ranges:  Prepandial:   less than 140 mg/dL      Peak postprandial:   less than 180 mg/dL (1-2 hours)      Critically ill patients:  140 - 180 mg/dL   Lab Results  Component Value Date   GLUCAP 189 (H) 03/01/2016   HGBA1C 9.3 (H) 02/27/2016    Review of Glycemic Control  Diabetes history: DM2 Outpatient Diabetes medications: None Current orders for Inpatient glycemic control: Novolog 0-9 units [facility]  Inpatient Diabetes Program Recommendations:      A1C elevated with this admission.  Please consider discharging patient on DM medications and encourage follow up with PCP to manage DM long-term upon discharge.  Thank you,  Windy Carina, RN, BSN Diabetes Coordinator Inpatient Diabetes Program [phone] (Team Pager)

## 2016-03-01 NOTE — Progress Notes (Signed)
Patient will DC to: [facility] Anticipated DC date: 03/01/16 Family notified: Son and spouse Transport by: Sharl Ma   Per MD patient ready for DC to [facility] RN, patient, patient's family, and facility notified of DC. Discharge Summary sent to facility. RN given number for report. DC packet on chart. Ambulance transport requested for patient.   CSW signing off.  Cedric Fishman, [place] Social Worker [phone]

## 2016-03-01 NOTE — Discharge Summary (Addendum)
Physician Discharge Summary  Karen Arroyo [id number] DOB: 03/21/1934 DOA: 02/26/2016  PCP: Tivis Ringer, MD  Admit date: 02/26/2016 Discharge date: 03/01/2016  Admitted From: Home  Disposition:  SNF  Recommendations for Outpatient Follow-up:  1. Follow up with cardiology and PCP in 1-2 weeks 2. Please obtain BMP/CBC in one week 3. MONITOR BLOOD SUGARS 4 TIMES PER DAY AND ADJUST INSULIN DOSES AS NEEDED  Discharge Condition: STABLE  CODE STATUS: FULL Diet recommendation: Heart Healthy / Carb Modified  Brief/Interim Summary: HPI: Karen Arroyo is a 82 y.o. female with history of CAD status post CABG, chronic atrial fibrillation, diabetes mellitus, memory issues presents to the ER because of persistent low back pain since fall last week. As per patient's son who provided the history patient had a fall last week while going to the bathroom. Patient had a syncopal episode and has had recurrent syncopal episode previously with extensive workup with cardiology. After last week's syncopal episode and fall, as per the patient's son patient had come to the ER and had CT head done which was unremarkable and patient was discharged home on Robaxin for pain. Subsequent to that patient started developing worsening pain in the low back and patient's primary care prescribed tramadol. Despite taking tramadol and Robaxin patient's pain worsened and came to the ER. X-rays reveal lumbar fracture and on-call neurosurgeon Dr. Saintclair Halsted was consulted and patient is being placed on TLSO brace and neurosurgery will be seeing patient in consult.   ED Course: See history of presenting illness. Patient was given hydralazine for elevated blood pressure.  Brief Narrative:  82 y/o F with PMHx of CAD, s/p CABG, Chronic Afib, DM type II, Dementia presented to ED with persistent back pain since a fall last week. Patient had  A syncopal episode causing the fall, in the ER had a CT which was unremarkable and was discharge  home on Robaxin for pain. Pain got worse came to the ED and lumbar xray revealed a L1 compression fracture, Neurosurgery was consulted whom recommended conservative treatment with TLSO. Repeat xray shows better alignment when patient wearing the brace. On day 2 of hospital stay patient developed chest pain, SOB and went into Afib with RVR with HR up to 160's, Cardizem was given and cardiac enzymes were obtained.   Subjective: Patient doing much better today denies chest pain and shortness of breath. TNI peaked at 5.14 now trending down. Son is concerned about itchiness. Pain is well controlled with current pain medications.  Assessment & Plan: CAD s/p CABG -  NSTEMI - no chest pain today TNI peaked at 5.14 now trending down Cardiology recommendations appreciated On ASA and heparin drip - plan to switch to oral [facility] tomorrow, will keep on Xarelto and ASA O2 PRN  Metoprolol increased to 50mg  BID  Increased statin to atorvastatin 80 mg daily ECHO done 11/27: Study Conclusions - Left ventricle: Some septal flattening indicating high right sided pressures. Systolic function was normal. The estimated ejection fraction was in the range of 50% to 55%. - Mitral valve: There was mild regurgitation. - Right ventricle: The cavity size was moderately dilated. - Right atrium: The atrium was mildly dilated. - Atrial septum: No defect or patent foramen ovale was identified. - Tricuspid valve: There was severe regurgitation.  Afib with RVR - remains in afib, rated better controled now  Cardizem x 1 given - rate was controlled - patient remained in afib Metoprolol dose was increased to 25 mg BID  Cardio consult  Continue  tele monitoring - Resuming Xarelto daily 15 mg for anticoagulation  L1 Compression Fracture - xray with brace better alignment - no changes  Conservative treatment with TLSO brace  Neurosurgery recommendations appreciated  Pain control as needed   Hypertension - stable  BB  increased Monitor BP  Hydralazine PRN   DM type 2 - CBG's stable  DISCHARGING ON LANTUS AND NOVOLOG  Monitor CBG   CBG (last 3)   Recent Labs  02/29/16 2231 03/01/16 0801 03/01/16 1230  GLUCAP 188* 189* 207*   Dementia - much better today - back to baseline  Pain control  Increase room lighting  Continue Aricept  DVT prophylaxis:  XARELTO  Code Status: Full  Family Communication: Son at bedside plan of care discussed in details  Disposition Plan: SNF   Consultants:   Neurosurgery   cardiology  Procedures:   Echocardiogram  Discharge Diagnoses:  Principal Problem:   Compression fracture of first lumbar vertebra ([place]) Active Problems:   Atrial fibrillation ([place])   Essential hypertension   Hx of CABG   Lumbar compression fracture ([place])   NSTEMI (non-ST elevated myocardial infarction) [facility])  Discharge Instructions    Medication List    STOP taking these medications   traMADol 50 MG tablet Commonly known as:  ULTRAM     TAKE these medications   ALIGN 4 MG Caps Take 1 capsule by mouth daily.   ammonium lactate 12 % cream Commonly known as:  AMLACTIN Apply 1 g topically as needed for dry skin.   aspirin EC 81 MG tablet Take 81 mg by mouth at bedtime.   atorvastatin 80 MG tablet Commonly known as:  LIPITOR Take 1 tablet (80 mg total) by mouth daily. What changed:  See the new instructions.   CALTRATE 600 PLUS-VIT D PO Take 1 tablet by mouth daily.   citalopram 20 MG tablet Commonly known as:  CELEXA Take 1 tablet by mouth every evening.   docusate sodium 100 MG capsule Commonly known as:  COLACE Take 2 capsules (200 mg total) by mouth 2 (two) times daily.   donepezil 10 MG tablet Commonly known as:  ARICEPT Take 1 tablet (10 mg total) by mouth daily.   feeding supplement (GLUCERNA SHAKE) Liqd Take 237 mLs by mouth 2 (two) times daily between meals.   fexofenadine 60 MG tablet Commonly known as:  ALLEGRA Take 60 mg by mouth  daily as needed for allergies or rhinitis.   insulin aspart 100 UNIT/ML injection Commonly known as:  NOVOLOG Inject 2 Units into the skin 3 (three) times daily with meals. ONLY GIVE IF EATS 50% OR MORE OF MEAL   insulin glargine 100 UNIT/ML injection Commonly known as:  LANTUS Inject 0.08 mLs (8 Units total) into the skin at bedtime.   JOINT SUPPORT PO Take 1-2 capsules by mouth daily.   loperamide 2 MG capsule Commonly known as:  IMODIUM Take 2 mg by mouth daily as needed for diarrhea or loose stools.   methocarbamol 500 MG tablet Commonly known as:  ROBAXIN Take 1 tablet (500 mg total) by mouth every 8 (eight) hours as needed for muscle spasms.   metoprolol 50 MG tablet Commonly known as:  LOPRESSOR Take 1 tablet (50 mg total) by mouth 2 (two) times daily. What changed:  medication strength  how much to take   mirabegron ER 25 MG Tb24 tablet Commonly known as:  MYRBETRIQ Take 25 mg by mouth daily.   MULTIVITAL PO Take 1 capsule by mouth daily.  nitroGLYCERIN 0.4 MG SL tablet Commonly known as:  NITROSTAT Place 0.4 mg under the tongue every 5 (five) minutes as needed for chest pain ((MAX 3 doses)). Reported on 06/05/2015   oxyCODONE 5 MG immediate release tablet Commonly known as:  Oxy IR/ROXICODONE Take 1 tablet (5 mg total) by mouth every 4 (four) hours as needed for moderate pain or severe pain.   potassium chloride 10 MEQ tablet Commonly known as:  K-DUR Take 10 mEq by mouth daily.   Rivaroxaban 15 MG Tabs tablet Commonly known as:  XARELTO Take 1 tablet (15 mg total) by mouth daily with supper.   Vitamin D3 2000 units Tabs Take 1 tablet by mouth at bedtime.       Contact information for follow-up providers    Pixie Casino, MD. Schedule an appointment as soon as possible for a visit in 2 week(s).   Specialty:  Cardiology Why:  Hospital Follow Up Contact information: [place] [address] [phone]         Tivis Ringer, MD. Schedule an appointment as soon as possible for a visit in 2 week(s).   Specialty:  Internal Medicine Why:  Hospital Follow Up Contact information: [address] [place] [address] [phone]            Contact information for after-discharge care    Destination    [facility] .   Specialty:  [place] information: [address] [place] [phone]                 Allergies  Allergen Reactions  . Aspirin     Rash - only in high doses  . Dimetapp Cold-Allergy     Rash   . Ilosone [Erythromycin Estolate]     rash  . Morphine And Related     rash  . Penicillins     Rash Has patient had a PCN reaction causing immediate rash, facial/tongue/throat swelling, SOB or lightheadedness with hypotension: YES Has patient had a PCN reaction causing severe rash involving mucus membranes or skin necrosis:NO Has patient had a PCN reaction that required hospitalization NO Has patient had a PCN reaction occurring within the last 10 years:NO If all of the above answers are "NO", then may proceed with Cephalosporin use.  Marland Kitchen Restoril     rash  . Zolpidem Tartrate     rash    Procedures/Studies: Ct Head Wo Contrast  Result Date: 02/19/2016 CLINICAL DATA:  Patient fell from bed this afternoon. Syncope. On Xarelto. EXAM: CT HEAD WITHOUT CONTRAST TECHNIQUE: Contiguous axial images were obtained from the base of the skull through the vertex without intravenous contrast. COMPARISON:  03/12/2015 FINDINGS: BRAIN: There is stable mild sulcal and moderate-to-marked ventricular prominence consistent with superficial and central atrophy. No intraparenchymal hemorrhage, mass effect nor midline shift. There is a moderate degree of patchy periventricular and subcortical white matter hypodensities consistent with chronic small vessel ischemic disease. No acute large vascular territory infarcts. No  abnormal extra-axial fluid collections. Basal cisterns are patent. Small bilateral basal ganglial lacunes superior chronic. VASCULAR: Moderate calcific atherosclerosis of the carotid siphons. SKULL: No skull fracture. No significant scalp soft tissue swelling. SINUSES/ORBITS: The mastoid air-cells are clear. The included paranasal sinuses are well-aerated.The included ocular globes and orbital contents are non-suspicious. Well-circumscribed benign-appearing 7 mm rounded bone cyst along the periphery the right orbit. OTHER: None. IMPRESSION: Chronic moderate periventricular and subcortical white matter small vessel ischemic disease.  Chronic bilateral small basal ganglial lacunar infarcts. No acute intracranial hemorrhage, midline shift or edema. No acute fracture. Electronically Signed   By: Ashley Royalty M.D.   On: 02/19/2016 23:35   Ct Lumbar Spine Wo Contrast  Result Date: 02/27/2016 CLINICAL DATA:  82 y/o  F; back pain after a fall. EXAM: CT LUMBAR SPINE WITHOUT CONTRAST TECHNIQUE: Multidetector CT imaging of the lumbar spine was performed without intravenous contrast administration. Multiplanar CT image reconstructions were also generated. COMPARISON:  CT abdomen and pelvis 07/09/2005 FINDINGS: Segmentation: 5 lumbar type vertebrae. Alignment: Normal. Vertebrae: Moderate L1 superior endplate compression deformity within acute appearance. There is retropulsion of the posterior margin and lateral margins of the superior endplate into the central, left subarticular, left foraminal, and left extra foraminal zones. Mild resultant bony canal stenosis. No other fracture is identified. Paraspinal and other soft tissues: Severe calcific atherosclerosis of the abdominal aorta. Gallstones. There is mild paraspinal edema at the level of the L1 vertebral body. No hematoma identified. Punctate densities within the right renal hilum probably represent vascular calcifications, less likely nephrolithiasis. Disc levels: Lumbar  spondylosis and facet arthropathy without high-grade bony canal stenosis or foraminal narrowing. IMPRESSION: Moderate L1 superior endplate compression deformity with acute appearance and retropulsion of the posterior aspect of the superior endplate into the spinal canal with mild resultant bony canal stenosis. MRI can better assess degree of neural impingement if clinically indicated. No other fracture identified. Electronically Signed   By: Kristine Garbe M.D.   On: 02/27/2016 00:31   Mr Lumbar Spine Wo Contrast  Result Date: 02/27/2016 CLINICAL DATA:  82 y/o  F; L1 compression deformity. EXAM: MRI LUMBAR SPINE WITHOUT CONTRAST TECHNIQUE: Multiplanar, multisequence MR imaging of the lumbar spine was performed. No intravenous contrast was administered. COMPARISON:  02/26/2016 lumbar CT. FINDINGS: Segmentation:  Standard. Alignment:  Physiologic. Vertebrae: Superior endplate deformity of L1 vertebral body with moderate loss of height and mild retropulsion of the posterior aspect of the superior endplate. There is edema within the vertebral body extending into the pedicles. No evidence for additional fracture, diskitis, or suspicious osseous lesion. Prominent S2 Tarlov cysts. Conus medullaris: Extends to the L1-2 level and appears normal. Paraspinal and other soft tissues: Left kidney lower pole T2 hyperintense structure measuring 18 mm probably representing a cyst. Disc levels: T12-L1: The retropulsed superior endplate of L1 effaces the anterior thecal sac. There is no cord impingement or significant canal stenosis. Neural foramen are patent. L1-2: No significant disc displacement, foraminal narrowing, or canal stenosis. L2-3: Minimal disc bulge without significant foraminal narrowing or canal stenosis. L3-4: Minimal disc bulge without significant foraminal narrowing or canal stenosis. L4-5: Small disc bulge and mild facet/ligamentum flavum hypertrophy. Mild bilateral foraminal and lateral recess  narrowing. No significant canal stenosis. Trace facet effusions. L5-S1: Minimal disc bulge mild bilateral facet hypertrophy. No significant foraminal narrowing or canal stenosis. IMPRESSION: L1 superior end plate acute moderate compression deformity. Mild retropulsion of the superior endplate effaces the anterior thecal sac of the spinal canal at that level without significant canal stenosis. There is no evidence for neural impingement. Electronically Signed   By: Kristine Garbe M.D.   On: 02/27/2016 05:08   Dg Lumbar Spine 1vclearing  Result Date: 02/27/2016 CLINICAL DATA:  L1 fracture. EXAM: LIMITED LUMBAR SPINE FOR TRAUMA CLEARING - 1 VIEW COMPARISON:  Radiographs and MRI of same day. FINDINGS: Moderate compression deformity of L1 vertebral body is noted consistent with fracture. Atherosclerosis of thoracic aorta is noted. Mild diffuse osteopenia is  noted. No spondylolisthesis is noted. Remaining vertebral bodies appear intact. Disc spaces are well-maintained. IMPRESSION: Aortic atherosclerosis. Moderate compression deformity of L1 vertebral body consistent with fracture. Electronically Signed   By: Marijo Conception, M.D.   On: 02/27/2016 18:01   Dg Lumbar Spine 1vclearing  Result Date: 02/27/2016 CLINICAL DATA:  Known L1 compression deformity EXAM: LIMITED LUMBAR SPINE FOR TRAUMA CLEARING - 1 VIEW COMPARISON:  CT from 02/26/2016 and MRI from 02/27/2016 FINDINGS: L1 compression deformity is noted similar to that seen on prior CT and MRI. Slight increase kyphosis is noted at L1. Remainder of the lumbar vertebra show normal vertebral height. Mild anterolisthesis of L4 on L5 is noted. Facet hypertrophic changes are seen. Diffuse aortic calcifications are noted. IMPRESSION: L1 compression deformity similar to that noted on prior CT and MR. Degenerative changes as described. Electronically Signed   By: Inez Catalina M.D.   On: 02/27/2016 11:33   Subjective: Pt without complaints, reports feeling  much better today.   Discharge Exam: Vitals:   02/29/16 2200 03/01/16 0425  BP: (!) 160/108 (!) 147/88  Pulse: (!) 108 80  Resp: 20 19  Temp: 98.3 F (36.8 C) 98 F (36.7 C)   Vitals:   02/29/16 0859 02/29/16 1403 02/29/16 2200 03/01/16 0425  BP:  110/64 (!) 160/108 (!) 147/88  Pulse: 90 83 (!) 108 80  Resp:   20 19  Temp:  98 F (36.7 C) 98.3 F (36.8 C) 98 F (36.7 C)  TempSrc:  Oral  Oral  SpO2:  96% 97% 94%  Weight:      Height:       General: Pt is alert, awake, not in acute distress Cardiovascular: RRR, S1/S2 +, no rubs, no gallops Respiratory: CTA bilaterally, no wheezing, no rhonchi Abdominal: Soft, NT, ND, bowel sounds + Extremities: no edema, no cyanosis  The results of significant diagnostics from this hospitalization (including imaging, microbiology, ancillary and laboratory) are listed below for reference.     Microbiology: No results found for this or any previous visit (from the past 240 hour(s)).   Labs: BNP (last 3 results) No results for input(s): BNP in the last 8760 hours. Basic Metabolic Panel:  Recent Labs Lab 02/26/16 2324 02/27/16 1048 02/28/16 0343 03/01/16 0818  NA 136 138 137 135  K 2.9* 2.9* 4.9 4.5  CL 100* 101 106 105  CO2 24 29 23  21*  GLUCOSE 296* 162* 178* 191*  BUN 14 8 10 19   CREATININE 1.11* 0.86 0.81 0.99  CALCIUM 9.2 9.2 9.1 9.2  MG  --   --  1.8  --    Liver Function Tests:  Recent Labs Lab 02/26/16 2324 02/27/16 1048  AST 19 20  ALT 14 16  ALKPHOS 83 74  BILITOT 0.7 1.1  PROT 6.1* 5.9*  ALBUMIN 3.7 3.6   No results for input(s): LIPASE, AMYLASE in the last 168 hours. No results for input(s): AMMONIA in the last 168 hours. CBC:  Recent Labs Lab 02/26/16 2324 02/27/16 1048 02/28/16 0343 02/29/16 0136 03/01/16 0818  WBC 6.2 8.5 6.7 7.8 6.8  NEUTROABS 4.4 6.9  --   --   --   HGB 13.4 13.2 12.9 12.4 12.2  HCT 39.1 38.9 38.5 37.2 36.6  MCV 85.4 85.7 86.1 86.9 86.5  PLT 206 209 216 221 203    Cardiac Enzymes:  Recent Labs Lab 02/28/16 0947  02/28/16 1848 02/28/16 2136 02/29/16 0136 02/29/16 0819 03/01/16 0818  CKTOTAL 36*  --   --   --   --   --   --  CKMB 2.3  --   --   --   --   --   --   TROPONINI  --   < > 4.15* 5.14* 4.07* 2.44* 1.13*  < > = values in this interval not displayed. BNP: Invalid input(s): POCBNP CBG:  Recent Labs Lab 02/29/16 1200 02/29/16 1732 02/29/16 2231 03/01/16 0801 03/01/16 1230  GLUCAP 172* 207* 188* 189* 207*   D-Dimer No results for input(s): DDIMER in the last 72 hours. Hgb A1c No results for input(s): HGBA1C in the last 72 hours. Lipid Profile No results for input(s): CHOL, HDL, LDLCALC, TRIG, CHOLHDL, LDLDIRECT in the last 72 hours. Thyroid function studies No results for input(s): TSH, T4TOTAL, T3FREE, THYROIDAB in the last 72 hours.  Invalid input(s): FREET3 Anemia work up No results for input(s): VITAMINB12, FOLATE, FERRITIN, TIBC, IRON, RETICCTPCT in the last 72 hours. Urinalysis    Component Value Date/Time   COLORURINE YELLOW 02/26/2016 2309   APPEARANCEUR CLEAR 02/26/2016 2309   LABSPEC 1.007 02/26/2016 2309   PHURINE 6.0 02/26/2016 2309   GLUCOSEU 100 (A) 02/26/2016 2309   HGBUR NEGATIVE 02/26/2016 2309   BILIRUBINUR NEGATIVE 02/26/2016 2309   KETONESUR NEGATIVE 02/26/2016 2309   PROTEINUR NEGATIVE 02/26/2016 2309   NITRITE NEGATIVE 02/26/2016 2309   LEUKOCYTESUR NEGATIVE 02/26/2016 2309   Sepsis Labs Invalid input(s): PROCALCITONIN,  WBC,  LACTICIDVEN Microbiology No results found for this or any previous visit (from the past 240 hour(s)).  Time coordinating discharge: 33 minutes  SIGNED:  Irwin Brakeman, MD  [facility] 03/01/2016, 4:13 PM Pager   If 7PM-7AM, please contact night-coverage [email or web address] Password [email or web address]

## 2016-03-01 NOTE — Progress Notes (Signed)
.       Patient Name: Karen Arroyo      SUBJECTIVE: 82 year old woman admitted following a syncopal episode week ago when she hurt her back. She was readmitted because of progressive back pain. Neurosurgery has seen her. She is found to have a burst fracture.  She has persistent/permanent atrial fibrillation with intermittent "spells" presuming related to rapid rates.   11/20 5 AM awakened with atrial fibrillation and a rapid rate complaining of chest pain. Troponins were abnormal 0.03>> 2.23>>5.14 now decreasing. Plan medical therapy given baseline dementia as outlined by Dr Caryl Comes.  No CP or dyspnea.    Past Medical History:  Diagnosis Date  . Coronary artery disease   . Diabetes mellitus   . Diastolic dysfunction   . Dyslipidemia   . Edema   . HX: long term anticoagulant use   . MI, old 37   involving small LCX  . PAF (paroxysmal atrial fibrillation) ([place])   . Pulmonary hypertension   . S/P CABG (coronary artery bypass graft) 2000  . Skin cancer    nose, leg    Scheduled Meds:  Scheduled Meds: . aspirin EC  81 mg Oral QHS  . atorvastatin  80 mg Oral Daily  . cholecalciferol  2,000 Units Oral QHS  . citalopram  20 mg Oral QPM  . donepezil  10 mg Oral Daily  . feeding supplement (GLUCERNA SHAKE)  237 mL Oral BID BM  . insulin aspart  0-9 Units Subcutaneous TID WC  . loratadine  10 mg Oral Daily  . metoprolol tartrate  50 mg Oral BID  . mirabegron ER  25 mg Oral Daily  . potassium chloride  10 mEq Oral Daily   Continuous Infusions: . heparin 850 Units/hr (03/01/16 1014)   acetaminophen **OR** acetaminophen, diphenhydrAMINE, fentaNYL (SUBLIMAZE) injection, hydrALAZINE, loperamide, methocarbamol, ondansetron (ZOFRAN) IV, ondansetron **OR** ondansetron (ZOFRAN) IV, oxyCODONE    PHYSICAL EXAM Vitals:   02/29/16 0859 02/29/16 1403 02/29/16 2200 03/01/16 0425  BP:  110/64 (!) 160/108 (!) 147/88  Pulse: 90 83 (!) 108 80  Resp:   20 19  Temp:  98 F (36.7  C) 98.3 F (36.8 C) 98 F (36.7 C)  TempSrc:  Oral  Oral  SpO2:  96% 97% 94%  Weight:      Height:       Well developed and nourished in no acute distress   HENT normal Neck supple Chest CTA Irregular Abd-soft No edema Skin-warm and dry A & Oriented X1  Grossly normal sensory and motor function   TELEMETRY: Reviewed personnally pt in atrial fibrillation; rate controlled   Intake/Output Summary (Last 24 hours) at 03/01/16 1119 Last data filed at 03/01/16 0926  Gross per 24 hour  Intake              600 ml  Output                0 ml  Net              600 ml    LABS: Basic Metabolic Panel:  Recent Labs Lab 02/26/16 2324 02/27/16 1048 02/28/16 0343 03/01/16 0818  NA 136 138 137 135  K 2.9* 2.9* 4.9 4.5  CL 100* 101 106 105  CO2 24 29 23  21*  GLUCOSE 296* 162* 178* 191*  BUN 14 8 10 19   CREATININE 1.11* 0.86 0.81 0.99  CALCIUM 9.2 9.2 9.1 9.2  MG  --   --  1.8  --  Cardiac Enzymes:  Recent Labs  02/28/16 0947  02/29/16 0136 02/29/16 0819 03/01/16 0818  CKTOTAL 36*  --   --   --   --   CKMB 2.3  --   --   --   --   TROPONINI  --   < > 4.07* 2.44* 1.13*  < > = values in this interval not displayed. CBC:  Recent Labs Lab 02/26/16 2324 02/27/16 1048 02/28/16 0343 02/29/16 0136 03/01/16 0818  WBC 6.2 8.5 6.7 7.8 6.8  NEUTROABS 4.4 6.9  --   --   --   HGB 13.4 13.2 12.9 12.4 12.2  HCT 39.1 38.9 38.5 37.2 36.6  MCV 85.4 85.7 86.1 86.9 86.5  PLT 206 209 216 221 203     ASSESSMENT AND PLAN:  Principal Problem:   Compression fracture of first lumbar vertebra ([facility]) Active Problems:   Atrial fibrillation ([facility])   Essential hypertension   Hx of CABG   Lumbar compression fracture ([facility])   NSTEMI (non-ST elevated myocardial infarction) ([place])   1 non-ST elevation myocardial infarction-as outlined by Dr. Caryl Comes we plan medical therapy with no cardiac catheterization. This was previously discussed with family. They agree with medical therapy.  Continue ASA 81 mg daily; no plavix given need for xarelto. Continue statin and metoprolol.   2 persistent atrial fibrillation-continue metoprolol for rate control. Discontinue IV heparin. Resume xarelto per pharmacy.  3 Burst fracture-management per neurosurgery.   4 Dementia   Signed, Kirk Ruths MD  03/01/2016

## 2016-03-01 NOTE — Progress Notes (Addendum)
ANTICOAGULATION CONSULT NOTE - FOLLOW UP  Pharmacy Consult:  Heparin Indication: atrial fibrillation  Allergies  Allergen Reactions  . Aspirin     Rash - only in high doses  . Dimetapp Cold-Allergy     Rash   . Ilosone [Erythromycin Estolate]     rash  . Morphine And Related     rash  . Penicillins     Rash Has patient had a PCN reaction causing immediate rash, facial/tongue/throat swelling, SOB or lightheadedness with hypotension: YES Has patient had a PCN reaction causing severe rash involving mucus membranes or skin necrosis:NO Has patient had a PCN reaction that required hospitalization NO Has patient had a PCN reaction occurring within the last 10 years:NO If all of the above answers are "NO", then may proceed with Cephalosporin use.  Marland Kitchen Restoril     rash  . Zolpidem Tartrate     rash    Patient Measurements: Height: 5\' 4"  (162.6 cm) Weight: 133 lb 3.2 oz (60.4 kg) IBW/kg (Calculated) : 54.7 Heparin Dosing Weight: 60.4 kg  Vital Signs: Temp: 98 F (36.7 C) (11/27 0425) Temp Source: Oral (11/27 0425) BP: 147/88 (11/27 0425) Pulse Rate: 80 (11/27 0425)   Assessment: 82 YOF presented on 02/26/16 with low back pain. Has a history of Afib on Xarelto at home.  Patient was transitioned to IV heparin for possible surgery, now confirmed surgery is not needed.  Heparin level is therapeutic but aPTT has trended up to slightly supra-therapeutic level.  No bleeding reported.   Goal of Therapy:  APTT 66 - 102 sec Monitor platelets by anticoagulation protocol: Yes    Plan:  - Reduce heparin gtt slightly to 850 units/hr - Daily heparin level, aPTT, CBC - F/U restart of Xarelto per Cards   Aliece Honold D. Mina Marble, PharmD, BCPS Pager:  [phone] 03/01/2016, 9:59 AM    ==========================   Addendum: - transition back to Xarelto   Plan: - Xarelto 15mg  PO daily with supper, start with lunch - D/C heparin when Xarelto is administered - Pharmacy will sign off and  follow peripherally.  Thank you for the consult!   Ayiana Winslett D. Mina Marble, PharmD, BCPS Pager:  [phone] 03/01/2016, 11:47 AM

## 2016-03-01 NOTE — Clinical Social Work Placement (Signed)
   CLINICAL SOCIAL WORK PLACEMENT  NOTE  Date:  03/01/2016  Patient Details  Name: KRISHNA XIA MRN: [id number] Date of Birth: 14-Aug-1933  Clinical Social Work is seeking post-discharge placement for this patient at the [place] level of care (*CSW will initial, date and re-position this form in  chart as items are completed):  Yes   Patient/family provided with [facility] Work Department's list of facilities offering this level of care within the geographic area requested by the patient (or if unable, by the patient's family).  Yes   Patient/family informed of their freedom to choose among providers that offer the needed level of care, that participate in Medicare, Medicaid or managed care program needed by the patient, have an available bed and are willing to accept the patient.  Yes   Patient/family informed of [facility]'s ownership interest in [facility] and [facility], as well as of the fact that they are under no obligation to receive care at these facilities.  PASRR submitted to EDS on 03/01/16     PASRR number received on 03/01/16     Existing PASRR number confirmed on       FL2 transmitted to all facilities in geographic area requested by pt/family on 03/01/16     FL2 transmitted to all facilities within larger geographic area on       Patient informed that his/her managed care company has contracts with or will negotiate with certain facilities, including the following:        Yes   Patient/family informed of bed offers received.  Patient chooses bed at [facility]     Physician recommends and patient chooses bed at      Patient to be transferred to [facility] on 03/01/16.  Patient to be transferred to facility by [facility]     Patient family notified on 03/01/16 of transfer.  Name of family member notified:  Son and spouse at bedside     PHYSICIAN Please sign FL2     Additional Comment:     _______________________________________________ Benard Halsted, [place] 03/01/2016, 4:12 PM

## 2016-03-01 NOTE — Progress Notes (Addendum)
1755 Pt is discharged to SNF, [facility], report given to [place]. [facility] not here yet, pt is on their list as confirmed by [facility], running late. 53 Pt is discharged to SNF via [facility]. Pt's spouse and son is present upon discharge.

## 2016-03-03 ENCOUNTER — Non-Acute Institutional Stay (SKILLED_NURSING_FACILITY): Payer: Medicare Other | Admitting: Family

## 2016-03-03 DIAGNOSIS — M545 Low back pain, unspecified: Secondary | ICD-10-CM

## 2016-03-03 MED ORDER — HYDROCODONE-ACETAMINOPHEN 10-325 MG PO TABS: 1.0000 | ORAL_TABLET | ORAL | 0 refills | Status: DC | PRN

## 2016-03-03 MED ORDER — METHOCARBAMOL 500 MG PO TABS: 500.0000 mg | ORAL_TABLET | Freq: Every day | ORAL | 0 refills | Status: DC

## 2016-03-03 MED ORDER — TRAMADOL HCL 50 MG PO TABS: 50.0000 mg | ORAL_TABLET | Freq: Three times a day (TID) | ORAL | 0 refills | Status: DC

## 2016-03-03 NOTE — Progress Notes (Signed)
Location:  [place] Room Number: [place] of Service:  SNF (31) Provider:  Damiel Barthold FNP-C   Tivis Ringer, MD  Patient Care Team: Prince Solian, MD as PCP - General (Internal Medicine)  Extended Emergency Contact Information Primary Emergency Contact: Jambor,James Address: [id number]          [place] Phone: [phone] Relation: Spouse Secondary Emergency Contact: Qazi,Michael Address: [address]          [place], [place] [address] [place] Phone: [phone] Mobile Phone: [phone] Relation: Son  Code Status:  Full Code  Goals of care: Advanced Directive information Advanced Directives 02/26/2016  Does Patient Have a Medical Advance Directive? No;Yes  Type of Advance Directive Living will  Would patient like information on creating a medical advance directive? -     Chief Complaint  Patient presents with  . Acute Visit    back pain     HPI:  Pt is a 82 y.o. female seen today at [facility]  for an acute visit for evaluation of back pain. She is here for short term rehabilitation post hospital admission from 02/26/2016-03/01/2016 for persistent low back pain post fall and syncope previous week. X-ray showed lumbar 1 fracture. Neurosurgeon was consulted Dr. Saintclair Halsted. She was placed on TLSO brace and pain control recommended. She has a medical history of AFib, CAD, HTN, NSTEMI, Type 2 DM among other conditions. She is seen in her room per patient's son request. Patient's son states patient current medication not working. He states patient was awake from 2 Am until 5 Am due to pain. She states pain is worse with movement. Son also request robaxin to be scheduled at bedtime for spasm relief.    Past Medical History:  Diagnosis Date  . Coronary artery disease   . Diabetes mellitus   . Diastolic dysfunction   . Dyslipidemia   . Edema   . HX: long term  anticoagulant use   . 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 PAF (paroxysmal atrial fibrillation) ([place])   . Pulmonary hypertension   . S/P CABG (coronary artery bypass graft) 2000  . Skin cancer    nose, leg   Past Surgical History:  Procedure Laterality Date  . CARDIAC CATHETERIZATION  1995   dr Oran Rein  . CORONARY ARTERY BYPASS GRAFT  2000   LIMA to LAD; SVG to intermediate, SVG to PD and RCA per Dr. Servando Snare  . OTHER SURGICAL HISTORY  1999   historectomy  . TUBAL LIGATION  1961    Allergies  Allergen Reactions  . Aspirin     Rash - only in high doses  . Dimetapp Cold-Allergy     Rash   . Ilosone [Erythromycin Estolate]     rash  . Morphine And Related     rash  . Penicillins     Rash Has patient had a PCN reaction causing immediate rash, facial/tongue/throat swelling, SOB or lightheadedness with hypotension: YES Has patient had a PCN reaction causing severe rash involving mucus membranes or skin necrosis:NO Has patient had a PCN reaction that required hospitalization NO Has patient had a PCN reaction occurring within the last 10 years:NO If all of the above answers are "NO", then may proceed with Cephalosporin use.  Marland Kitchen Restoril     rash  . Zolpidem Tartrate     rash      Medication  List       Accurate as of 03/03/16  4:56 PM. Always use your most recent med list.          ALIGN 4 MG Caps Take 1 capsule by mouth daily.   ammonium lactate 12 % cream Commonly known as:  AMLACTIN Apply 1 g topically as needed for dry skin.   aspirin EC 81 MG tablet Take 81 mg by mouth at bedtime.   atorvastatin 80 MG tablet Commonly known as:  LIPITOR Take 1 tablet (80 mg total) by mouth daily.   CALTRATE 600 PLUS-VIT D PO Take 1 tablet by mouth daily.   citalopram 20 MG tablet Commonly known as:  CELEXA Take 1 tablet by mouth every evening.   docusate sodium 100 MG capsule Commonly known as:  COLACE Take 2 capsules (200 mg total) by mouth 2 (two) times daily.     donepezil 10 MG tablet Commonly known as:  ARICEPT Take 1 tablet (10 mg total) by mouth daily.   feeding supplement (GLUCERNA SHAKE) Liqd Take 237 mLs by mouth 2 (two) times daily between meals.   fexofenadine 60 MG tablet Commonly known as:  ALLEGRA Take 60 mg by mouth daily as needed for allergies or rhinitis.   insulin aspart 100 UNIT/ML injection Commonly known as:  NOVOLOG Inject 2 Units into the skin 3 (three) times daily with meals. ONLY GIVE IF EATS 50% OR MORE OF MEAL   insulin glargine 100 UNIT/ML injection Commonly known as:  LANTUS Inject 0.08 mLs (8 Units total) into the skin at bedtime.   insulin lispro 100 UNIT/ML injection Commonly known as:  HUMALOG Inject 2 Units into the skin 3 (three) times daily with meals.   JOINT SUPPORT PO Take 1-2 capsules by mouth daily.   loperamide 2 MG capsule Commonly known as:  IMODIUM Take 2 mg by mouth daily as needed for diarrhea or loose stools.   methocarbamol 500 MG tablet Commonly known as:  ROBAXIN Take 1 tablet (500 mg total) by mouth every 8 (eight) hours as needed for muscle spasms.   metoprolol 50 MG tablet Commonly known as:  LOPRESSOR Take 1 tablet (50 mg total) by mouth 2 (two) times daily.   mirabegron ER 25 MG Tb24 tablet Commonly known as:  MYRBETRIQ Take 25 mg by mouth daily.   MULTIVITAL PO Take 1 capsule by mouth daily.   nitroGLYCERIN 0.4 MG SL tablet Commonly known as:  NITROSTAT Place 0.4 mg under the tongue every 5 (five) minutes as needed for chest pain ((MAX 3 doses)). Reported on 06/05/2015   potassium chloride 10 MEQ tablet Commonly known as:  K-DUR Take 10 mEq by mouth daily.   Rivaroxaban 15 MG Tabs tablet Commonly known as:  XARELTO Take 1 tablet (15 mg total) by mouth daily with supper.   Vitamin D3 2000 units Tabs Take 1 tablet by mouth at bedtime.       Review of Systems  Constitutional: Negative for activity change, appetite change, chills, fatigue and fever.  HENT:  Negative for congestion, rhinorrhea, sinus pain, sinus pressure, sneezing and sore throat.   Eyes: Negative.   Respiratory: Negative for cough, chest tightness, shortness of breath and wheezing.   Cardiovascular: Negative for chest pain, palpitations and leg swelling.  Gastrointestinal: Negative for abdominal distention, abdominal pain, constipation, diarrhea, nausea and vomiting.  Genitourinary: Negative for difficulty urinating, flank pain, frequency and urgency.  Musculoskeletal: Positive for gait problem.       Lower back pain current  pain regimen ineffective.   Skin: Negative for color change, pallor and rash.  Neurological: Negative for dizziness, seizures, syncope, numbness and headaches.  Psychiatric/Behavioral: Negative for agitation, confusion, hallucinations and sleep disturbance. The patient is not nervous/anxious.     There is no immunization history for the selected administration types on file for this patient. Pertinent  Health Maintenance Due  Topic Date Due  . FOOT EXAM  12/03/1943  . OPHTHALMOLOGY EXAM  12/03/1943  . DEXA SCAN  12/03/2000  . PNA vac Low Risk Adult (1 of 2 - PCV13) 12/03/2000  . INFLUENZA VACCINE  11/04/2015  . HEMOGLOBIN A1C  08/26/2016  . URINE MICROALBUMIN  10/20/2016      Vitals:   03/03/16 1200  BP: (!) 154/84  Pulse: 92  Resp: 18  Temp: 98.8 F (37.1 C)  SpO2: 98%  Weight: 141 lb (64 kg)  Height: 5\' 4"  (1.626 m)   Body mass index is 24.2 kg/m. Physical Exam  Constitutional: She appears well-developed and well-nourished. No distress.  Grimaces and groaning due to pain    HENT:  Head: Normocephalic.  Mouth/Throat: Oropharynx is clear and moist. No oropharyngeal exudate.  Eyes: Conjunctivae and EOM are normal. Pupils are equal, round, and reactive to light. Right eye exhibits no discharge. Left eye exhibits no discharge. No scleral icterus.  Neck: Normal range of motion. No JVD present. No thyromegaly present.  Cardiovascular:  Normal rate, regular rhythm, normal heart sounds and intact distal pulses.  Exam reveals no gallop and no friction rub.   No murmur heard. Pulmonary/Chest: Effort normal and breath sounds normal. No respiratory distress. She has no wheezes. She has no rales.  Abdominal: Soft. Bowel sounds are normal. She exhibits no distension. There is no tenderness. There is no rebound and no guarding.  Musculoskeletal: She exhibits no edema, tenderness or deformity.  Limited ROM due to lower back pain. Unsteady gait . Uses FWW   Lymphadenopathy:    She has no cervical adenopathy.  Neurological: She is alert.  Skin: Skin is warm and dry. No rash noted. No erythema. No pallor.  Psychiatric: She has a normal mood and affect.    Labs reviewed:  Recent Labs  04/28/15 0246  02/27/16 1048 02/28/16 0343 03/01/16 0818  NA  --   < > 138 137 135  K  --   < > 2.9* 4.9 4.5  CL  --   < > 101 106 105  CO2  --   < > 29 23 21*  GLUCOSE  --   < > 162* 178* 191*  BUN  --   < > 8 10 19   CREATININE  --   < > 0.86 0.81 0.99  CALCIUM  --   < > 9.2 9.1 9.2  MG 1.7  --   --  1.8  --   < > = values in this interval not displayed.  Recent Labs  02/26/16 2324 02/27/16 1048  AST 19 20  ALT 14 16  ALKPHOS 83 74  BILITOT 0.7 1.1  PROT 6.1* 5.9*  ALBUMIN 3.7 3.6    Recent Labs  02/26/16 2324 02/27/16 1048 02/28/16 0343 02/29/16 0136 03/01/16 0818  WBC 6.2 8.5 6.7 7.8 6.8  NEUTROABS 4.4 6.9  --   --   --   HGB 13.4 13.2 12.9 12.4 12.2  HCT 39.1 38.9 38.5 37.2 36.6  MCV 85.4 85.7 86.1 86.9 86.5  PLT 206 209 216 221 203   Lab Results  Component Value  Date   TSH 3.364 03/12/2015   Lab Results  Component Value Date   HGBA1C 9.3 (H) 02/27/2016   Lab Results  Component Value Date   CHOL 208 (H) 10/03/2012   HDL 67.00 10/03/2012   LDLDIRECT 124.9 10/03/2012   TRIG 83.0 10/03/2012   CHOLHDL 3 10/03/2012   Assessment/Plan  Bilateral low back pain without sciatica, unspecified chronicity Current  pain regimen ineffective. Change Tramadol 50 mg Tablet one by mouth every 8 hours PRN. Add Hydrocodone 5/325 mg Tablet one by mouth every 4 HRS PRN for breakthrough pain. Will discontinue previous robaxin orders then start  Robaxin 500 mg Tablet to one by mouth daily at bedtime per patient's son request.    Family/ staff Communication: Reviewed plan of care with patient, patient's Son and facility Nurse/Nurse supervisor.   Labs/tests ordered:  None

## 2016-03-04 ENCOUNTER — Encounter: Payer: Self-pay | Admitting: Internal Medicine

## 2016-03-04 ENCOUNTER — Non-Acute Institutional Stay (SKILLED_NURSING_FACILITY): Payer: Medicare Other | Admitting: Internal Medicine

## 2016-03-04 DIAGNOSIS — R2681 Unsteadiness on feet: Secondary | ICD-10-CM

## 2016-03-04 DIAGNOSIS — N3281 Overactive bladder: Secondary | ICD-10-CM

## 2016-03-04 DIAGNOSIS — S32010S Wedge compression fracture of first lumbar vertebra, sequela: Secondary | ICD-10-CM

## 2016-03-04 DIAGNOSIS — I482 Chronic atrial fibrillation, unspecified: Secondary | ICD-10-CM

## 2016-03-04 DIAGNOSIS — N390 Urinary tract infection, site not specified: Secondary | ICD-10-CM | POA: Diagnosis not present

## 2016-03-04 DIAGNOSIS — F0390 Unspecified dementia without behavioral disturbance: Secondary | ICD-10-CM

## 2016-03-04 DIAGNOSIS — E440 Moderate protein-calorie malnutrition: Secondary | ICD-10-CM | POA: Diagnosis not present

## 2016-03-04 DIAGNOSIS — F329 Major depressive disorder, single episode, unspecified: Secondary | ICD-10-CM

## 2016-03-04 DIAGNOSIS — I2510 Atherosclerotic heart disease of native coronary artery without angina pectoris: Secondary | ICD-10-CM

## 2016-03-04 DIAGNOSIS — R531 Weakness: Secondary | ICD-10-CM

## 2016-03-04 DIAGNOSIS — B9620 Unspecified Escherichia coli [E. coli] as the cause of diseases classified elsewhere: Secondary | ICD-10-CM | POA: Diagnosis not present

## 2016-03-04 NOTE — Progress Notes (Signed)
LOCATION: [facility]  PCP: Tivis Ringer, MD   Code Status: Full Code  Goals of care: Advanced Directive information Advanced Directives 02/26/2016  Does Patient Have a Medical Advance Directive? No;Yes  Type of Advance Directive Living will  Would patient like information on creating a medical advance directive? -       Extended Emergency Contact Information Primary Emergency Contact: Fukuhara,James Address: [place], [place] Phone: [phone] Relation: Spouse Secondary Emergency Contact: Menken,Michael Address: [address]          [place], [place] [address] [place] Litter of [place] Phone: [phone] Mobile Phone: [phone] Relation: Son   Allergies  Allergen Reactions  . Aspirin     Rash - only in high doses  . Dimetapp Cold-Allergy     Rash   . Ilosone [Erythromycin Estolate]     rash  . Morphine And Related     rash  . Penicillins     Rash Has patient had a PCN reaction causing immediate rash, facial/tongue/throat swelling, SOB or lightheadedness with hypotension: YES Has patient had a PCN reaction causing severe rash involving mucus membranes or skin necrosis:NO Has patient had a PCN reaction that required hospitalization NO Has patient had a PCN reaction occurring within the last 10 years:NO If all of the above answers are "NO", then may proceed with Cephalosporin use.  Marland Kitchen Restoril     rash  . Zolpidem Tartrate     rash    Chief Complaint  Patient presents with  . New Admit To SNF    New Admission Visit     HPI:  Patient is a 82 y.o. female seen today for short term rehabilitation post hospital admission from 02/26/16-03/01/16 with low back pain post fall. She was diagnosed with acute L1 lumbar fracture. Neurosurgery was consulted and TLSO brace was advised. She had afib with RVR and required cardizem. She had NSTEMI and cardiology was consulted. Medical management was recommended. She  has PMH of CAD s/p CABG, chronic afib, DM, dementia among others. She is seen in her room today with her son at bedside. She was noted to have increased urinary frequency with hematuria and urine was sent for analysis. Her pain has been poorly controlled per patient and son.   Review of Systems:  Constitutional: Negative for fever, chills, diaphoresis. has low energy level. HENT: Negative for headache, congestion, nasal discharge, sore throat, difficulty swallowing.   Eyes: Negative for double vision and discharge.  Respiratory: Negative for cough, shortness of breath and wheezing.   Cardiovascular: Negative for chest pain, palpitations, leg swelling.  Gastrointestinal: Negative for heartburn, nausea, vomiting, loss of appetite. Positive for some abdominal discomfort. Last bowel movement was yesterday. Genitourinary: Positive for increased urinary frequency.    Musculoskeletal: Negative for fall in the facility. Positive for back pain. she has had several falls at home this year.  Skin: Negative for itching, rash.  Neurological: Positive for occasional dizziness  Psychiatric/Behavioral: Negative for depression. She has memory loss.    Past Medical History:  Diagnosis Date  . Coronary artery disease   . Diabetes mellitus   . Diastolic dysfunction   . Dyslipidemia   . Edema   . HX: long term anticoagulant use   . MI, old 76   involving small LCX  . PAF (paroxysmal atrial fibrillation) ([facility])   . Pulmonary hypertension   . S/P CABG (coronary artery bypass  graft) 2000  . Skin cancer    nose, leg   Past Surgical History:  Procedure Laterality Date  . CARDIAC CATHETERIZATION  1995   dr Oran Rein  . CORONARY ARTERY BYPASS GRAFT  2000   LIMA to LAD; SVG to intermediate, SVG to PD and RCA per Dr. Servando Snare  . OTHER SURGICAL HISTORY  1999   historectomy  . TUBAL LIGATION  1961   Social History:   reports that she has never smoked. She has never used smokeless tobacco. She reports that  she drinks about 1.8 oz of alcohol per week . She reports that she does not use drugs.  Family History  Problem Relation Age of Onset  . Heart attack Mother   . Transient ischemic attack Father     Medications:   Medication List       Accurate as of 03/04/16 12:49 PM. Always use your most recent med list.          ALIGN 4 MG Caps Take 1 capsule by mouth daily.   ammonium lactate 12 % cream Commonly known as:  AMLACTIN Apply 1 g topically as needed for dry skin.   aspirin EC 81 MG tablet Take 81 mg by mouth at bedtime.   atorvastatin 80 MG tablet Commonly known as:  LIPITOR Take 1 tablet (80 mg total) by mouth daily.   CALTRATE 600 PLUS-VIT D PO Take 1 tablet by mouth daily.   citalopram 20 MG tablet Commonly known as:  CELEXA Take 1 tablet by mouth every evening.   donepezil 10 MG tablet Commonly known as:  ARICEPT Take 1 tablet (10 mg total) by mouth daily.   feeding supplement (GLUCERNA SHAKE) Liqd Take 237 mLs by mouth 2 (two) times daily between meals.   fexofenadine 60 MG tablet Commonly known as:  ALLEGRA Take 60 mg by mouth daily as needed for allergies or rhinitis.   HYDROcodone-acetaminophen 5-325 MG tablet Commonly known as:  NORCO/VICODIN Take 1 tablet by mouth every 4 (four) hours as needed for moderate pain.   insulin glargine 100 UNIT/ML injection Commonly known as:  LANTUS Inject 0.08 mLs (8 Units total) into the skin at bedtime.   insulin lispro 100 UNIT/ML injection Commonly known as:  HUMALOG Inject 2 Units into the skin 3 (three) times daily with meals. ONLY GIVE IF RESIDENT EATS 50% OR MORE OF MEAL   loperamide 2 MG capsule Commonly known as:  IMODIUM Take 2 mg by mouth daily as needed for diarrhea or loose stools.   methocarbamol 500 MG tablet Commonly known as:  ROBAXIN Take 1 tablet (500 mg total) by mouth at bedtime.   metoprolol 50 MG tablet Commonly known as:  LOPRESSOR Take 1 tablet (50 mg total) by mouth 2 (two) times  daily.   mirabegron ER 25 MG Tb24 tablet Commonly known as:  MYRBETRIQ Take 25 mg by mouth daily.   MULTIVITAL PO Take 1 capsule by mouth daily.   nitroGLYCERIN 0.4 MG SL tablet Commonly known as:  NITROSTAT Place 0.4 mg under the tongue every 5 (five) minutes as needed for chest pain ((MAX 3 doses)). Reported on 06/05/2015   potassium chloride 10 MEQ tablet Commonly known as:  K-DUR Take 10 mEq by mouth daily.   Rivaroxaban 15 MG Tabs tablet Commonly known as:  XARELTO Take 1 tablet (15 mg total) by mouth daily with supper.   traMADol 50 MG tablet Commonly known as:  ULTRAM Take 1 tablet (50 mg total) by mouth every 8 (  eight) hours.   Vitamin D3 2000 units Tabs Take 1 tablet by mouth at bedtime.       Immunizations: Immunization History  Administered Date(s) Administered  . PPD Test 03/01/2016     Physical Exam: Vitals:   03/04/16 1239  BP: (!) 144/69  Pulse: 78  Resp: 18  Temp: 98.8 F (37.1 C)  TempSrc: Oral  SpO2: 97%  Weight: 141 lb (64 kg)  Height: 5\' 4"  (1.626 m)   Body mass index is 24.2 kg/m.  General- elderly female, well built, in no acute distress Head- normocephalic, atraumatic Nose- no maxillary or frontal sinus tenderness, no nasal discharge Throat- moist mucus membrane  Eyes- PERRLA, EOMI, no pallor, no icterus, no discharge, normal conjunctiva, normal sclera Neck- no cervical lymphadenopathy Cardiovascular- normal s1,s2, no murmur, trace leg edema Respiratory- bilateral clear to auscultation, no wheeze, no rhonchi, no crackles, no use of accessory muscles Abdomen- bowel sounds present, soft, non tender, no guarding or rigidity, no CVA tenderness Musculoskeletal- able to move all 4 extremities, generalized weakness, TLSO brace in place \\Neurological - alert and oriented to person and place but not to time Skin- warm and dry Psychiatry- normal mood and affect    Labs reviewed: Basic Metabolic Panel:  Recent Labs  04/28/15 0246   02/27/16 1048 02/28/16 0343 03/01/16 0818  NA  --   < > 138 137 135  K  --   < > 2.9* 4.9 4.5  CL  --   < > 101 106 105  CO2  --   < > 29 23 21*  GLUCOSE  --   < > 162* 178* 191*  BUN  --   < > 8 10 19   CREATININE  --   < > 0.86 0.81 0.99  CALCIUM  --   < > 9.2 9.1 9.2  MG 1.7  --   --  1.8  --   < > = values in this interval not displayed. Liver Function Tests:  Recent Labs  02/26/16 2324 02/27/16 1048  AST 19 20  ALT 14 16  ALKPHOS 83 74  BILITOT 0.7 1.1  PROT 6.1* 5.9*  ALBUMIN 3.7 3.6   No results for input(s): LIPASE, AMYLASE in the last 8760 hours. No results for input(s): AMMONIA in the last 8760 hours. CBC:  Recent Labs  02/26/16 2324 02/27/16 1048 02/28/16 0343 02/29/16 0136 03/01/16 0818  WBC 6.2 8.5 6.7 7.8 6.8  NEUTROABS 4.4 6.9  --   --   --   HGB 13.4 13.2 12.9 12.4 12.2  HCT 39.1 38.9 38.5 37.2 36.6  MCV 85.4 85.7 86.1 86.9 86.5  PLT 206 209 216 221 203   Cardiac Enzymes:  Recent Labs  02/28/16 0947  02/29/16 0136 02/29/16 0819 03/01/16 0818  CKTOTAL 36*  --   --   --   --   CKMB 2.3  --   --   --   --   TROPONINI  --   < > 4.07* 2.44* 1.13*  < > = values in this interval not displayed. BNP: Invalid input(s): POCBNP CBG:  Recent Labs  03/01/16 0801 03/01/16 1230 03/01/16 1719  GLUCAP 189* 207* 161*    Radiological Exams: Ct Head Wo Contrast  Result Date: 02/19/2016 CLINICAL DATA:  Patient fell from bed this afternoon. Syncope. On Xarelto. EXAM: CT HEAD WITHOUT CONTRAST TECHNIQUE: Contiguous axial images were obtained from the base of the skull through the vertex without intravenous contrast. COMPARISON:  03/12/2015 FINDINGS: BRAIN: There  is stable mild sulcal and moderate-to-marked ventricular prominence consistent with superficial and central atrophy. No intraparenchymal hemorrhage, mass effect nor midline shift. There is a moderate degree of patchy periventricular and subcortical white matter hypodensities consistent with  chronic small vessel ischemic disease. No acute large vascular territory infarcts. No abnormal extra-axial fluid collections. Basal cisterns are patent. Small bilateral basal ganglial lacunes superior chronic. VASCULAR: Moderate calcific atherosclerosis of the carotid siphons. SKULL: No skull fracture. No significant scalp soft tissue swelling. SINUSES/ORBITS: The mastoid air-cells are clear. The included paranasal sinuses are well-aerated.The included ocular globes and orbital contents are non-suspicious. Well-circumscribed benign-appearing 7 mm rounded bone cyst along the periphery the right orbit. OTHER: None. IMPRESSION: Chronic moderate periventricular and subcortical white matter small vessel ischemic disease. Chronic bilateral small basal ganglial lacunar infarcts. No acute intracranial hemorrhage, midline shift or edema. No acute fracture. Electronically Signed   By: Ashley Royalty M.D.   On: 02/19/2016 23:35   Ct Lumbar Spine Wo Contrast  Result Date: 02/27/2016 CLINICAL DATA:  82 y/o  F; back pain after a fall. EXAM: CT LUMBAR SPINE WITHOUT CONTRAST TECHNIQUE: Multidetector CT imaging of the lumbar spine was performed without intravenous contrast administration. Multiplanar CT image reconstructions were also generated. COMPARISON:  CT abdomen and pelvis 07/09/2005 FINDINGS: Segmentation: 5 lumbar type vertebrae. Alignment: Normal. Vertebrae: Moderate L1 superior endplate compression deformity within acute appearance. There is retropulsion of the posterior margin and lateral margins of the superior endplate into the central, left subarticular, left foraminal, and left extra foraminal zones. Mild resultant bony canal stenosis. No other fracture is identified. Paraspinal and other soft tissues: Severe calcific atherosclerosis of the abdominal aorta. Gallstones. There is mild paraspinal edema at the level of the L1 vertebral body. No hematoma identified. Punctate densities within the right renal hilum  probably represent vascular calcifications, less likely nephrolithiasis. Disc levels: Lumbar spondylosis and facet arthropathy without high-grade bony canal stenosis or foraminal narrowing. IMPRESSION: Moderate L1 superior endplate compression deformity with acute appearance and retropulsion of the posterior aspect of the superior endplate into the spinal canal with mild resultant bony canal stenosis. MRI can better assess degree of neural impingement if clinically indicated. No other fracture identified. Electronically Signed   By: Kristine Garbe M.D.   On: 02/27/2016 00:31   Mr Lumbar Spine Wo Contrast  Result Date: 02/27/2016 CLINICAL DATA:  81 y/o  F; L1 compression deformity. EXAM: MRI LUMBAR SPINE WITHOUT CONTRAST TECHNIQUE: Multiplanar, multisequence MR imaging of the lumbar spine was performed. No intravenous contrast was administered. COMPARISON:  02/26/2016 lumbar CT. FINDINGS: Segmentation:  Standard. Alignment:  Physiologic. Vertebrae: Superior endplate deformity of L1 vertebral body with moderate loss of height and mild retropulsion of the posterior aspect of the superior endplate. There is edema within the vertebral body extending into the pedicles. No evidence for additional fracture, diskitis, or suspicious osseous lesion. Prominent S2 Tarlov cysts. Conus medullaris: Extends to the L1-2 level and appears normal. Paraspinal and other soft tissues: Left kidney lower pole T2 hyperintense structure measuring 18 mm probably representing a cyst. Disc levels: T12-L1: The retropulsed superior endplate of L1 effaces the anterior thecal sac. There is no cord impingement or significant canal stenosis. Neural foramen are patent. L1-2: No significant disc displacement, foraminal narrowing, or canal stenosis. L2-3: Minimal disc bulge without significant foraminal narrowing or canal stenosis. L3-4: Minimal disc bulge without significant foraminal narrowing or canal stenosis. L4-5: Small disc bulge  and mild facet/ligamentum flavum hypertrophy. Mild bilateral foraminal and lateral recess narrowing.  No significant canal stenosis. Trace facet effusions. L5-S1: Minimal disc bulge mild bilateral facet hypertrophy. No significant foraminal narrowing or canal stenosis. IMPRESSION: L1 superior end plate acute moderate compression deformity. Mild retropulsion of the superior endplate effaces the anterior thecal sac of the spinal canal at that level without significant canal stenosis. There is no evidence for neural impingement. Electronically Signed   By: Kristine Garbe M.D.   On: 02/27/2016 05:08   Dg Lumbar Spine 1vclearing  Result Date: 02/27/2016 CLINICAL DATA:  L1 fracture. EXAM: LIMITED LUMBAR SPINE FOR TRAUMA CLEARING - 1 VIEW COMPARISON:  Radiographs and MRI of same day. FINDINGS: Moderate compression deformity of L1 vertebral body is noted consistent with fracture. Atherosclerosis of thoracic aorta is noted. Mild diffuse osteopenia is noted. No spondylolisthesis is noted. Remaining vertebral bodies appear intact. Disc spaces are well-maintained. IMPRESSION: Aortic atherosclerosis. Moderate compression deformity of L1 vertebral body consistent with fracture. Electronically Signed   By: Marijo Conception, M.D.   On: 02/27/2016 18:01   Dg Lumbar Spine 1vclearing  Result Date: 02/27/2016 CLINICAL DATA:  Known L1 compression deformity EXAM: LIMITED LUMBAR SPINE FOR TRAUMA CLEARING - 1 VIEW COMPARISON:  CT from 02/26/2016 and MRI from 02/27/2016 FINDINGS: L1 compression deformity is noted similar to that seen on prior CT and MRI. Slight increase kyphosis is noted at L1. Remainder of the lumbar vertebra show normal vertebral height. Mild anterolisthesis of L4 on L5 is noted. Facet hypertrophic changes are seen. Diffuse aortic calcifications are noted. IMPRESSION: L1 compression deformity similar to that noted on prior CT and MR. Degenerative changes as described. Electronically Signed   By: Inez Catalina M.D.   On: 02/27/2016 11:33   03/02/16 urineanalysis with urine culture: e.coli > 100,000  Assessment/Plan  Generalized weakness Will have patient work with PT/OT as tolerated to regain strength and restore function.  Fall precautions are in place.  Unsteady gait With frequent falls. Will have her work with physical therapy and occupational therapy team to help with gait training and muscle strengthening exercises.fall precautions. Skin care. Encourage to be out of bed.   Lumbar fracture Acute, has TLSO brace. To make neurosurgery follow up. PMR consult. Start her on tylenol extra strength 500 mg tid. Discontinue tramadol and continue nroco 5-325 1 tab q4h prn. Will have patient work with PT/OT as tolerated to regain strength and restore function.  Fall precautions are in place. Continue calcium and vitamin d. Continue robaxin at bedtime for now.   E.coli UTI Start nitrofurantoin 100 mg q12h x 5 days with florastor 250 mg bid x 1 week. Encourage hydration.   afib Rate contorlled. Continue lopressor 50 mg bid and rivaroxaban  CAD Recent NSTEMI. Continue b blocker. Chest pain free. Continue statin and baby aspirin. Continue prn NTG.   OAB Continue ehr myrbetriq and monitor  Protein calorie malnutrition Monitor po intake and weight. Continue feeding supplement  Chronic depression Continue citalopram 20 mg daily  Dementia Provide supportive care. Continue donepezil  Dm type 2 Lab Results  Component Value Date   HGBA1C 9.3 (H) 02/27/2016   Monitor cbg, continue lantus 8 u qhs and humalog SSI. Monitor for hypoglycemia.    Goals of care: short term rehabilitation   Labs/tests ordered: cbc, cmp 03/08/16  Family/ staff Communication: reviewed care plan with patient and nursing supervisor    Blanchie Serve, MD Internal Medicine [facility] [place], [place] [address] Cell Phone (Monday-Friday 8 am - 5 pm):  [phone] On Call: [phone] and follow prompts after 5 pm and on weekends Office Phone: [phone] Office Fax: [phone]

## 2016-03-08 LAB — BASIC METABOLIC PANEL
BUN: 14 mg/dL (ref 4–21)
Creatinine: 0.9 mg/dL (ref 0.5–1.1)
GLUCOSE: 109 mg/dL
Potassium: 4.4 mmol/L (ref 3.4–5.3)
SODIUM: 140 mmol/L (ref 137–147)

## 2016-03-08 LAB — CBC AND DIFFERENTIAL
HEMATOCRIT: 36 % (ref 36–46)
HEMOGLOBIN: 11.9 g/dL — AB (ref 12.0–16.0)
Platelets: 263 10*3/uL (ref 150–399)
WBC: 5.8 10^3/mL

## 2016-03-08 LAB — HEPATIC FUNCTION PANEL
ALK PHOS: 113 U/L (ref 25–125)
ALT: 18 U/L (ref 7–35)
AST: 16 U/L (ref 13–35)
Bilirubin, Total: 0.7 mg/dL

## 2016-03-15 ENCOUNTER — Other Ambulatory Visit: Payer: Self-pay | Admitting: *Deleted

## 2016-03-15 MED ORDER — HYDROCODONE-ACETAMINOPHEN 5-325 MG PO TABS: ORAL_TABLET | ORAL | 0 refills | Status: DC

## 2016-03-15 NOTE — Telephone Encounter (Signed)
[facility] [phone] Fax: [phone]  

## 2016-03-17 ENCOUNTER — Ambulatory Visit: Payer: Medicare Other | Admitting: Physician Assistant

## 2016-03-17 ENCOUNTER — Non-Acute Institutional Stay (SKILLED_NURSING_FACILITY): Payer: Medicare Other | Admitting: Family

## 2016-03-17 ENCOUNTER — Encounter: Payer: Self-pay | Admitting: Family

## 2016-03-17 DIAGNOSIS — I2510 Atherosclerotic heart disease of native coronary artery without angina pectoris: Secondary | ICD-10-CM | POA: Diagnosis not present

## 2016-03-17 DIAGNOSIS — I482 Chronic atrial fibrillation, unspecified: Secondary | ICD-10-CM

## 2016-03-17 DIAGNOSIS — E782 Mixed hyperlipidemia: Secondary | ICD-10-CM | POA: Diagnosis not present

## 2016-03-17 DIAGNOSIS — R2681 Unsteadiness on feet: Secondary | ICD-10-CM

## 2016-03-17 DIAGNOSIS — I10 Essential (primary) hypertension: Secondary | ICD-10-CM

## 2016-03-17 DIAGNOSIS — E119 Type 2 diabetes mellitus without complications: Secondary | ICD-10-CM | POA: Diagnosis not present

## 2016-03-17 DIAGNOSIS — S32010D Wedge compression fracture of first lumbar vertebra, subsequent encounter for fracture with routine healing: Secondary | ICD-10-CM | POA: Diagnosis not present

## 2016-03-17 NOTE — Progress Notes (Signed)
Patient ID: LAFRANCE SCHWICKERATH, female   DOB: [birth year], 82 y.o.   MRN: [id number]  Location:  [facility] Room Number: [id number] of Service:  SNF (31)  Provider: Marlowe Sax FNP-C   PCP: Tivis Ringer, MD Patient Care Team: Prince Solian, MD as PCP - General (Internal Medicine)  Extended Emergency Contact Information Primary Emergency Contact: Yutzy,James Address: [id number]          [place] Phone: [phone] Relation: Spouse Secondary Emergency Contact: Susan,Michael Address: [address]          [place], [place] [address] [place] Phone: [phone] Mobile Phone: [phone] Relation: Son  Code Status:Full Code  Goals of care:  Advanced Directive information Advanced Directives 03/17/2016  Does Patient Have a Medical Advance Directive? No  Type of Advance Directive -  Would patient like information on creating a medical advance directive? -     Allergies  Allergen Reactions  . Aspirin     Rash - only in high doses  . Dimetapp Cold-Allergy     Rash   . Ilosone [Erythromycin Estolate]     rash  . Morphine And Related     rash  . Penicillins     Rash Has patient had a PCN reaction causing immediate rash, facial/tongue/throat swelling, SOB or lightheadedness with hypotension: YES Has patient had a PCN reaction causing severe rash involving mucus membranes or skin necrosis:NO Has patient had a PCN reaction that required hospitalization NO Has patient had a PCN reaction occurring within the last 10 years:NO If all of the above answers are "NO", then may proceed with Cephalosporin use.  Marland Kitchen Restoril     rash  . Zolpidem Tartrate     rash    Chief Complaint  Patient presents with  . Discharge Note    HPI:  82 y.o. female  Seen today at [facility] for discharge home. She was here for short term rehabilitation post hospital admission from  02/26/16-03/01/16 with low back pain post fall. She was diagnosed with acute L1 lumbar fracture. Neurosurgery was consulted and TLSO brace was advised. She had AFib with RVR and was treated with cardizem. She had NSTEMI and cardiology was consulted. Medical management was recommended.She has  A medical history of HTN, CAD,Afib, post CABG, Type 2 DM, hyperlipidemia among other conditions. She is seen in her room today with Husband and son at bedside. During her stay here in rehab she was seen by PMR for pain management. Her lower back pain is now under control with current medication. She has worked well with PT/OT now stable for discharge home.She will be discharged home with Home health PT/OT to continue with ROM, Exercise, Gait stability and muscle strengthening. She does not require any DME patient son states has FWW and Rollator at home.She will require [facility] RN for her medication management.She will also need a HH Aid to assist with ADL's. Home health services will be arranged by facility social worker prior to discharge. Prescription medication will be written x 1 month then patient to follow up with PCP in 1-2 weeks.Facility staff report no new concerns.      Past Medical History:  Diagnosis Date  . Coronary artery disease   . Diabetes mellitus   . Diastolic dysfunction   . Dyslipidemia   . Edema   . HX: long term anticoagulant use   . 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 PAF (paroxysmal atrial fibrillation) ([facility])   . Pulmonary hypertension   . S/P CABG (coronary artery bypass graft) 2000  . Skin cancer    nose, leg    Past Surgical History:  Procedure Laterality Date  . CARDIAC CATHETERIZATION  1995   dr Oran Rein  . CORONARY ARTERY BYPASS GRAFT  2000   LIMA to LAD; SVG to intermediate, SVG to PD and RCA per Dr. Servando Snare  . OTHER SURGICAL HISTORY  1999   historectomy  . TUBAL LIGATION  1961      reports that she has never smoked. She has never used smokeless tobacco. She reports  that she drinks about 1.8 oz of alcohol per week . She reports that she does not use drugs. Social History   Social History  . Marital status: Married    Spouse name: N/A  . Number of children: 3  . Years of education: N/A   Occupational History  . Retired    Social History Main Topics  . Smoking status: Never Smoker  . Smokeless tobacco: Never Used  . Alcohol use 1.8 oz/week    3 Glasses of wine per week  . Drug use: No  . Sexual activity: Not on file   Other Topics Concern  . Not on file   Social History Narrative  . No narrative on file      Allergies  Allergen Reactions  . Aspirin     Rash - only in high doses  . Dimetapp Cold-Allergy     Rash   . Ilosone [Erythromycin Estolate]     rash  . Morphine And Related     rash  . Penicillins     Rash Has patient had a PCN reaction causing immediate rash, facial/tongue/throat swelling, SOB or lightheadedness with hypotension: YES Has patient had a PCN reaction causing severe rash involving mucus membranes or skin necrosis:NO Has patient had a PCN reaction that required hospitalization NO Has patient had a PCN reaction occurring within the last 10 years:NO If all of the above answers are "NO", then may proceed with Cephalosporin use.  Marland Kitchen Restoril     rash  . Zolpidem Tartrate     rash    Pertinent  Health Maintenance Due  Topic Date Due  . FOOT EXAM  12/03/1943  . OPHTHALMOLOGY EXAM  12/03/1943  . DEXA SCAN  12/03/2000  . PNA vac Low Risk Adult (1 of 2 - PCV13) 12/03/2000  . INFLUENZA VACCINE  11/04/2015  . HEMOGLOBIN A1C  08/26/2016  . URINE MICROALBUMIN  10/20/2016    Medications:   Medication List       Accurate as of 03/17/16  5:36 PM. Always use your most recent med list.          ALIGN 4 MG Caps Take 1 capsule by mouth daily.   ammonium lactate 12 % cream Commonly known as:  AMLACTIN Apply 1 g topically as needed for dry skin.   aspirin EC 81 MG tablet Take 81 mg by mouth at bedtime.     atorvastatin 80 MG tablet Commonly known as:  LIPITOR Take 1 tablet (80 mg total) by mouth daily.   CALTRATE 600 PLUS-VIT D PO Take 1 tablet by mouth daily.   citalopram 20 MG tablet Commonly known as:  CELEXA Take 1 tablet by mouth every evening.   donepezil 10 MG tablet Commonly known as:  ARICEPT Take 1 tablet (10 mg total) by mouth daily.   fexofenadine  60 MG tablet Commonly known as:  ALLEGRA Take 60 mg by mouth daily as needed for allergies or rhinitis.   HYDROcodone-acetaminophen 5-325 MG tablet Commonly known as:  NORCO/VICODIN Take 1 tablet by mouth every 6 (six) hours as needed for severe pain.   insulin glargine 100 UNIT/ML injection Commonly known as:  LANTUS Inject 0.08 mLs (8 Units total) into the skin at bedtime.   insulin lispro 100 UNIT/ML injection Commonly known as:  HUMALOG Inject 2 Units into the skin 3 (three) times daily with meals. ONLY GIVE IF RESIDENT EATS 50% OR MORE OF MEAL   lidocaine 5 % Commonly known as:  LIDODERM Place 1 patch onto the skin daily. Apply to back, or most painful site, Remove & Discard patch within 12 hours or as directed by MD   loperamide 2 MG capsule Commonly known as:  IMODIUM Take 2 mg by mouth daily as needed for diarrhea or loose stools.   methocarbamol 500 MG tablet Commonly known as:  ROBAXIN Take 1 tablet (500 mg total) by mouth at bedtime.   metoprolol 50 MG tablet Commonly known as:  LOPRESSOR Take 1 tablet (50 mg total) by mouth 2 (two) times daily.   mirabegron ER 25 MG Tb24 tablet Commonly known as:  MYRBETRIQ Take 25 mg by mouth daily.   MULTIVITAL PO Take 1 capsule by mouth daily.   nitroGLYCERIN 0.4 MG SL tablet Commonly known as:  NITROSTAT Place 0.4 mg under the tongue every 5 (five) minutes as needed for chest pain ((MAX 3 doses)). Reported on 06/05/2015   ondansetron 4 MG tablet Commonly known as:  ZOFRAN Take 4 mg by mouth every 6 (six) hours as needed for nausea.   potassium chloride  10 MEQ tablet Commonly known as:  K-DUR Take 10 mEq by mouth daily.   Rivaroxaban 15 MG Tabs tablet Commonly known as:  XARELTO Take 1 tablet (15 mg total) by mouth daily with supper.   traMADol 50 MG tablet Commonly known as:  ULTRAM Take 50 mg by mouth every 4 (four) hours as needed for moderate pain or severe pain.   Vitamin D3 2000 units Tabs Take 1 tablet by mouth at bedtime.       Review of Systems  Constitutional: Negative for activity change, appetite change, chills, fatigue and fever.  HENT: Negative for congestion, rhinorrhea, sinus pain, sinus pressure, sneezing and sore throat.   Eyes: Negative.   Respiratory: Negative for cough, chest tightness, shortness of breath and wheezing.   Cardiovascular: Negative for chest pain, palpitations and leg swelling.  Gastrointestinal: Negative for abdominal distention, abdominal pain, constipation, diarrhea, nausea and vomiting.  Endocrine: Negative for polydipsia, polyphagia and polyuria.  Genitourinary: Negative for difficulty urinating, flank pain, frequency and urgency.  Musculoskeletal: Positive for gait problem.       Lower back pain current pain regimen effective.   Skin: Negative for color change, pallor and rash.  Neurological: Negative for dizziness, seizures, syncope, numbness and headaches.  Hematological: Does not bruise/bleed easily.  Psychiatric/Behavioral: Negative for agitation, confusion, hallucinations and sleep disturbance. The patient is not nervous/anxious.     Vitals:   03/17/16 1347  BP: 124/73  Pulse: 76  Resp: 18  Temp: 97.9 F (36.6 C)  TempSrc: Oral  SpO2: 98%  Weight: 141 lb 3.2 oz (64 kg)  Height: 5\' 4"  (1.626 m)   Body mass index is 24.24 kg/m. Physical Exam  Constitutional: She appears well-developed and well-nourished. No distress.  HENT:  Head: Normocephalic.  Mouth/Throat: Oropharynx is clear and moist. No oropharyngeal exudate.  Eyes: Conjunctivae and EOM are normal. Pupils are  equal, round, and reactive to light. Right eye exhibits no discharge. Left eye exhibits no discharge. No scleral icterus.  Neck: Normal range of motion. No JVD present. No thyromegaly present.  Cardiovascular: Normal rate, regular rhythm, normal heart sounds and intact distal pulses.  Exam reveals no gallop and no friction rub.   No murmur heard. Pulmonary/Chest: Effort normal and breath sounds normal. No respiratory distress. She has no wheezes. She has no rales.  Abdominal: Soft. Bowel sounds are normal. She exhibits no distension. There is no tenderness. There is no rebound and no guarding.  Musculoskeletal: She exhibits no edema, tenderness or deformity.   Unsteady gait . Uses FWW   Lymphadenopathy:    She has no cervical adenopathy.  Neurological: She is alert.  Skin: Skin is warm and dry. No rash noted. No erythema. No pallor.  Psychiatric: She has a normal mood and affect.    Labs reviewed: Basic Metabolic Panel:  Recent Labs  04/28/15 0246  02/27/16 1048 02/28/16 0343 03/01/16 0818 03/08/16 0117  NA  --   < > 138 137 135 140  K  --   < > 2.9* 4.9 4.5 4.4  CL  --   < > 101 106 105  --   CO2  --   < > 29 23 21*  --   GLUCOSE  --   < > 162* 178* 191*  --   BUN  --   < > 8 10 19 14   CREATININE  --   < > 0.86 0.81 0.99 0.9  CALCIUM  --   < > 9.2 9.1 9.2  --   MG 1.7  --   --  1.8  --   --   < > = values in this interval not displayed. Liver Function Tests:  Recent Labs  02/26/16 2324 02/27/16 1048 03/08/16 0117  AST 19 20 16   ALT 14 16 18   ALKPHOS 83 74 113  BILITOT 0.7 1.1  --   PROT 6.1* 5.9*  --   ALBUMIN 3.7 3.6  --    CBC:  Recent Labs  02/26/16 2324 02/27/16 1048 02/28/16 0343 02/29/16 0136 03/01/16 0818 03/08/16 0117  WBC 6.2 8.5 6.7 7.8 6.8 5.8  NEUTROABS 4.4 6.9  --   --   --   --   HGB 13.4 13.2 12.9 12.4 12.2 11.9*  HCT 39.1 38.9 38.5 37.2 36.6 36  MCV 85.4 85.7 86.1 86.9 86.5  --   PLT 206 209 216 221 203 263   Cardiac  Enzymes:  Recent Labs  02/28/16 0947  02/29/16 0136 02/29/16 0819 03/01/16 0818  CKTOTAL 36*  --   --   --   --   CKMB 2.3  --   --   --   --   TROPONINI  --   < > 4.07* 2.44* 1.13*  < > = values in this interval not displayed.   Recent Labs  03/01/16 0801 03/01/16 1230 03/01/16 1719  GLUCAP 189* 207* 161*    Assessment/Plan:   Type 2 DM CBG stable. Continue on Lantus and Humalog.Hgb A1C with PCP.    HTN B/p stable. Continue on Metoprolol tartrate. BMP in 1-2 weeks with PCP   Afib  Continue on Xarelto.   CAD Chest pain free, continue on Xarelto and ASA EC.   Compression Fracture L1  Status post short term  rehabilitation post hospital admission from 02/26/16-03/01/16 with low back pain post fall. She was diagnosed with acute L1 lumbar fracture. Neurosurgery was consulted and TLSO brace was advised. Continue current pain regimen. Wean off Narcotics as tolerated.   Unsteady gait  has worked well with PT/OT now stable for discharge home.She will be discharged home with Home health PT/OT to continue with ROM, Exercise, Gait stability and muscle strengthening. She does not require any DME patient son states has FWW and Rollator at home.  Hyperlipidemia  Continue on Lipitor 80 mg Tablet. Lipid panel with PCP    Patient is being discharged with the following home health services:   - PT/OT to continue with ROM, Exercise, Gait stability and muscle strengthening.  - She will require [facility] RN for her medication management.   -[facility] to assist with ADL's    Patient is being discharged with the following durable medical equipment:    -She does not require any DME patient son states has FWW and Rollator at home.  Patient has been advised to f/u with their PCP in 1-2 weeks to for a transitions of care visit.  Social services at their facility was responsible for arranging this appointment.  Pt was provided with adequate prescriptions of noncontrolled medications to reach the  scheduled appointment .  For controlled substances, a limited supply was provided as appropriate for the individual patient.  If the pt normally receives these medications from a pain clinic or has a contract with another physician, these medications should be received from that clinic or physician only).    Future labs/tests needed: CBC, BMP in 1-2 weeks with PCP

## 2016-04-29 ENCOUNTER — Ambulatory Visit (INDEPENDENT_AMBULATORY_CARE_PROVIDER_SITE_OTHER): Payer: Medicare Other | Admitting: Ophthalmology

## 2016-05-14 ENCOUNTER — Ambulatory Visit (INDEPENDENT_AMBULATORY_CARE_PROVIDER_SITE_OTHER): Payer: Medicare Other | Admitting: Ophthalmology

## 2016-05-29 ENCOUNTER — Emergency Department (HOSPITAL_COMMUNITY): Payer: Medicare Other

## 2016-05-29 ENCOUNTER — Encounter (HOSPITAL_COMMUNITY): Payer: Self-pay | Admitting: Emergency Medicine

## 2016-05-29 ENCOUNTER — Emergency Department (HOSPITAL_COMMUNITY)
Admission: EM | Admit: 2016-05-29 | Discharge: 2016-05-29 | Disposition: A | Discharge status: Home / Self Care | Payer: Medicare Other | Attending: Emergency Medicine | Admitting: Emergency Medicine

## 2016-05-29 DIAGNOSIS — W010XXA Fall on same level from slipping, tripping and stumbling without subsequent striking against object, initial encounter: Secondary | ICD-10-CM | POA: Insufficient documentation

## 2016-05-29 DIAGNOSIS — Z79899 Other long term (current) drug therapy: Secondary | ICD-10-CM | POA: Diagnosis not present

## 2016-05-29 DIAGNOSIS — Z85828 Personal history of other malignant neoplasm of skin: Secondary | ICD-10-CM | POA: Diagnosis not present

## 2016-05-29 DIAGNOSIS — Y929 Unspecified place or not applicable: Secondary | ICD-10-CM | POA: Diagnosis not present

## 2016-05-29 DIAGNOSIS — S0990XA Unspecified injury of head, initial encounter: Secondary | ICD-10-CM | POA: Diagnosis not present

## 2016-05-29 DIAGNOSIS — S32010A Wedge compression fracture of first lumbar vertebra, initial encounter for closed fracture: Secondary | ICD-10-CM

## 2016-05-29 DIAGNOSIS — I2510 Atherosclerotic heart disease of native coronary artery without angina pectoris: Secondary | ICD-10-CM | POA: Diagnosis not present

## 2016-05-29 DIAGNOSIS — Y999 Unspecified external cause status: Secondary | ICD-10-CM | POA: Diagnosis not present

## 2016-05-29 DIAGNOSIS — W19XXXA Unspecified fall, initial encounter: Secondary | ICD-10-CM

## 2016-05-29 DIAGNOSIS — Z955 Presence of coronary angioplasty implant and graft: Secondary | ICD-10-CM | POA: Insufficient documentation

## 2016-05-29 DIAGNOSIS — Z794 Long term (current) use of insulin: Secondary | ICD-10-CM | POA: Insufficient documentation

## 2016-05-29 DIAGNOSIS — E119 Type 2 diabetes mellitus without complications: Secondary | ICD-10-CM | POA: Diagnosis not present

## 2016-05-29 DIAGNOSIS — Z7982 Long term (current) use of aspirin: Secondary | ICD-10-CM | POA: Insufficient documentation

## 2016-05-29 DIAGNOSIS — Y939 Activity, unspecified: Secondary | ICD-10-CM | POA: Diagnosis not present

## 2016-05-29 DIAGNOSIS — S22000A Wedge compression fracture of unspecified thoracic vertebra, initial encounter for closed fracture: Secondary | ICD-10-CM

## 2016-05-29 LAB — CBC
HEMATOCRIT: 34.6 % — AB (ref 36.0–46.0)
HEMOGLOBIN: 11 g/dL — AB (ref 12.0–15.0)
MCH: 28.4 pg (ref 26.0–34.0)
MCHC: 31.8 g/dL (ref 30.0–36.0)
MCV: 89.4 fL (ref 78.0–100.0)
Platelets: 176 10*3/uL (ref 150–400)
RBC: 3.87 MIL/uL (ref 3.87–5.11)
RDW: 16 % — ABNORMAL HIGH (ref 11.5–15.5)
WBC: 4.8 10*3/uL (ref 4.0–10.5)

## 2016-05-29 LAB — URINALYSIS, ROUTINE W REFLEX MICROSCOPIC
Bilirubin Urine: NEGATIVE
Glucose, UA: NEGATIVE mg/dL
Ketones, ur: NEGATIVE mg/dL
Nitrite: POSITIVE — AB
Protein, ur: 30 mg/dL — AB
SQUAMOUS EPITHELIAL / LPF: NONE SEEN
Specific Gravity, Urine: 1.013 (ref 1.005–1.030)
pH: 6 (ref 5.0–8.0)

## 2016-05-29 LAB — BASIC METABOLIC PANEL
ANION GAP: 9 (ref 5–15)
BUN: 10 mg/dL (ref 6–20)
CALCIUM: 9.3 mg/dL (ref 8.9–10.3)
CO2: 29 mmol/L (ref 22–32)
Chloride: 102 mmol/L (ref 101–111)
Creatinine, Ser: 0.8 mg/dL (ref 0.44–1.00)
GFR calc Af Amer: >60 mL/min (ref >60)
GFR calc non Af Amer: >60 mL/min (ref >60)
GLUCOSE: 125 mg/dL — AB (ref 65–99)
POTASSIUM: 3.9 mmol/L (ref 3.5–5.1)
Sodium: 140 mmol/L (ref 135–145)

## 2016-05-29 MED ORDER — CEPHALEXIN 500 MG PO CAPS: 500.0000 mg | ORAL_CAPSULE | Freq: Two times a day (BID) | ORAL | 0 refills | Status: AC

## 2016-05-29 MED ORDER — CEPHALEXIN 250 MG PO CAPS
500.0000 mg | ORAL_CAPSULE | Freq: Once | ORAL | Status: AC
  Administered 2016-05-29: 500 mg via ORAL
  Filled 2016-05-29: qty 2

## 2016-05-29 MED ORDER — ACETAMINOPHEN 500 MG PO TABS
1000.0000 mg | ORAL_TABLET | Freq: Once | ORAL | Status: AC
  Administered 2016-05-29: 1000 mg via ORAL
  Filled 2016-05-29: qty 2

## 2016-05-29 NOTE — ED Notes (Signed)
Patient transported to CT 

## 2016-05-29 NOTE — ED Notes (Signed)
Patient transported to X-ray 

## 2016-05-29 NOTE — ED Provider Notes (Signed)
TIME SEEN:  By signing my name below, I, Karen Arroyo, attest that this documentation has been prepared under the direction and in the presence of [name], DO.  Electronically Signed: Julien Arroyo, ED Scribe. 05/29/16. 4:07 AM.   CHIEF COMPLAINT:  Chief Complaint  Patient presents with  . Loss of Consciousness  . Fall  . Head Injury     HPI:  HPI Comments: Karen Arroyo is a 83 y.o. female who has a PMhx of a-fib on Xarelto, CAD, DMII, HTN, HLD, CABG, and NSTEMI presents to the Emergency Department complaining of unwitnessed fall that occurred PTA. Pt says that she tripped over her own feet and fell. Son states pt woke up this evening ~11:30 pm and heard her cry out.  Fall was unwitnessed. She had redness to her right face. Per son, pt had a hx of syncope and has fallen several times in the past several years due to autonomic instability, each with similar presentation of tonights episode. He notes she fell in November 2017 which resulted in a compression fracture in her lumbar vertebrae. Son reports pt Has looked uncomfortable sitting in the chair in the waiting room he is concerned that her back is hurting. When patient is asked if she is having pain anywhere she denies it.    ROS: See HPI Constitutional: no fever  Eyes: no drainage  ENT: no runny nose   Cardiovascular:  no chest pain  Resp: no SOB  GI: no vomiting GU: no dysuria Integumentary: no rash  Allergy: no hives  Musculoskeletal: no leg swelling  Neurological: no slurred speech ROS otherwise negative  PAST MEDICAL HISTORY/PAST SURGICAL HISTORY:  Past Medical History:  Diagnosis Date  . Coronary artery disease   . Diabetes mellitus   . Diastolic dysfunction   . Dyslipidemia   . Edema   . HX: long term anticoagulant use   . MI, old 69   involving small LCX  . PAF (paroxysmal atrial fibrillation) ([place])   . Pulmonary hypertension   . S/P CABG (coronary artery bypass graft) 2000  . Skin cancer    nose, leg    MEDICATIONS:  Prior to Admission medications   Medication Sig Start Date End Date Taking? Authorizing Provider  ammonium lactate (AMLACTIN) 12 % cream Apply 1 g topically as needed for dry skin.  11/01/11   Historical Provider, MD  aspirin EC 81 MG tablet Take 81 mg by mouth at bedtime.    Historical Provider, MD  atorvastatin (LIPITOR) 80 MG tablet Take 1 tablet (80 mg total) by mouth daily. 03/01/16   [name], MD  Calcium-Vitamin D (CALTRATE 600 PLUS-VIT D PO) Take 1 tablet by mouth daily.     Historical Provider, MD  Cholecalciferol (VITAMIN D3) 2000 units TABS Take 1 tablet by mouth at bedtime.    Historical Provider, MD  citalopram (CELEXA) 20 MG tablet Take 1 tablet by mouth every evening.  10/23/10   Historical Provider, MD  donepezil (ARICEPT) 10 MG tablet Take 1 tablet (10 mg total) by mouth daily. 06/05/15   Cameron Sprang, MD  fexofenadine (ALLEGRA) 60 MG tablet Take 60 mg by mouth daily as needed for allergies or rhinitis.     Historical Provider, MD  HYDROcodone-acetaminophen (NORCO/VICODIN) 5-325 MG tablet Take 1 tablet by mouth every 6 (six) hours as needed for severe pain.    Historical Provider, MD  insulin glargine (LANTUS) 100 UNIT/ML injection Inject 0.08 mLs (8 Units total) into the skin at  bedtime. 03/01/16   [name], MD  insulin lispro (HUMALOG) 100 UNIT/ML injection Inject 2 Units into the skin 3 (three) times daily with meals. ONLY GIVE IF RESIDENT EATS 50% OR MORE OF MEAL    Historical Provider, MD  lidocaine (LIDODERM) 5 % Place 1 patch onto the skin daily. Apply to back, or most painful site, Remove & Discard patch within 12 hours or as directed by MD    Historical Provider, MD  loperamide (IMODIUM) 2 MG capsule Take 2 mg by mouth daily as needed for diarrhea or loose stools.    Historical Provider, MD  methocarbamol (ROBAXIN) 500 MG tablet Take 1 tablet (500 mg total) by mouth at bedtime. 03/03/16   [name], NP  metoprolol  tartrate (LOPRESSOR) 50 MG tablet Take 1 tablet (50 mg total) by mouth 2 (two) times daily. 03/01/16   [name], MD  mirabegron ER (MYRBETRIQ) 25 MG TB24 tablet Take 25 mg by mouth daily.    Historical Provider, MD  Multiple Vitamins-Minerals (MULTIVITAL PO) Take 1 capsule by mouth daily.     Historical Provider, MD  nitroGLYCERIN (NITROSTAT) 0.4 MG SL tablet Place 0.4 mg under the tongue every 5 (five) minutes as needed for chest pain ((MAX 3 doses)). Reported on 06/05/2015    Historical Provider, MD  ondansetron (ZOFRAN) 4 MG tablet Take 4 mg by mouth every 6 (six) hours as needed for nausea.    Historical Provider, MD  potassium chloride (K-DUR) 10 MEQ tablet Take 10 mEq by mouth daily.    Historical Provider, MD  Probiotic Product (ALIGN) 4 MG CAPS Take 1 capsule by mouth daily.    Historical Provider, MD  Rivaroxaban (XARELTO) 15 MG TABS tablet Take 1 tablet (15 mg total) by mouth daily with supper. 04/28/15   Karen Eva, MD  traMADol (ULTRAM) 50 MG tablet Take 50 mg by mouth every 4 (four) hours as needed for moderate pain or severe pain.    Historical Provider, MD    ALLERGIES:  Allergies  Allergen Reactions  . Aspirin     Rash - only in high doses  . Dimetapp Cold-Allergy     Rash   . Ilosone [Erythromycin Estolate]     rash  . Morphine And Related     rash  . Penicillins     Rash Has patient had a PCN reaction causing immediate rash, facial/tongue/throat swelling, SOB or lightheadedness with hypotension: YES Has patient had a PCN reaction causing severe rash involving mucus membranes or skin necrosis:NO Has patient had a PCN reaction that required hospitalization NO Has patient had a PCN reaction occurring within the last 10 years:NO If all of the above answers are "NO", then may proceed with Cephalosporin use.  Marland Kitchen Restoril     rash  . Zolpidem Tartrate     rash    SOCIAL HISTORY:  Social History  Substance Use Topics  . Smoking status: Never Smoker  . Smokeless  tobacco: Never Used  . Alcohol use 1.8 oz/week    3 Glasses of wine per week    FAMILY HISTORY: Family History  Problem Relation Age of Onset  . Heart attack Mother   . Transient ischemic attack Father     EXAM: BP 179/91 (BP Location: Left Arm)   Pulse 78   Temp 98.3 F (36.8 C) (Oral)   Resp 16   SpO2 95%  CONSTITUTIONAL: Alert and oriented 3 and responds appropriately to questions. Well-appearing; well-nourished; GCS 63, elderly  and in no distress HEAD: Normocephalic; some mild erythema without warmth or tenderness of the right side of the face, no bony tenderness or deformity over the face EYES: Conjunctivae clear, PERRL, EOMI ENT: normal nose; no rhinorrhea; moist mucous membranes; pharynx without lesions noted; no dental injury; no septal hematoma NECK: Supple, no meningismus, no LAD; no midline spinal tenderness, step-off or deformity; trachea midline CARD: RRR; S1 and S2 appreciated; no murmurs, no clicks, no rubs, no gallops RESP: Normal chest excursion without splinting or tachypnea; breath sounds clear and equal bilaterally; no wheezes, no rhonchi, no rales; no hypoxia or respiratory distress CHEST:  chest wall stable, no crepitus or ecchymosis or deformity, nontender to palpation; no flail chest ABD/GI: Normal bowel sounds; non-distended; soft, non-tender, no rebound, no guarding; no ecchymosis or other lesions noted PELVIS:  stable, nontender to palpation BACK:  The back appears normal and is minimally tender over the mid thoracic region, there is no CVA tenderness; no midline spinal tenderness, step-off or deformity EXT: Normal ROM in all joints; non-tender to palpation; no edema; normal capillary refill; no cyanosis, no bony tenderness or bony deformity of patient's extremities, no joint effusion, compartments are soft, extremities are warm and well-perfused, no ecchymosis or lacerations    SKIN: Normal color for age and race; warm NEURO: Moves all extremities equally,  sensation to light touch intact diffusely, cranial nerves II through XII intact PSYCH: The patient's mood and manner are appropriate. Grooming and personal hygiene are appropriate.  MEDICAL DECISION MAKING: Patient here after fall. She reports she thinks that she tripped over her feet. Central reports history of orthostatic hypotension, automatic instability and he also thinks this could've caused her symptoms. Denies chest pain or shortness of breath. EKG shows no new ischemic abnormality. Labs ordered in triage are unremarkable. She does appear to have a urinary tract infection. We'll send urine culture and start patient on Keflex. No previous cultures in our system. CT of her head shows no acute abnormality. Patient's son is requesting imaging of her spine as he states that she has appeared to be uncomfortable while sitting in the chair in the waiting room. She currently denies any pain but does have some mild midthoracic tenderness on exam. Will give dose of Tylenol here. Patient and son would like discharge home if workup is unremarkable. She is at her neurologic baseline and is hemodynamically stable here.  ED PROGRESS: Patient's x-ray show progression of midthoracic vertebral compression fracture with anterior wedging. No other acute fracture. This is slightly worsened since January 2017. Lumbar x-ray shows mild interval progression of previously seen L1 compression fracture with anterior wedging. No retropulsion seen. She is neurologically intact. Son reports that she does not do well with opiate pain medication and that Tylenol has been controlling her pain well. She denies any pain currently and is smiling, laughing. He states that she has been told she is not a candidate for surgery or kyphoplasty. He reports she has done well with reclast over 10 years ago and this was a conversation he was going to happen with the primary care physician when her compression fractures were "healed". She states she  is comfortable with plan for discharge home as is her son. He states that they have had [facility] help [facility] with [facility] needs but often patient and her husband refuse any home health help and also refused placement. Son often stays with patient and husband to help them at home.  He states that they will follow closely  with the primary care physician. We'll discharge with Keflex for her urinary tract infection. Culture is pending. Discussed return precautions. They're comfortable with this plan.  Have offered social work and case management consult which they have declined. Have also discussed possibility of admission but son does not feel this is necessary as he denies that she has any significant acute change.   At this time, I do not feel there is any life-threatening condition present. I have reviewed and discussed all results (EKG, imaging, lab, urine as appropriate) and exam findings with patient/family. I have reviewed nursing notes and appropriate previous records.  I feel the patient is safe to be discharged home without further emergent workup and can continue workup as an outpatient as needed. Discussed usual and customary return precautions. Patient/family verbalize understanding and are comfortable with this plan.  Outpatient follow-up has been provided. All questions have been answered.      EKG Interpretation  Date/Time:  Saturday May 29 2016 01:41:23 EST Ventricular Rate:  78 PR Interval:    QRS Duration: 102 QT Interval:  408 QTC Calculation: 465 R Axis:   133 Text Interpretation:  Atrial fibrillation with a competing junctional pacemaker Right axis deviation Incomplete right bundle branch block Possible Right ventricular hypertrophy Septal infarct , age undetermined Abnormal ECG Confirmed by Karen Kinnard,  DO, Anneli Bing ([id number]) on 05/29/2016 3:53:30 AM        I personally performed the services described in this documentation, which was scribed in my presence. The recorded  information has been reviewed and is accurate.    [place], DO 05/29/16 [phone]

## 2016-05-29 NOTE — ED Triage Notes (Signed)
Pt presents to ED for assessment after having an unwitnessed fall/possible syncope.  Pt has redness to right eye and face.  Pt denying pain at this time.  Patient's son very educated on patient's history.  Patient has hx of a-fib.  Alert at this time.  Hx of some memory impairment, but son states after fall patient more confused than normal.  Patient unhappy to be here and argumentative with RN.  Not wanting blood work or EKG at this time.

## 2016-05-29 NOTE — Discharge Instructions (Signed)
Please take Tylenol 1000 mg every 6 hours as needed for pain.

## 2016-05-31 LAB — URINE CULTURE: Culture: >=100000 — AB

## 2016-06-01 ENCOUNTER — Telehealth: Payer: Self-pay | Admitting: Emergency Medicine

## 2016-06-01 NOTE — Telephone Encounter (Signed)
Post ED Visit - Positive Culture Follow-up  Culture report reviewed by antimicrobial stewardship pharmacist:  [x]  Elenor Quinones, Pharm.D. []  Heide Guile, Pharm.D., BCPS []  Parks Neptune, Pharm.D. []  Alycia Rossetti, Pharm.D., BCPS []  [place], Pharm.D., BCPS, AAHIVP []  Legrand Como, Pharm.D., BCPS, AAHIVP []  Cassie Stewart, Pharm.D. []  Stephens November, Pharm.D.  Positive urine culture Treated with cephalexin, organism sensitive to the same and no further patient follow-up is required at this time.  Hazle Nordmann 06/01/2016, 11:15 AM

## 2016-06-03 ENCOUNTER — Ambulatory Visit (INDEPENDENT_AMBULATORY_CARE_PROVIDER_SITE_OTHER): Payer: Medicare Other | Admitting: Internal Medicine

## 2016-06-03 ENCOUNTER — Encounter: Payer: Self-pay | Admitting: Internal Medicine

## 2016-06-03 VITALS — BP 171/95 | HR 87 | Ht 64.0 in | Wt 140.0 lb

## 2016-06-03 DIAGNOSIS — E785 Hyperlipidemia, unspecified: Secondary | ICD-10-CM

## 2016-06-03 DIAGNOSIS — Z951 Presence of aortocoronary bypass graft: Secondary | ICD-10-CM | POA: Diagnosis not present

## 2016-06-03 DIAGNOSIS — I480 Paroxysmal atrial fibrillation: Secondary | ICD-10-CM | POA: Diagnosis not present

## 2016-06-03 NOTE — Patient Instructions (Signed)
Your physician wants you to follow-up in: 6 months with Dr. Hilty. You will receive a reminder letter in the mail two months in advance. If you don't receive a letter, please call our office to schedule the follow-up appointment.    

## 2016-06-03 NOTE — Progress Notes (Signed)
OFFICE NOTE  Chief Complaint:  Routine follow-up.  Primary Care Physician: Tivis Ringer, MD  HPI:  Karen Arroyo is a 83 y.o. female who was formerly followed by Dr. Mare Ferrari and is now a patient of mine after his retirement. As a history of remote coronary artery bypass grafting in 2000 as well as a history of prior MI in 1995 involving a small circumflex vessel. She also has PAF, type 2 diabetes, diastolic dysfunction, dyslipidemia and is on attack coagulation with Xarelto. Overall she seems to be doing quite well. Her blood pressure is well-controlled today. She is on Lasix however recently her dose was reduced to 20 mg daily. She maintained A. fib with a heart rate of 80 and is noted to have PVCs on her EKG today. She denies any chest pain or worsening shortness of breath. Does have chronic lower extremity edema as well as varicose veins. There is also significant memory loss.  06/03/2016  Karen Arroyo returns for follow-up. Unfortunately she had a vertebral compression fracture in the fall. She was in the hospital for several days related to that and had some breakthrough atrial fibrillation. She appears to be an irregular rhythm with good rate control today. She was seen by Dr. Caryl Comes in consultation in November. She is on Xarelto. Recently she had a UTI and is on antibiotics for that. She also had a fall which was described as tripping out of bed. She does not follow regularly but we talked about the importance of reducing falls as far as reducing her risk of bleeding associated with being on blood thinners.  PMHx:  Past Medical History:  Diagnosis Date  . Coronary artery disease   . Diabetes mellitus   . Diastolic dysfunction   . Dyslipidemia   . Edema   . HX: long term anticoagulant use   . MI, old 37   involving small LCX  . PAF (paroxysmal atrial fibrillation) ([place])   . Pulmonary hypertension   . S/P CABG (coronary artery bypass graft) 2000  . Skin cancer    nose, leg      Past Surgical History:  Procedure Laterality Date  . CARDIAC CATHETERIZATION  1995   dr Oran Rein  . CORONARY ARTERY BYPASS GRAFT  2000   LIMA to LAD; SVG to intermediate, SVG to PD and RCA per Dr. Servando Snare  . OTHER SURGICAL HISTORY  1999   historectomy  . TUBAL LIGATION  1961    FAMHx:  Family History  Problem Relation Age of Onset  . Heart attack Mother   . Transient ischemic attack Father     SOCHx:   reports that she has never smoked. She has never used smokeless tobacco. She reports that she drinks about 1.8 oz of alcohol per week . She reports that she does not use drugs.  ALLERGIES:  Allergies  Allergen Reactions  . Aspirin     Rash - only in high doses  . Dimetapp Cold-Allergy     Rash   . Ilosone [Erythromycin Estolate]     rash  . Morphine And Related     rash  . Penicillins     Rash Has patient had a PCN reaction causing immediate rash, facial/tongue/throat swelling, SOB or lightheadedness with hypotension: YES Has patient had a PCN reaction causing severe rash involving mucus membranes or skin necrosis:NO Has patient had a PCN reaction that required hospitalization NO Has patient had a PCN reaction occurring within the last 10 years:NO If all of  the above answers are "NO", then may proceed with Cephalosporin use.  Marland Kitchen Restoril     rash  . Zolpidem Tartrate     rash    ROS: Pertinent items are noted in HPI. Pertinent items noted in HPI and remainder of comprehensive ROS otherwise negative.  HOME MEDS: Current Outpatient Prescriptions on File Prior to Visit  Medication Sig Dispense Refill  . ammonium lactate (AMLACTIN) 12 % cream Apply 1 g topically as needed for dry skin.     Marland Kitchen aspirin EC 81 MG tablet Take 81 mg by mouth at bedtime.    Marland Kitchen atorvastatin (LIPITOR) 80 MG tablet Take 1 tablet (80 mg total) by mouth daily.    . Calcium-Vitamin D (CALTRATE 600 PLUS-VIT D PO) Take 1 tablet by mouth daily.     . cephALEXin (KEFLEX) 500 MG capsule Take 1  capsule (500 mg total) by mouth 2 (two) times daily. 14 capsule 0  . Cholecalciferol (VITAMIN D3) 2000 units TABS Take 1 tablet by mouth at bedtime.    . citalopram (CELEXA) 20 MG tablet Take 1 tablet by mouth every evening.     . donepezil (ARICEPT) 10 MG tablet Take 1 tablet (10 mg total) by mouth daily. 90 tablet 3  . fexofenadine (ALLEGRA) 60 MG tablet Take 60 mg by mouth daily as needed for allergies or rhinitis.     Marland Kitchen HYDROcodone-acetaminophen (NORCO/VICODIN) 5-325 MG tablet Take 1 tablet by mouth every 6 (six) hours as needed for severe pain.    Marland Kitchen loperamide (IMODIUM) 2 MG capsule Take 2 mg by mouth daily as needed for diarrhea or loose stools.    . metoprolol tartrate (LOPRESSOR) 50 MG tablet Take 1 tablet (50 mg total) by mouth 2 (two) times daily.    . mirabegron ER (MYRBETRIQ) 25 MG TB24 tablet Take 25 mg by mouth daily.    . Multiple Vitamins-Minerals (MULTIVITAL PO) Take 1 capsule by mouth daily.     . nitroGLYCERIN (NITROSTAT) 0.4 MG SL tablet Place 0.4 mg under the tongue every 5 (five) minutes as needed for chest pain ((MAX 3 doses)). Reported on 06/05/2015    . ondansetron (ZOFRAN) 4 MG tablet Take 4 mg by mouth every 6 (six) hours as needed for nausea.    . potassium chloride (K-DUR) 10 MEQ tablet Take 10 mEq by mouth daily.    . Probiotic Product (ALIGN) 4 MG CAPS Take 1 capsule by mouth daily.    . Rivaroxaban (XARELTO) 15 MG TABS tablet Take 1 tablet (15 mg total) by mouth daily with supper. 30 tablet 0  . traMADol (ULTRAM) 50 MG tablet Take 50 mg by mouth every 4 (four) hours as needed for moderate pain or severe pain.     No current facility-administered medications on file prior to visit.     LABS/IMAGING: No results found for this or any previous visit (from the past 48 hour(s)). No results found.  WEIGHTS: Wt Readings from Last 3 Encounters:  06/03/16 140 lb (63.5 kg)  03/17/16 141 lb 3.2 oz (64 kg)  03/04/16 141 lb (64 kg)    VITALS: BP (!) 171/95 (BP  Location: Left Arm, Patient Position: Sitting, Cuff Size: Normal)   Pulse 87   Ht 5\' 4"  (1.626 m)   Wt 140 lb (63.5 kg)   BMI 24.03 kg/m   EXAM: General appearance: alert and no distress Neck: no carotid bruit and no JVD Lungs: clear to auscultation bilaterally Heart: regular rate and rhythm, S1, S2 normal, no  murmur, click, rub or gallop Abdomen: soft, non-tender; bowel sounds normal; no masses,  no organomegaly Extremities: extremities normal, atraumatic, no cyanosis or edema Pulses: 2+ and symmetric Skin: Skin color, texture, turgor normal. No rashes or lesions Neurologic: Grossly normal Psych: Pleasant  EKG: Deferred  ASSESSMENT: 1. Coronary artery disease status post CABG in 2000 2. History of circumflex territory MI 6 3. PAF on Xarelto - CHADSVASC 5 4. DM2 5. HTN 6. HPL  PLAN: 1.   Mrs. Reviere seems to be doing well without recurrent angina. She's had some PAF, most recently associated with hospitalization for compression fracture. She was rate controlled and remains on Xarelto. She also had a recent fall getting out of bed too quickly. She is to be careful about falls as it could put her at increased risk of bleeding on Xarelto. We'll continue current medications. Blood pressure initially was elevated today however came down 134/90. Continue current medications we'll plan to see her back in 6 months or sooner as necessary.  Pixie Casino, MD, [facility] Attending Cardiologist [place] C Elspeth Blucher 06/03/2016, 4:36 PM

## 2016-06-04 ENCOUNTER — Ambulatory Visit: Payer: Medicare Other | Admitting: Neurology

## 2016-06-07 ENCOUNTER — Encounter: Payer: Self-pay | Admitting: Neurology

## 2016-06-08 ENCOUNTER — Ambulatory Visit (INDEPENDENT_AMBULATORY_CARE_PROVIDER_SITE_OTHER): Payer: Medicare Other | Admitting: Neurology

## 2016-06-08 VITALS — BP 134/70 | HR 77 | Ht 64.0 in | Wt 141.0 lb

## 2016-06-08 DIAGNOSIS — F0390 Unspecified dementia without behavioral disturbance: Secondary | ICD-10-CM | POA: Diagnosis not present

## 2016-06-08 DIAGNOSIS — F03B0 Unspecified dementia, moderate, without behavioral disturbance, psychotic disturbance, mood disturbance, and anxiety: Secondary | ICD-10-CM

## 2016-06-08 MED ORDER — MEMANTINE HCL 10 MG PO TABS: ORAL_TABLET | ORAL | 3 refills | Status: AC

## 2016-06-08 NOTE — Patient Instructions (Signed)
1. Start Namenda 10mg : take 1 tablet at night for 2 weeks, then increase to 1 tablet twice a day 2. Continue Donepezil 10mg  daily 3. Physical exercise and brain stimulation exercises are important for brain health 4. Start looking into more options for higher level of care  5. Follow-up in 1 year, call for any changes

## 2016-06-08 NOTE — Progress Notes (Signed)
NEUROLOGY FOLLOW UP OFFICE NOTE  Karen Arroyo WZ:[id number]  HISTORY OF PRESENT ILLNESS: I had the pleasure of seeing Karen Arroyo in follow-up in the neurology clinic on 06/08/2016.  The patient was last seen a year ago for mild cognitive impairment. MMSE in March 2017 was 24/30. She is again accompanied by her husband today.  She reports her memory is "sometimes good, sometimes not much." Her husband feels she is doing okay. Her husband is in charge of bills and manages her medications. She can dress and bathe independently. She fell last March 18, 2023 and fractured L1 vertebra, was in atrial fibrillation. She was in rehab until mid-December. They have had help in their house with PT and OT until mid-March. She had another fall on 2/24 due to orthostatic hypotension when she got up quickly when her son arrived. She was also treated for a UTI then had diarrhea from her antibiotic. She is taking Aricept without side effects. Her husband feels it has not slowed down any progression. She forgets where the bathroom is, he has to give her directions to the bathroom or which way the kitchen is in their house of 40 years. No personality changes or hallucinations. She denies any headaches, dizziness, focal numbness/tingling/weakness.   HPI 06/05/2015: This is a pleasant 82 yo RH woman with a history of hypertension, hyperlipidemia, diabetes, CAD s/p CABG, atrial fibrillation on anticoagulation, who was admitted to [facility] last 04/26/15 for syncope. Reason for consult listed is for syncope, however on review of records and patient/husband report, syncopal episodes in January appear to have been related to orthostatic hypotension, BP medications were adjusted, and she denies any further similar symptoms since then. On further review of records, it appears she is here today for memory loss noted in her cardiologist office last December 2016. Per office notes, she was alone and was asking medicine for her memory. History was pretty  unclear at that time, she was only able to tell her NP that she had passed out in 03-18-2023 but does not recall much. She was continuously asking about her Aricept. Her last several INRs were very subtherapeutic, she admitted she probably misses some of her medications.   She reports her memory is "not what it used to be." Her husband stated noticing changes around 2-3 years ago. She states she "forgets everything." Her husband reports that she frequently says "I don't remember that." She misplaces things frequently. Her husband fills her pillbox every week, and does notice that she would forget a dose maybe once a week. She does not drive due to macular degeneration. Her husband has always been in charge of bill payments. He has been cooking for them for the past 10 years, they deny leaving the stove on or burning anything. They have a cleaning lady who comes once a month, per husband hygiene is good, no difficulties with dressing/bathing independently. She had been taking Aricept, but her husband states he has not seen the Aricept bottle recently when he fills her pillbox, she may have forgotten to renew it.   She has macular degeneration in both eyes, right>left. She reports occasional left hand tremors. Otherwise she denies any headaches, dizziness, diplopia, dysarthria,dysphagia, neck/back pain, bowel/bladder dysfunction, anosmia. No family history of dementia. She denies any history of head injuries. She drinks a glass of wine nearly every afternoon.   I personally reviewed head CT without contrast done 03/12/15 which did not show any acute changes. There was mild diffuse atrophy with ventricular dilatation, chronic  microvascular disease.  PAST MEDICAL HISTORY: Past Medical History:  Diagnosis Date  . Coronary artery disease   . Diabetes mellitus   . Diastolic dysfunction   . Dyslipidemia   . Edema   . HX: long term anticoagulant use   . 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 PAF (paroxysmal  atrial fibrillation) ([place])   . Pulmonary hypertension   . S/P CABG (coronary artery bypass graft) 2000  . Skin cancer    nose, leg    MEDICATIONS: Current Outpatient Prescriptions on File Prior to Visit  Medication Sig Dispense Refill  . ammonium lactate (AMLACTIN) 12 % cream Apply 1 g topically as needed for dry skin.     Marland Kitchen aspirin EC 81 MG tablet Take 81 mg by mouth at bedtime.    Marland Kitchen atorvastatin (LIPITOR) 80 MG tablet Take 1 tablet (80 mg total) by mouth daily.    . Calcium-Vitamin D (CALTRATE 600 PLUS-VIT D PO) Take 1 tablet by mouth daily.     . cephALEXin (KEFLEX) 500 MG capsule Take 1 capsule (500 mg total) by mouth 2 (two) times daily. 14 capsule 0  . Cholecalciferol (VITAMIN D3) 2000 units TABS Take 1 tablet by mouth at bedtime.    . citalopram (CELEXA) 20 MG tablet Take 1 tablet by mouth every evening.     . donepezil (ARICEPT) 10 MG tablet Take 1 tablet (10 mg total) by mouth daily. 90 tablet 3  . fexofenadine (ALLEGRA) 60 MG tablet Take 60 mg by mouth daily as needed for allergies or rhinitis.     Marland Kitchen HYDROcodone-acetaminophen (NORCO/VICODIN) 5-325 MG tablet Take 1 tablet by mouth every 6 (six) hours as needed for severe pain.    Marland Kitchen loperamide (IMODIUM) 2 MG capsule Take 2 mg by mouth daily as needed for diarrhea or loose stools.    . metoprolol tartrate (LOPRESSOR) 50 MG tablet Take 1 tablet (50 mg total) by mouth 2 (two) times daily.    . mirabegron ER (MYRBETRIQ) 25 MG TB24 tablet Take 25 mg by mouth daily.    . Multiple Vitamins-Minerals (MULTIVITAL PO) Take 1 capsule by mouth daily.     . Multiple Vitamins-Minerals (PRESERVISION AREDS 2 PO) Take 1 tablet by mouth 2 (two) times daily.    Marland Kitchen MYRBETRIQ 50 MG TB24 tablet Take 1 tablet by mouth at bedtime.    . nitroGLYCERIN (NITROSTAT) 0.4 MG SL tablet Place 0.4 mg under the tongue every 5 (five) minutes as needed for chest pain ((MAX 3 doses)). Reported on 06/05/2015    . ondansetron (ZOFRAN) 4 MG tablet Take 4 mg by mouth every 6  (six) hours as needed for nausea.    . potassium chloride (K-DUR) 10 MEQ tablet Take 10 mEq by mouth daily.    . Probiotic Product (ALIGN) 4 MG CAPS Take 1 capsule by mouth daily.    . Rivaroxaban (XARELTO) 15 MG TABS tablet Take 1 tablet (15 mg total) by mouth daily with supper. 30 tablet 0  . traMADol (ULTRAM) 50 MG tablet Take 50 mg by mouth every 4 (four) hours as needed for moderate pain or severe pain.     No current facility-administered medications on file prior to visit.     ALLERGIES: Allergies  Allergen Reactions  . Aspirin     Rash - only in high doses  . Dimetapp Cold-Allergy     Rash   . Ilosone [Erythromycin Estolate]     rash  . Morphine And Related  rash  . Penicillins     Rash Has patient had a PCN reaction causing immediate rash, facial/tongue/throat swelling, SOB or lightheadedness with hypotension: YES Has patient had a PCN reaction causing severe rash involving mucus membranes or skin necrosis:NO Has patient had a PCN reaction that required hospitalization NO Has patient had a PCN reaction occurring within the last 10 years:NO If all of the above answers are "NO", then may proceed with Cephalosporin use.  Marland Kitchen Restoril     rash  . Zolpidem Tartrate     rash    FAMILY HISTORY: Family History  Problem Relation Age of Onset  . Heart attack Mother   . Transient ischemic attack Father     SOCIAL HISTORY: Social History   Social History  . Marital status: Married    Spouse name: N/A  . Number of children: 3  . Years of education: N/A   Occupational History  . Retired    Social History Main Topics  . Smoking status: Never Smoker  . Smokeless tobacco: Never Used  . Alcohol use 1.8 oz/week    3 Glasses of wine per week  . Drug use: No  . Sexual activity: Not on file   Other Topics Concern  . Not on file   Social History Narrative  . No narrative on file    REVIEW OF SYSTEMS: Constitutional: No fevers, chills, or sweats, no generalized  fatigue, change in appetite Eyes: No visual changes, double vision, eye pain Ear, nose and throat: No hearing loss, ear pain, nasal congestion, sore throat Cardiovascular: No chest pain, palpitations Respiratory:  No shortness of breath at rest or with exertion, wheezes GastrointestinaI: No nausea, vomiting, diarrhea, abdominal pain, fecal incontinence Genitourinary:  No dysuria, urinary retention or frequency Musculoskeletal:  No neck pain, back pain Integumentary: No rash, pruritus, skin lesions Neurological: as above Psychiatric: No depression, insomnia, anxiety Endocrine: No palpitations, fatigue, diaphoresis, mood swings, change in appetite, change in weight, increased thirst Hematologic/Lymphatic:  No anemia, purpura, petechiae. Allergic/Immunologic: no itchy/runny eyes, nasal congestion, recent allergic reactions, rashes  PHYSICAL EXAM: Vitals:   06/08/16 1542  BP: 134/70  Pulse: 77   General: No acute distress Head:  Normocephalic/atraumatic Neck: supple, no paraspinal tenderness, full range of motion Heart:  Regular rate and rhythm Lungs:  Clear to auscultation bilaterally Back: No paraspinal tenderness Skin/Extremities: No rash, no edema Neurological Exam: alert and oriented to person, place, and season. She states it is 2/13, Wednesday (it is 3/6, Tuesday). No aphasia or dysarthria. Fund of knowledge is appropriate.  Recent and remote memory are impaired.  Attention and concentration are reduced, unable to spell WORLD backward.   Able to name objects and repeat phrases.  MMSE - Mini Mental State Exam 06/08/2016 06/05/2015  Orientation to time 1 2  Orientation to Place 4 5  Registration 3 3  Attention/ Calculation 0 5  Recall 0 0  Language- name 2 objects 2 2  Language- repeat 1 1  Language- follow 3 step command 3 3  Language- read & follow direction 1 1  Write a sentence 1 1  Copy design 0 1  Total score 16 24   Cranial nerves: Pupils equal, round, reactive to  light.  Extraocular movements intact with no nystagmus. Visual fields full. Facial sensation intact. No facial asymmetry. Tongue, uvula, palate midline.  Motor: Bulk and tone normal, muscle strength 5/5 throughout with no pronator drift.  Sensation to light touch intact.  No extinction to double simultaneous stimulation.  Deep tendon reflexes +1 throughout, toes downgoing.  Finger to nose testing intact.  Gait slow and cautious, no ataxia.  Romberg negative.  IMPRESSION: This is a pleasant 83 yo RH woman with a history of hypertension, hyperlipidemia, diabetes, CAD, atrial fibrillation on anticoagulation, with mild cognitive impairment. Her neurological exam is overall unremarkable, MMSE today is 16/30, showing a decline from a year ago (24/30 in March 2017), indicating moderate dementia. She is taking Aricept 10mg  daily, Namenda will be added. Side effects were discussed. I discussed home safety and starting to look into option for higher level of care. We again discussed the importance of control of vascular risk factors, physical exercise, and brain stimulation exercises for brain health. She will follow-up in 1 year and knows to call for any changes.  Thank you for allowing me to participate in her care.  Please do not hesitate to call for any questions or concerns.  The duration of this appointment visit was 25 minutes of face-to-face time with the patient.  Greater than 50% of this time was spent in counseling, explanation of diagnosis, planning of further management, and coordination of care.   Ellouise Newer, M.D.   CC: Dr. Dagmar Hait

## 2016-06-15 ENCOUNTER — Encounter: Payer: Self-pay | Admitting: Neurology

## 2016-06-15 DIAGNOSIS — F03B0 Unspecified dementia, moderate, without behavioral disturbance, psychotic disturbance, mood disturbance, and anxiety: Secondary | ICD-10-CM | POA: Insufficient documentation

## 2016-06-15 DIAGNOSIS — F0390 Unspecified dementia without behavioral disturbance: Secondary | ICD-10-CM | POA: Insufficient documentation

## 2017-05-31 ENCOUNTER — Ambulatory Visit: Admit: 2017-05-31 | Discharge: 2017-05-31 | Payer: MEDICARE | Attending: Audiologist | Primary: Family Medicine

## 2017-05-31 ENCOUNTER — Ambulatory Visit: Attending: Audiologist

## 2017-05-31 DIAGNOSIS — H903 Sensorineural hearing loss, bilateral: Secondary | ICD-10-CM

## 2017-05-31 NOTE — Progress Notes (Signed)
AUDIOLOGICAL EVALUATION      HISTORY:    Patient was referred to this office for decreased hearing.  The patient denies tinnitus, ear pain and dizziness at this time.    EVALUATION:    Otoscopy was unremarkable. Tympanometry revealed Type A tymps indicative of normal middle ear function at today's visit. Audiological results are consistent with a moderate to profound sensorineural hearing loss at both ears. Word discrimination scores are good at both ears.      RECOMMENDATIONS:    Today's results were discussed with the patient. Due to today's results, I recommend bilateral hearing aids in order to optimize communication ability. I will be glad to schedule a hearing aid evaluation at our office at any time. Thank you for allowing me to participate in the care of this patient. Please let me know if I can be of further assistance.        Sincerely,      Zara CouncilNancy P Kathy Wahid, AUD  Doctor of Audiology

## 2017-06-07 ENCOUNTER — Encounter: Attending: Audiologist | Primary: Family Medicine

## 2017-06-08 ENCOUNTER — Ambulatory Visit: Payer: Medicare Other | Admitting: Neurology

## 2017-09-16 ENCOUNTER — Emergency Department: Admit: 2017-09-16 | Payer: MEDICARE | Primary: Family Medicine

## 2017-09-16 ENCOUNTER — Inpatient Hospital Stay
Admit: 2017-09-16 | Discharge: 2017-09-16 | Disposition: A | Discharge status: Home / Self Care | Payer: MEDICARE | Attending: Emergency Medicine

## 2017-09-16 DIAGNOSIS — M25512 Pain in left shoulder: Secondary | ICD-10-CM

## 2017-09-16 NOTE — ED Notes (Signed)
 I have reviewed discharge instructions with the patient and her husband.  The patient and her husband verbalized understanding.    Patient left ED via Discharge Method: wheelchair to Home with her husband at bedside.    Opportunity for questions and clarification provided.       Patient given 0 scripts.         To continue your aftercare when you leave the hospital, you may receive an automated call from our care team to check in on how you are doing.  This is a free service and part of our promise to provide the best care and service to meet your aftercare needs." If you have questions, or wish to unsubscribe from this service please call [phone].  Thank you for Choosing our [facility] Emergency Department.

## 2017-09-16 NOTE — ED Provider Notes (Signed)
Husband states that the patient fell 8 days ago.  [facility] to the [facility] and had x-rays done.  She sustained a contusion to her scalp, he states.  Her CT scan was fine.  She went home and later on started complaining of pain to her left shoulder.  He lives in a nursing home, a mobile x-ray was done showing no fracture.  He is concerned about her persistent pain, thus she was brought here for evaluation.    Elements of this note were created using speech recognition software.  As such, errors of speech recognition may be present.           Past Medical History:   Diagnosis Date   ??? CAD (coronary artery disease)    ??? Dementia    ??? Heart failure (HCC)    ??? Hypertension        No past surgical history on file.      No family history on file.    Social History     Socioeconomic History   ??? Marital status: SINGLE     Spouse name: Not on file   ??? Number of children: Not on file   ??? Years of education: Not on file   ??? Highest education level: Not on file   Occupational History   ??? Not on file   Social Needs   ??? Financial resource strain: Not on file   ??? Food insecurity:     Worry: Not on file     Inability: Not on file   ??? Transportation needs:     Medical: Not on file     Non-medical: Not on file   Tobacco Use   ??? Smoking status: Not on file   Substance and Sexual Activity   ??? Alcohol use: Not on file   ??? Drug use: Not on file   ??? Sexual activity: Not on file   Lifestyle   ??? Physical activity:     Days per week: Not on file     Minutes per session: Not on file   ??? Stress: Not on file   Relationships   ??? Social connections:     Talks on phone: Not on file     Gets together: Not on file     Attends religious service: Not on file     Active member of club or organization: Not on file     Attends meetings of clubs or organizations: Not on file     Relationship status: Not on file   ??? Intimate partner violence:     Fear of current or ex partner: Not on file     Emotionally abused: Not on file     Physically abused:  Not on file     Forced sexual activity: Not on file   Other Topics Concern   ??? Not on file   Social History Narrative   ??? Not on file         ALLERGIES: Aspirin; Dimetapp [brompheniramine-pseudoephedrin]; Erythromycin; Morphine; Pcn [penicillins]; Pollen extracts; and Temazepam    Review of Systems   Constitutional: Negative for chills and fever.   Gastrointestinal: Negative for nausea and vomiting.   All other systems reviewed and are negative.      Vitals:    09/16/17 1253   BP: 185/84   Pulse: 74   Resp: 16   Temp: 98.2 ??F (36.8 ??C)   SpO2: 96%   Weight: 68 kg (150 lb)  Height: 5\' 5"  (1.651 m)            Physical Exam   Constitutional: She appears well-developed and well-nourished.   HENT:   Head: Normocephalic.   Nose: Nose normal.   Eyes: Pupils are equal, round, and reactive to light. Conjunctivae are normal.   Neck: Normal range of motion. Neck supple.   Musculoskeletal: She exhibits no edema, tenderness or deformity.   No tenderness to palpation or deformity or swelling to left shoulder.  She is neurovascular intact in her left hand, 2+ radial pulse.   Neurological: She is alert.   Skin: Skin is warm and dry.   Psychiatric: She has a normal mood and affect. Her behavior is normal.   Nursing note and vitals reviewed.       MDM  Number of Diagnoses or Management Options  Fall, initial encounter: new and does not require workup  Pain in joint of left shoulder: new and does not require workup  Diagnosis management comments: 2:52 PM discussed results with patient and husband, unremarkable x-rays.       Amount and/or Complexity of Data Reviewed  Tests in the radiology section of CPT??: ordered and reviewed    Risk of Complications, Morbidity, and/or Mortality  Presenting problems: moderate  Diagnostic procedures: moderate  Management options: moderate    Patient Progress  Patient progress: stable         Procedures

## 2017-09-16 NOTE — ED Notes (Signed)
Pt in [facility] [place] c/o left shoulder pain after fall 4 days ago. Pt was seen [facility] at time of fall but did not complain of shoulder pain. Staff told ems pt c/o pain with movement of shoulder. Pt denies pain on triage. Pt has history of dementia at baseline per ems.

## 2017-09-16 NOTE — Progress Notes (Signed)
Visited with patient and husband.  Patient does not engage in conversation at all.  Husband states they live at [facility] in [place].  States patient is an assisted living patient and he is her 'companion'.  States she is wheelchair bound and requires assistance with all activities of daily living. Chart screened by case manager for discharge planning.  No needs identified at this time.  Please consult case manager if any new issues arise.

## 2017-09-16 NOTE — ED Notes (Signed)
I have reviewed discharge instructions with the patient and her husband.  The patient and her husband verbalized understanding.    Patient left ED via Discharge Method: wheelchair to Home with her husband at bedside.    Opportunity for questions and clarification provided.       Patient given 0 scripts.         To continue your aftercare when you leave the hospital, you may receive an automated call from our care team to check in on how you are doing.  This is a free service and part of our promise to provide the best care and service to meet your aftercare needs.??? If you have questions, or wish to unsubscribe from this service please call [phone].  Thank you for Choosing our [place] [facility] Department.

## 2017-09-16 NOTE — ED Provider Notes (Signed)
Husband states that the patient fell 8 days ago.  She went to the [facility] and had x-rays done.  She sustained a contusion to her scalp, he states.  Her CT scan was fine.  She went home and later on started complaining of pain to her left shoulder.  He lives in a nursing home, a mobile x-ray was done showing no fracture.  He is concerned about her persistent pain, thus she was brought here for evaluation.    Elements of this note were created using speech recognition software.  As such, errors of speech recognition may be present.           Past Medical History:   Diagnosis Date   ??? CAD (coronary artery disease)    ??? Dementia    ??? Heart failure (HCC)    ??? Hypertension        No past surgical history on file.      No family history on file.    Social History     Socioeconomic History   ??? Marital status: SINGLE     Spouse name: Not on file   ??? Number of children: Not on file   ??? Years of education: Not on file   ??? Highest education level: Not on file   Occupational History   ??? Not on file   Social Needs   ??? Financial resource strain: Not on file   ??? Food insecurity:     Worry: Not on file     Inability: Not on file   ??? Transportation needs:     Medical: Not on file     Non-medical: Not on file   Tobacco Use   ??? Smoking status: Not on file   Substance and Sexual Activity   ??? Alcohol use: Not on file   ??? Drug use: Not on file   ??? Sexual activity: Not on file   Lifestyle   ??? Physical activity:     Days per week: Not on file     Minutes per session: Not on file   ??? Stress: Not on file   Relationships   ??? Social connections:     Talks on phone: Not on file     Gets together: Not on file     Attends religious service: Not on file     Active member of club or organization: Not on file     Attends meetings of clubs or organizations: Not on file     Relationship status: Not on file   ??? Intimate partner violence:     Fear of current or ex partner: Not on file     Emotionally abused: Not on file      Physically abused: Not on file     Forced sexual activity: Not on file   Other Topics Concern   ??? Not on file   Social History Narrative   ??? Not on file         ALLERGIES: Aspirin; Dimetapp [brompheniramine-pseudoephedrin]; Erythromycin; Morphine; Pcn [penicillins]; Pollen extracts; and Temazepam    Review of Systems   Constitutional: Negative for chills and fever.   Gastrointestinal: Negative for nausea and vomiting.   All other systems reviewed and are negative.      Vitals:    09/16/17 1253   BP: 185/84   Pulse: 74   Resp: 16   Temp: 98.2 ??F (36.8 ??C)   SpO2: 96%   Weight: 68 kg (150 lb)  Height: 5\' 5"  (1.651 m)            Physical Exam   Constitutional: She appears well-developed and well-nourished.   HENT:   Head: Normocephalic.   Nose: Nose normal.   Eyes: Pupils are equal, round, and reactive to light. Conjunctivae are normal.   Neck: Normal range of motion. Neck supple.   Musculoskeletal: She exhibits no edema, tenderness or deformity.   No tenderness to palpation or deformity or swelling to left shoulder.  She is neurovascular intact in her left hand, 2+ radial pulse.   Neurological: She is alert.   Skin: Skin is warm and dry.   Psychiatric: She has a normal mood and affect. Her behavior is normal.   Nursing note and vitals reviewed.       MDM  Number of Diagnoses or Management Options  Fall, initial encounter: new and does not require workup  Pain in joint of left shoulder: new and does not require workup  Diagnosis management comments: 2:52 PM discussed results with patient and husband, unremarkable x-rays.       Amount and/or Complexity of Data Reviewed  Tests in the radiology section of CPT??: ordered and reviewed    Risk of Complications, Morbidity, and/or Mortality  Presenting problems: moderate  Diagnostic procedures: moderate  Management options: moderate    Patient Progress  Patient progress: stable         Procedures

## 2017-09-16 NOTE — ED Triage Notes (Signed)
Pt in [facility] from [facility] [place] c/o left shoulder pain after fall 4 days ago. Pt was seen [facility] at [facility] of fall but did not complain of shoulder pain. Staff told ems pt c/o pain with movement of shoulder. Pt denies pain on triage. Pt has history of dementia at baseline per ems.

## 2017-09-16 NOTE — Progress Notes (Signed)
Visited with patient and husband.  Patient does not engage in conversation at all.  Husband states they live at [facility] in [place].  States patient is an assisted living patient and he is her 'companion'.  States she is wheelchair bound and requires assistance with all activities of daily living. Chart screened by case manager for discharge planning.  No needs identified at this time.  Please consult case manager if any new issues arise.

## 2018-09-21 IMAGING — CT CT CERVICAL SPINE W/O CM
3 of 4 series · 13 of 33 positions shown, 16 images · non-contrast
Comparison: None available.

CLINICAL DATA: Initial evaluation for acute trauma, fall.

EXAM:
CT CERVICAL SPINE WITHOUT CONTRAST
TECHNIQUE: Multidetector CT imaging of the cervical spine was performed without
intravenous contrast. Multiplanar CT image reconstructions were also
generated.

[Series 4: c_spine 2.0 st · axial · 0.27mm/px · z∈[-190,-72]mm · 5 of 89 slices shown, 7 images]
[im 15/89  soft-tissue]
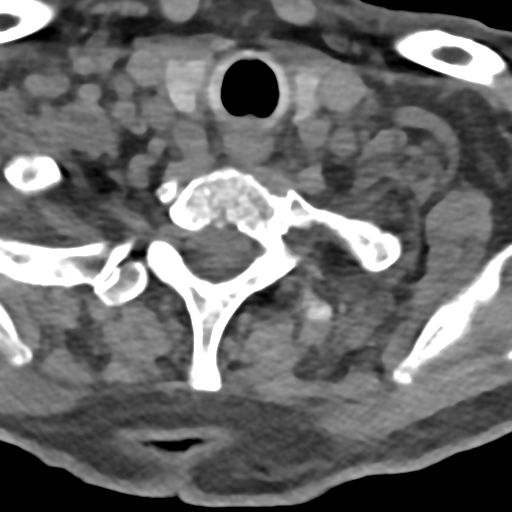
[im 15/89  bone]
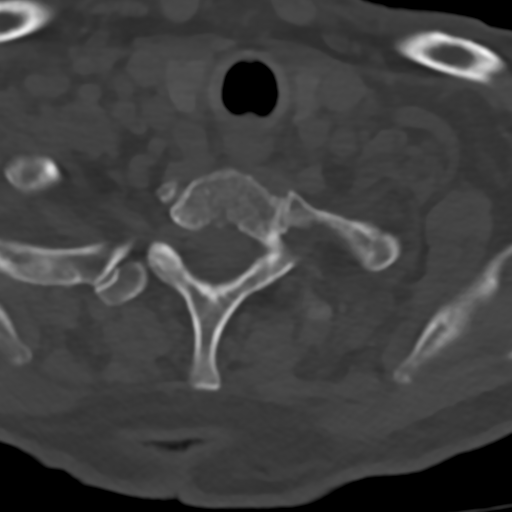
[im 30/89  bone]
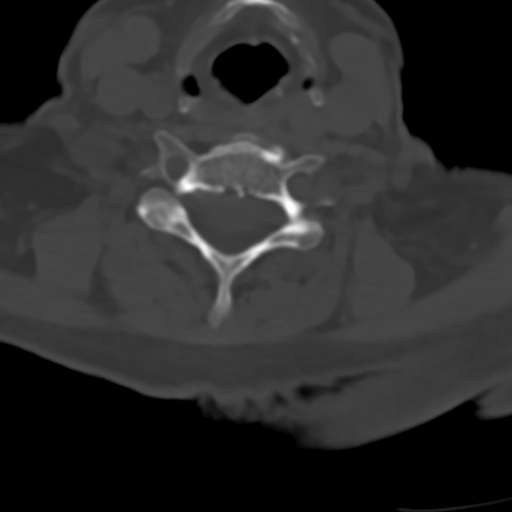
[im 45/89  bone]
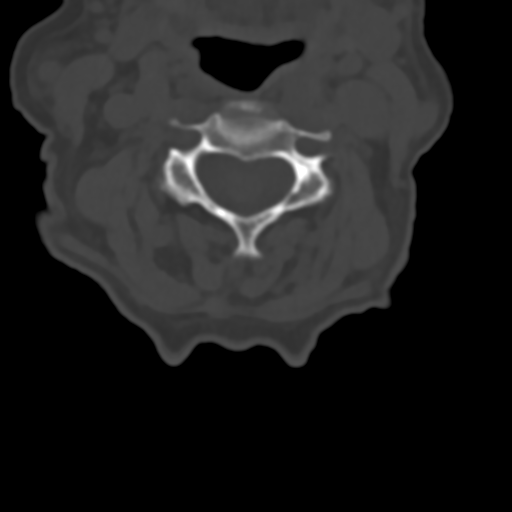
[im 59/89  bone]
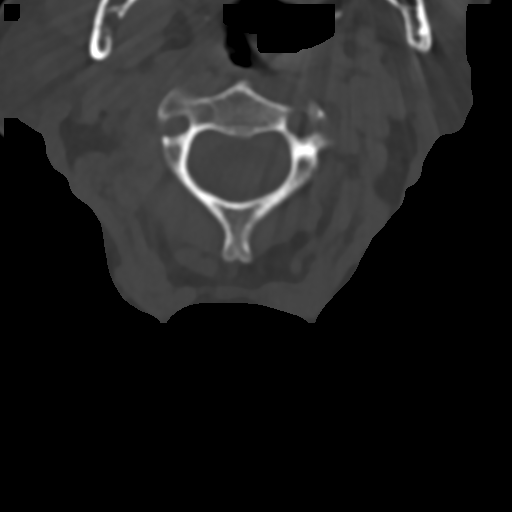
[im 74/89  soft-tissue]
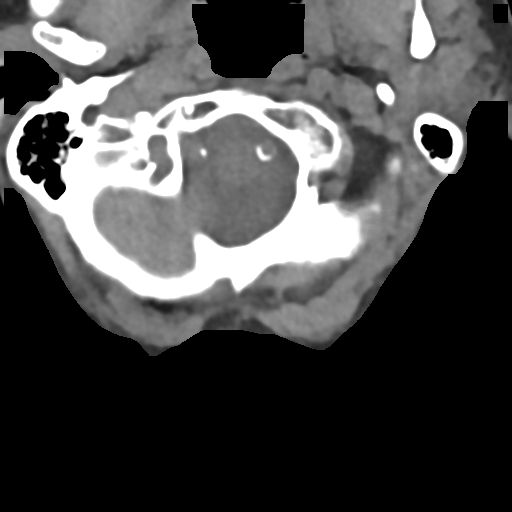
[im 74/89  bone]
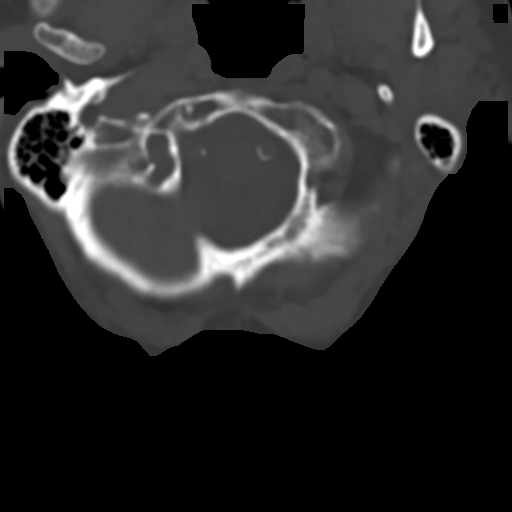

[Series 8: c_spine 2.0 sag bone · sagittal · 0.26mm/px · 5 of 44 slices shown, 6 images]
[im 15/44  bone]
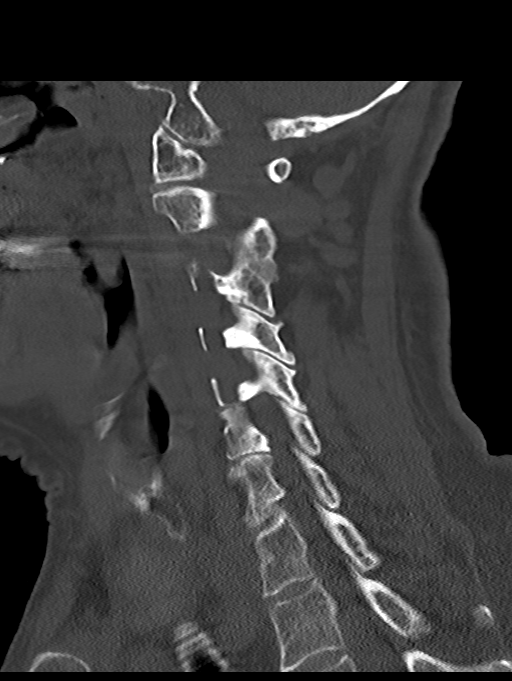
[im 18/44  bone]
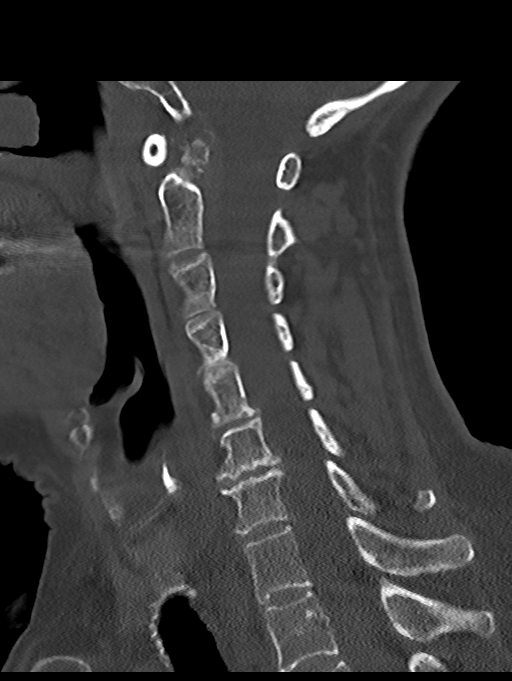
[im 22/44  soft-tissue]
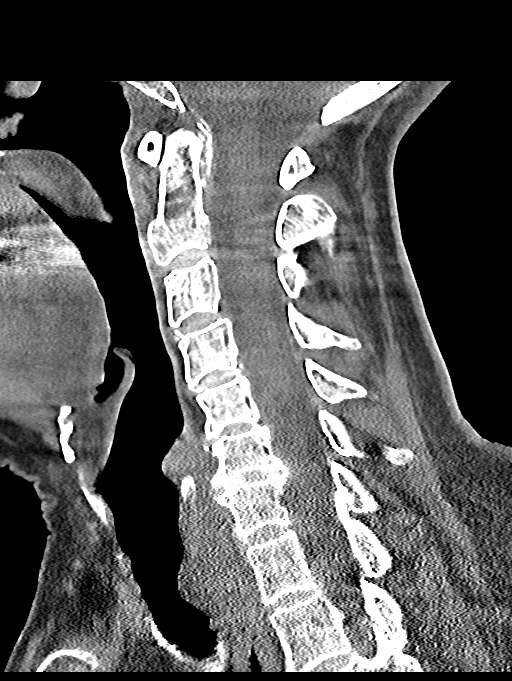
[im 22/44  bone]
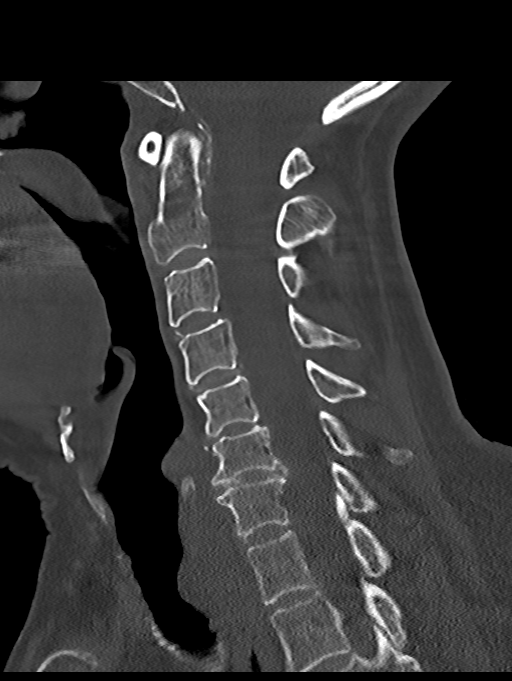
[im 26/44  bone]
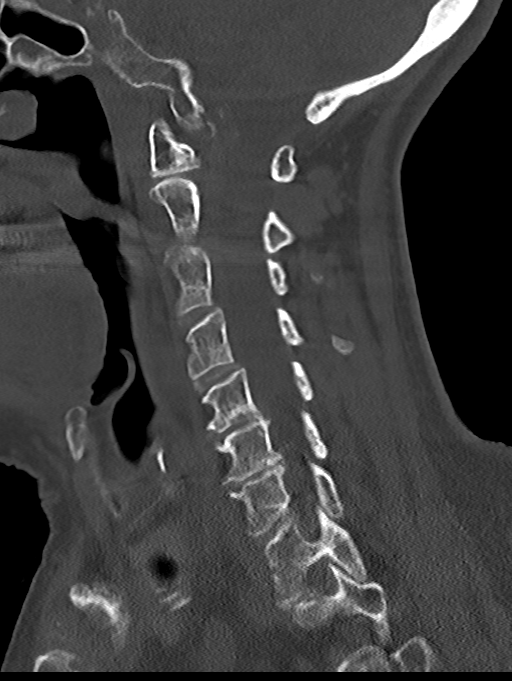
[im 29/44  bone]
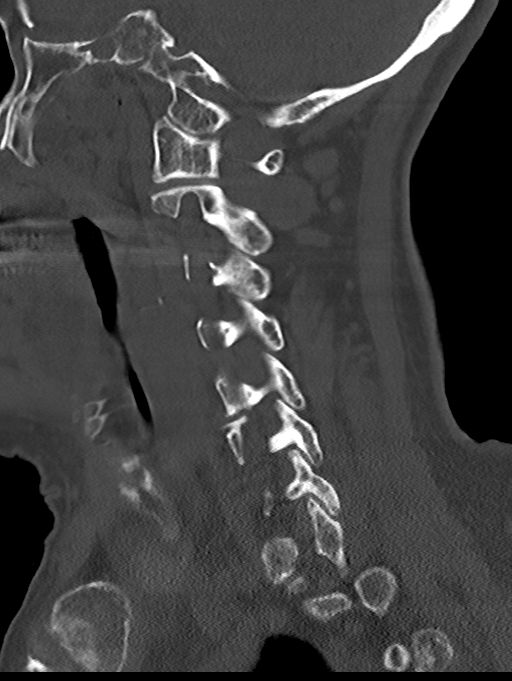

[Series 9: c_spine 2.0 cor bone · coronal · 0.26mm/px · 3 of 45 slices shown]
[im 9/45  bone]
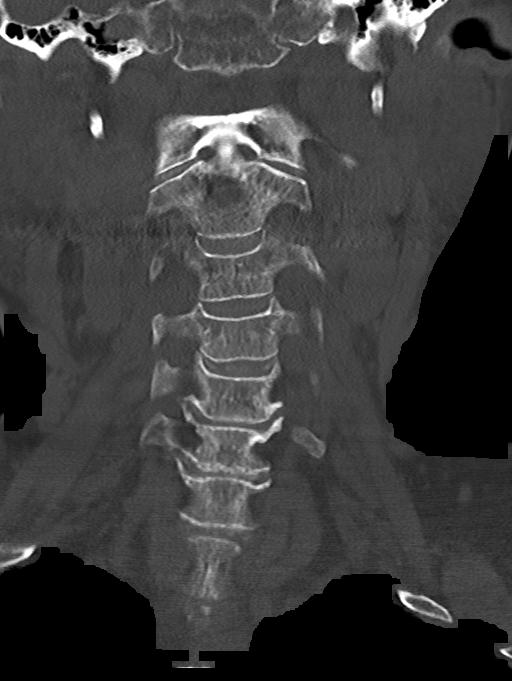
[im 18/45  bone]
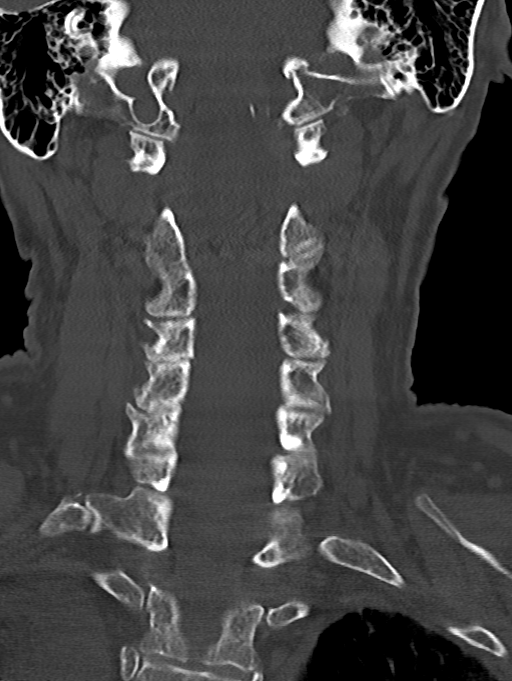
[im 27/45  bone]
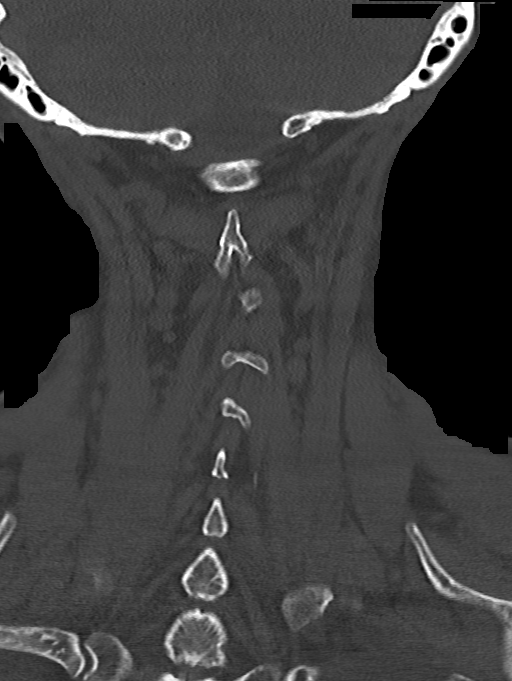

[13 of 33 positions shown; findings below may reference images not displayed]

FINDINGS: Alignment: Mild straightening of the normal cervical lordosis. Trace
anterolisthesis of C3 on C4 and C4 on C5.

Skull base and vertebrae: Skullbase intact. Normal C1-2
articulations preserved. Dens is intact. Vertebral body heights
maintained. No acute fracture.

Soft tissues and spinal canal: Visualized soft tissues of the neck
demonstrate no acute abnormality. No prevertebral edema. Prominent
vascular calcifications present at the carotid bifurcations.

Disc levels: Moderate degenerative spondylolysis noted at C6-7.
Multilevel facet arthrosis, most notable at C2-3 on the right.
Degenerative thickening noted at the tectorial membrane.

Upper chest: Visualized upper mediastinum within normal limits.
Bilateral pleural effusions partially visualized. Interlobular
septal thickening suggest edema.
IMPRESSION: 1. No acute traumatic injury within the cervical spine.
2. Large bilateral pleural effusions with probable pulmonary edema,
incompletely visualized.
3. Moderate degenerative spondylolysis at C6-7.

## 2018-12-05 DEATH — deceased
